# Patient Record
Sex: Male | Born: 1935 | Race: White | Hispanic: No | Smoking: Never smoker
Health system: Southern US, Community
[De-identification: ages and names within clinical notes are randomized; demographics above are authoritative.]

## PROBLEM LIST (undated history)

## (undated) DIAGNOSIS — H269 Unspecified cataract: Secondary | ICD-10-CM

## (undated) DIAGNOSIS — H409 Unspecified glaucoma: Secondary | ICD-10-CM

## (undated) DIAGNOSIS — C61 Malignant neoplasm of prostate: Secondary | ICD-10-CM

## (undated) DIAGNOSIS — R42 Dizziness and giddiness: Secondary | ICD-10-CM

## (undated) HISTORY — DX: Unspecified cataract: H26.9

## (undated) HISTORY — DX: Unspecified glaucoma: H40.9

## (undated) HISTORY — DX: Dizziness and giddiness: R42

## (undated) HISTORY — DX: Malignant neoplasm of prostate: C61

---

## 2005-05-29 HISTORY — PX: PROSTATE SURGERY: SHX751

## 2005-07-13 ENCOUNTER — Ambulatory Visit (HOSPITAL_COMMUNITY): Admission: RE | Admit: 2005-07-13 | Discharge: 2005-07-13 | Payer: Self-pay | Admitting: Urology

## 2005-08-21 ENCOUNTER — Ambulatory Visit: Admission: RE | Admit: 2005-08-21 | Discharge: 2005-11-19 | Payer: Self-pay | Admitting: Radiation Oncology

## 2005-09-20 ENCOUNTER — Encounter: Admission: RE | Admit: 2005-09-20 | Discharge: 2005-09-20 | Payer: Self-pay | Admitting: Urology

## 2005-09-27 ENCOUNTER — Ambulatory Visit (HOSPITAL_BASED_OUTPATIENT_CLINIC_OR_DEPARTMENT_OTHER): Admission: RE | Admit: 2005-09-27 | Discharge: 2005-09-27 | Payer: Self-pay | Admitting: Urology

## 2010-06-19 ENCOUNTER — Encounter: Payer: Self-pay | Admitting: Urology

## 2012-05-26 ENCOUNTER — Emergency Department (HOSPITAL_COMMUNITY)
Admission: EM | Admit: 2012-05-26 | Discharge: 2012-05-27 | Disposition: A | Discharge status: Home / Self Care | Payer: Medicare Other | Attending: Emergency Medicine | Admitting: Emergency Medicine

## 2012-05-26 ENCOUNTER — Emergency Department (HOSPITAL_COMMUNITY): Payer: Medicare Other

## 2012-05-26 DIAGNOSIS — W230XXA Caught, crushed, jammed, or pinched between moving objects, initial encounter: Secondary | ICD-10-CM | POA: Insufficient documentation

## 2012-05-26 DIAGNOSIS — S62639A Displaced fracture of distal phalanx of unspecified finger, initial encounter for closed fracture: Secondary | ICD-10-CM | POA: Insufficient documentation

## 2012-05-26 DIAGNOSIS — Z23 Encounter for immunization: Secondary | ICD-10-CM | POA: Insufficient documentation

## 2012-05-26 DIAGNOSIS — Y9389 Activity, other specified: Secondary | ICD-10-CM | POA: Insufficient documentation

## 2012-05-26 DIAGNOSIS — Y929 Unspecified place or not applicable: Secondary | ICD-10-CM | POA: Insufficient documentation

## 2012-05-26 DIAGNOSIS — S61209A Unspecified open wound of unspecified finger without damage to nail, initial encounter: Secondary | ICD-10-CM | POA: Insufficient documentation

## 2012-05-26 MED ORDER — TETANUS-DIPHTH-ACELL PERTUSSIS 5-2.5-18.5 LF-MCG/0.5 IM SUSP
0.5000 mL | Freq: Once | INTRAMUSCULAR | Status: AC
  Administered 2012-05-27: 0.5 mL via INTRAMUSCULAR
  Filled 2012-05-26: qty 0.5

## 2012-05-26 NOTE — ED Notes (Signed)
Pt states he slammed his 3rd finger on R hand in car door at 2045. Pt has lac to finger. Bleeding controlled.

## 2012-05-27 NOTE — ED Provider Notes (Signed)
History     CSN: [id number]  Arrival date & time 05/26/12  2203   First MD Initiated Contact with Patient 05/26/12 2239      Chief Complaint  Patient presents with  . Laceration    (Consider location/radiation/quality/duration/timing/severity/associated sxs/prior treatment) HPI Comments: Patient is a 76 year old male who presents with a right middle finger laceration that occurred earlier this evening when he slammed his finger in a car door by accident. Patient had immediate onset of dull, moderate pain to the affected finger that does not radiate. Pain is constant. Patient reports an associated laceration to the affected finger. Patient bandaged the finger. Denies numbness/tingling, weakness. No aggravating/alleviating factors.     No past medical history on file.  No past surgical history on file.  No family history on file.  History  Substance Use Topics  . Smoking status: Not on file  . Smokeless tobacco: Not on file  . Alcohol Use: Not on file      Review of Systems  Skin: Positive for wound.  All other systems reviewed and are negative.    Allergies  Review of patient's allergies indicates no known allergies.  Home Medications   Current Outpatient Rx  Name  Route  Sig  Dispense  Refill  . ALFALFA PO   Oral   Take 1 capsule by mouth every morning.         Marland Kitchen BRIMONIDINE TARTRATE-TIMOLOL 0.2-0.5 % OP SOLN   Both Eyes   Place 1 drop into both eyes every 12 (twelve) hours.         Marland Kitchen DANDELION PO   Oral   Take 1 capsule by mouth every morning.         Marland Kitchen GARLIC PO   Oral   Take 1 capsule by mouth 3 (three) times daily before meals.         Marland Kitchen MILK THISTLE PO   Oral   Take 1 capsule by mouth every morning.         . ADULT MULTIVITAMIN W/MINERALS CH   Oral   Take 1 tablet by mouth daily.         Marland Kitchen OVER THE COUNTER MEDICATION   Oral   Take 1 capsule by mouth every morning. OTC supplement Pyginal (antioxidant)         . OVER THE  COUNTER MEDICATION   Oral   Take 1 capsule by mouth every morning. OTC acai supplement         . OVER THE COUNTER MEDICATION   Oral   Take 1 capsule by mouth every morning. OTC tumeric supplement           BP 152/76  Pulse 72  Temp 98.4 F (36.9 C) (Oral)  Resp 16  SpO2 98%  Physical Exam  Nursing note and vitals reviewed. Constitutional: He appears well-developed and well-nourished. No distress.  HENT:  Head: Normocephalic and atraumatic.  Eyes: Conjunctivae normal are normal.  Neck: Normal range of motion. Neck supple.  Cardiovascular: Normal rate and regular rhythm.  Exam reveals no gallop and no friction rub.   No murmur heard. Pulmonary/Chest: Effort normal and breath sounds normal. He has no wheezes. He has no rales. He exhibits no tenderness.  Abdominal: Soft. There is no tenderness.  Musculoskeletal: Normal range of motion.       Full ROM of all joints of right middle finger.   Neurological: He is alert.       Strength and sensation intact  to right middle finger. Speech is goal-oriented. Moves limbs without ataxia.   Skin: Skin is warm and dry.       1.5cm laceration to volar aspect of right middle finger. Bleeding controlled.   Psychiatric: He has a normal mood and affect. His behavior is normal.    ED Course  Procedures (including critical care time)  LACERATION REPAIR Performed by: Emilia Beck Authorized by: Emilia Beck Consent: Verbal consent obtained. Risks and benefits: risks, benefits and alternatives were discussed Consent given by: patient Patient identity confirmed: provided demographic data Prepped and Draped in normal sterile fashion Wound explored  Laceration Location: right middle finger  Laceration Length: 1.5 cm  No Foreign Bodies seen or palpated  Anesthesia: local infiltration  Local anesthetic: lidocaine 2% without epinephrine  Anesthetic total: 1 ml  Irrigation method: syringe Amount of cleaning:  standard  Skin closure: 5-0 prolene  Number of sutures: 2  Technique: simple  Patient tolerance: Patient tolerated the procedure well with no immediate complications.   Labs Reviewed - No data to display Dg Finger Middle Right  05/26/2012  *RADIOLOGY REPORT*  Clinical Data: Shut finger in car door.  Finger pain and swelling.  RIGHT MIDDLE FINGER 2+V  Comparison: None.  Findings: Soft tissue swelling laceration is seen overlying the distal phalanx.  No radiopaque foreign body identified.  There is a linear lucency through the tuft of the distal phalanx seen only on the lateral projection, suspicious for nondisplaced fracture.  IMPRESSION: Probable nondisplaced fracture of the terminal tuft of the distal phalanx.   Original Report Authenticated By: Myles Rosenthal, M.D.      1. Closed fracture of tuft of distal phalanx of finger       MDM  12:08 AM Laceration repaired without difficulty. Terminal tuft fracture. Patient will have Hand follow up. Patient should return in 1 week for suture removal. No further evaluation needed at this time.        Emilia Beck, [place] 05/28/12 [phone]

## 2012-05-30 NOTE — ED Provider Notes (Signed)
Medical screening examination/treatment/procedure(s) were performed by non-physician practitioner and as supervising physician I was immediately available for consultation/collaboration.  Martha K Linker, MD 05/30/12 0705 

## 2013-04-09 ENCOUNTER — Ambulatory Visit (INDEPENDENT_AMBULATORY_CARE_PROVIDER_SITE_OTHER): Payer: Medicare Other | Admitting: Family Medicine

## 2013-04-09 VITALS — BP 168/70 | HR 71 | Temp 98.0°F | Resp 16 | Ht 70.0 in | Wt 177.0 lb

## 2013-04-09 DIAGNOSIS — E785 Hyperlipidemia, unspecified: Secondary | ICD-10-CM

## 2013-04-09 DIAGNOSIS — H409 Unspecified glaucoma: Secondary | ICD-10-CM

## 2013-04-09 LAB — LIPID PANEL
Cholesterol: 259 mg/dL — ABNORMAL HIGH (ref 0–200)
HDL: 44 mg/dL (ref >39)
LDL Cholesterol: 171 mg/dL — ABNORMAL HIGH (ref 0–99)
Total CHOL/HDL Ratio: 5.9 Ratio
Triglycerides: 218 mg/dL — ABNORMAL HIGH (ref <150)
VLDL: 44 mg/dL — ABNORMAL HIGH (ref 0–40)

## 2013-04-09 LAB — COMPREHENSIVE METABOLIC PANEL
ALT: 16 U/L (ref 0–53)
AST: 21 U/L (ref 0–37)
Albumin: 4.5 g/dL (ref 3.5–5.2)
Alkaline Phosphatase: 94 U/L (ref 39–117)
BUN: 10 mg/dL (ref 6–23)
CO2: 24 mEq/L (ref 19–32)
Calcium: 9.6 mg/dL (ref 8.4–10.5)
Chloride: 104 mEq/L (ref 96–112)
Creat: 0.84 mg/dL (ref 0.50–1.35)
Glucose, Bld: 109 mg/dL — ABNORMAL HIGH (ref 70–99)
Potassium: 3.9 mEq/L (ref 3.5–5.3)
Sodium: 138 mEq/L (ref 135–145)
Total Bilirubin: 0.8 mg/dL (ref 0.3–1.2)
Total Protein: 7.4 g/dL (ref 6.0–8.3)

## 2013-04-09 LAB — POCT CBC
Granulocyte percent: 63.5 %G (ref 37–80)
HCT, POC: 45.6 % (ref 43.5–53.7)
Hemoglobin: 14.3 g/dL (ref 14.1–18.1)
Lymph, poc: 2.2 (ref 0.6–3.4)
MCH, POC: 32 pg — AB (ref 27–31.2)
MCHC: 31.4 g/dL — AB (ref 31.8–35.4)
MCV: 102 fL — AB (ref 80–97)
MID (cbc): 0.5 (ref 0–0.9)
MPV: 9.1 fL (ref 0–99.8)
POC Granulocyte: 4.8 (ref 2–6.9)
POC LYMPH PERCENT: 29.9 %L (ref 10–50)
POC MID %: 6.6 %M (ref 0–12)
Platelet Count, POC: 204 10*3/uL (ref 142–424)
RBC: 4.47 M/uL — AB (ref 4.69–6.13)
RDW, POC: 14.3 %
WBC: 7.5 10*3/uL (ref 4.6–10.2)

## 2013-04-09 NOTE — Progress Notes (Signed)
@UMFCLOGO @  This chart was scribed for [name], MD by Quintella Reichert, ED scribe.  This patient was seen in room [facility] Room 9 and the patient's care was started at 9:42 AM.  Patient ID: Guy Hutchinson MRN: [id number], DOB: Feb 08, 1936, 77 y.o. Date of Encounter: 04/09/2013, 9:41 AM  Primary Physician: No PCP Per Patient  Chief Complaint: Requests CBC  HPI: 77 y.o. year old male with history below presents for a CBC.  He works part-time at [employer].  Pt states he wants to have his CBC checked as a regular check-up.  He does not have any complaints at this time.  He states he has been diagnosed with high cholesterol.  He only medicates regularly with "herbs."  He states he has been taking some stool softeners recently "maybe because I ate too much fiber."  He denies any other recent GI issues.  He has had a colonoscopy.     History reviewed. No pertinent past medical history.   Home Meds: Prior to Admission medications   Medication Sig Start Date End Date Taking? Authorizing Provider  ALFALFA PO Take 1 capsule by mouth every morning.   Yes Historical Provider, MD  brimonidine-timolol (COMBIGAN) 0.2-0.5 % ophthalmic solution Place 1 drop into both eyes every 12 (twelve) hours.   Yes Historical Provider, MD  DANDELION PO Take 1 capsule by mouth every morning.   Yes Historical Provider, MD  GARLIC PO Take 1 capsule by mouth 3 (three) times daily before meals.   Yes Historical Provider, MD  MILK THISTLE PO Take 1 capsule by mouth every morning.   Yes Historical Provider, MD  Multiple Vitamin (MULTIVITAMIN WITH MINERALS) TABS Take 1 tablet by mouth daily.   Yes Historical Provider, MD  OVER THE COUNTER MEDICATION Take 1 capsule by mouth every morning. OTC supplement Pyginal (antioxidant)   Yes Historical Provider, MD  OVER THE COUNTER MEDICATION Take 1 capsule by mouth every morning. OTC acai supplement   Yes Historical Provider, MD  OVER THE COUNTER MEDICATION Take 1 capsule  by mouth every morning. OTC tumeric supplement   Yes Historical Provider, MD    Allergies: No Known Allergies  History   Social History  . Marital Status: Married    Spouse Name: N/A    Number of Children: N/A  . Years of Education: N/A   Occupational History  . Not on file.   Social History Main Topics  . Smoking status: Never Smoker   . Smokeless tobacco: Not on file  . Alcohol Use: No  . Drug Use: No  . Sexual Activity: Not on file   Other Topics Concern  . Not on file   Social History Narrative  . No narrative on file     Review of Systems: Constitutional: negative for chills, fever, night sweats, weight changes, or fatigue  HEENT: negative for vision changes, hearing loss, congestion, rhinorrhea, ST, epistaxis, or sinus pressure Cardiovascular: negative for chest pain or palpitations Respiratory: negative for hemoptysis, wheezing, shortness of breath, or cough Abdominal: negative for abdominal pain, nausea, vomiting, diarrhea, or constipation Dermatological: negative for rash Neurologic: negative for headache, dizziness, or syncope All other systems reviewed and are otherwise negative with the exception to those above and in the HPI.   Physical Exam: Blood pressure 168/70, pulse 71, temperature 98 F (36.7 C), temperature source Oral, resp. rate 16, height 5\' 10"  (1.778 m), weight 177 lb (80.287 kg), SpO2 97.00%., Body mass index is 25.4 kg/(m^2). General: Well developed, well nourished, in  no acute distress. Head: Normocephalic, atraumatic, eyes without discharge, sclera non-icteric, nares are without discharge. Bilateral auditory canals clear, TM's are without perforation, pearly grey and translucent with reflective cone of light bilaterally. Oral cavity moist, posterior pharynx without exudate, erythema, peritonsillar abscess, or post nasal drip.  Neck: Supple. No thyromegaly. Full ROM. No lymphadenopathy. Lungs: Clear bilaterally to auscultation without  wheezes, rales, or rhonchi. Breathing is unlabored. Heart: RRR with S1 S2. No murmurs, rubs, or gallops appreciated. Abdomen: Soft, non-tender, non-distended with normoactive bowel sounds. No hepatomegaly. No rebound/guarding. No obvious abdominal masses. Msk:  Strength and tone normal for age. Extremities/Skin: Warm and dry. No clubbing or cyanosis. No edema. No rashes or suspicious lesions. Neuro: Alert and oriented X 3. Moves all extremities spontaneously. Gait is normal. CNII-XII grossly in tact. Psych:  Responds to questions appropriately with a normal affect.     ASSESSMENT AND PLAN:  77 y.o. year old male with Other and unspecified hyperlipidemia - Plan: POCT CBC, Comprehensive metabolic panel, Lipid panel  Glaucoma - Plan: POCT CBC, Comprehensive metabolic panel, Lipid panel     Signed, [name], MD 04/09/2013 9:41 AM

## 2013-04-10 ENCOUNTER — Encounter: Payer: Self-pay | Admitting: Family Medicine

## 2013-04-11 ENCOUNTER — Telehealth: Payer: Self-pay

## 2013-04-11 NOTE — Telephone Encounter (Signed)
I left the lyrics for the song in an envelope with all his lab results at the front desk

## 2013-04-11 NOTE — Telephone Encounter (Signed)
Patient has abnormal lab values. We need to reduce the cholesterol to reduce risk of vascular disease. Let me know if patient willing to start cholesterol lowering drug, or whether he prefers to work on weight and exercise, patient states he wants to work on diet/ exercise and he is going to resume his red yeast rice. He is asking for song lyrics you said you would print for him, old hymns I think

## 2013-04-11 NOTE — Telephone Encounter (Signed)
Pt is calling back from missed call from the lab about lab results Call back number is [phone]

## 2013-04-14 NOTE — Telephone Encounter (Signed)
Thanks. Left message to advise.

## 2014-01-15 ENCOUNTER — Ambulatory Visit (INDEPENDENT_AMBULATORY_CARE_PROVIDER_SITE_OTHER): Payer: Medicare Other

## 2014-01-15 ENCOUNTER — Ambulatory Visit (INDEPENDENT_AMBULATORY_CARE_PROVIDER_SITE_OTHER): Payer: Medicare Other | Admitting: Family Medicine

## 2014-01-15 VITALS — BP 172/72 | HR 70 | Temp 98.1°F | Resp 16 | Ht 68.0 in | Wt 172.8 lb

## 2014-01-15 DIAGNOSIS — M79661 Pain in right lower leg: Secondary | ICD-10-CM

## 2014-01-15 DIAGNOSIS — S8011XA Contusion of right lower leg, initial encounter: Secondary | ICD-10-CM

## 2014-01-15 DIAGNOSIS — S8010XA Contusion of unspecified lower leg, initial encounter: Secondary | ICD-10-CM

## 2014-01-15 DIAGNOSIS — M79609 Pain in unspecified limb: Secondary | ICD-10-CM

## 2014-01-15 NOTE — Patient Instructions (Addendum)
No fracture seen.  Swelling should improve with time. Return to the clinic or go to the nearest emergency room if any of your symptoms worsen or new symptoms occur.   Contusion A contusion is a deep bruise. Contusions are the result of an injury that caused bleeding under the skin. The contusion may turn blue, purple, or yellow. Minor injuries will give you a painless contusion, but more severe contusions may stay painful and swollen for a few weeks.  CAUSES  A contusion is usually caused by a blow, trauma, or direct force to an area of the body. SYMPTOMS   Swelling and redness of the injured area.  Bruising of the injured area.  Tenderness and soreness of the injured area.  Pain. DIAGNOSIS  The diagnosis can be made by taking a history and physical exam. An X-ray, CT scan, or MRI may be needed to determine if there were any associated injuries, such as fractures. TREATMENT  Specific treatment will depend on what area of the body was injured. In general, the best treatment for a contusion is resting, icing, elevating, and applying cold compresses to the injured area. Over-the-counter medicines may also be recommended for pain control. Ask your caregiver what the best treatment is for your contusion. HOME CARE INSTRUCTIONS   Put ice on the injured area.  Put ice in a plastic bag.  Place a towel between your skin and the bag.  Leave the ice on for 15-20 minutes, 3-4 times a day, or as directed by your health care provider.  Only take over-the-counter or prescription medicines for pain, discomfort, or fever as directed by your caregiver. Your caregiver may recommend avoiding anti-inflammatory medicines (aspirin, ibuprofen, and naproxen) for 48 hours because these medicines may increase bruising.  Rest the injured area.  If possible, elevate the injured area to reduce swelling. SEEK IMMEDIATE MEDICAL CARE IF:   You have increased bruising or swelling.  You have pain that is getting  worse.  Your swelling or pain is not relieved with medicines. MAKE SURE YOU:   Understand these instructions.  Will watch your condition.  Will get help right away if you are not doing well or get worse. Document Released: 02/22/2005 Document Revised: 05/20/2013 Document Reviewed: 03/20/2011 [facility] Patient Information 2015 [place], [place]. This information is not intended to replace advice given to you by your health care provider. Make sure you discuss any questions you have with your health care provider.

## 2014-01-15 NOTE — Progress Notes (Addendum)
Subjective:  This chart was scribed for Guy Ray, MD by Roxan Diesel, Scribe.  This patient was seen in [place] [facility] and the patient's care was started at 11:13 AM.   Patient ID: Guy Hutchinson, male    DOB: November 17, 1935, 78 y.o.   MRN: [id number]  Chief Complaint  Patient presents with  . Leg Injury    right lower leg, from tripping, edema, pain     HPI  Guy Hutchinson is a 78 y.o. male PCP: No PCP Per Patient   Pt presents for a right lower leg injury sustained 6 days ago.  He states he was coming off of a dance floor when he bumped his right lower leg against a ledge.  He stayed there and danced for another hour and placed ice on the leg when he got home and noticed pain and swelling to the area.  He states he has been using arnica gel and ice since then but his swelling and pain have persisted.  He states his swelling goes up and down.  He had some bruising earlier but it seems to have improved.  He is able to bear weight on the ankle.  He is not on blood-thinners.  There are no active problems to display for this patient.   No past medical history on file.  No past surgical history on file.  No Known Allergies  Prior to Admission medications   Medication Sig Start Date End Date Taking? Authorizing Provider  ALFALFA PO Take 1 capsule by mouth every morning.   Yes Historical Provider, MD  brimonidine-timolol (COMBIGAN) 0.2-0.5 % ophthalmic solution Place 1 drop into both eyes every 12 (twelve) hours.   Yes Historical Provider, MD  DANDELION PO Take 1 capsule by mouth every morning.   Yes Historical Provider, MD  GARLIC PO Take 1 capsule by mouth 3 (three) times daily before meals.   Yes Historical Provider, MD  MILK THISTLE PO Take 1 capsule by mouth every morning.   Yes Historical Provider, MD  Multiple Vitamin (MULTIVITAMIN WITH MINERALS) TABS Take 1 tablet by mouth daily.   Yes Historical Provider, MD  OVER THE COUNTER MEDICATION Take 1 capsule by mouth every  morning. OTC supplement Pyginal (antioxidant)   Yes Historical Provider, MD  OVER THE COUNTER MEDICATION Take 1 capsule by mouth every morning. OTC acai supplement   Yes Historical Provider, MD  OVER THE COUNTER MEDICATION Take 1 capsule by mouth every morning. OTC tumeric supplement   Yes Historical Provider, MD    History   Social History  . Marital Status: Married    Spouse Name: N/A    Number of Children: N/A  . Years of Education: N/A   Occupational History  . Not on file.   Social History Main Topics  . Smoking status: Never Smoker   . Smokeless tobacco: Not on file  . Alcohol Use: No  . Drug Use: No  . Sexual Activity: Not on file   Other Topics Concern  . Not on file   Social History Narrative  . No narrative on file     Review of Systems  Musculoskeletal: Positive for arthralgias (right ankle) and joint swelling (right ankle).  Hematological: Does not bruise/bleed easily.        Objective:   Physical Exam  Nursing note and vitals reviewed. Constitutional: He is oriented to person, place, and time. He appears well-developed and well-nourished. No distress.  HENT:  Head: Normocephalic and atraumatic.  Eyes:  Conjunctivae and EOM are normal.  Neck: Neck supple. No tracheal deviation present.  Cardiovascular: Normal rate.   Pulmonary/Chest: Effort normal. No respiratory distress.  Musculoskeletal: Normal range of motion.  Full ROM in right ankle.  No medial or lateral malleolar tenderness. Some tenderness along the distal fibula, with prominent soft tissue swelling at the distal third of the fibula.  Firm area about 10 cm up from the ankle with some faded ecchymosis distally.  NVI distally.  Neurological: He is alert and oriented to person, place, and time.  Skin: Skin is warm and dry.  Psychiatric: He has a normal mood and affect. His behavior is normal.     BP 172/72  Pulse 70  Temp(Src) 98.1 F (36.7 C) (Oral)  Resp 16  Ht 5\' 8"  (1.727 m)  Wt 172  lb 12.8 oz (78.382 kg)  BMI 26.28 kg/m2  SpO2 97%  [facility] reading (PRIMARY) by  Dr. Carlota Raspberry: R tib-fib:sts over distal 1/3rd fibula without apparent acute bony findings.       Assessment & Plan:  Guy Hutchinson is a 78 y.o. male Pain of right lower leg - Plan: DG Tibia/Fibula Right  Contusion of leg, right, initial encounter - Plan: DG Tibia/Fibula Right  Soft tissue contusion over distal leg, no pain with ambulation. Sx care discussed and rtc precautions given.    No orders of the defined types were placed in this encounter.   Patient Instructions  No fracture seen.  Swelling should improve with time. Return to the clinic or go to the nearest emergency room if any of your symptoms worsen or new symptoms occur.   Contusion A contusion is a deep bruise. Contusions are the result of an injury that caused bleeding under the skin. The contusion may turn blue, purple, or yellow. Minor injuries will give you a painless contusion, but more severe contusions may stay painful and swollen for a few weeks.  CAUSES  A contusion is usually caused by a blow, trauma, or direct force to an area of the body. SYMPTOMS   Swelling and redness of the injured area.  Bruising of the injured area.  Tenderness and soreness of the injured area.  Pain. DIAGNOSIS  The diagnosis can be made by taking a history and physical exam. An X-Hutchinson, CT scan, or MRI may be needed to determine if there were any associated injuries, such as fractures. TREATMENT  Specific treatment will depend on what area of the body was injured. In general, the best treatment for a contusion is resting, icing, elevating, and applying cold compresses to the injured area. Over-the-counter medicines may also be recommended for pain control. Ask your caregiver what the best treatment is for your contusion. HOME CARE INSTRUCTIONS   Put ice on the injured area.  Put ice in a plastic bag.  Place a towel between your skin and the  bag.  Leave the ice on for 15-20 minutes, 3-4 times a day, or as directed by your health care provider.  Only take over-the-counter or prescription medicines for pain, discomfort, or fever as directed by your caregiver. Your caregiver may recommend avoiding anti-inflammatory medicines (aspirin, ibuprofen, and naproxen) for 48 hours because these medicines may increase bruising.  Rest the injured area.  If possible, elevate the injured area to reduce swelling. SEEK IMMEDIATE MEDICAL CARE IF:   You have increased bruising or swelling.  You have pain that is getting worse.  Your swelling or pain is not relieved with medicines. MAKE SURE YOU:  Understand these instructions.  Will watch your condition.  Will get help right away if you are not doing well or get worse. Document Released: 02/22/2005 Document Revised: 05/20/2013 Document Reviewed: 03/20/2011 [facility] Patient Information 2015 [place], [place]. This information is not intended to replace advice given to you by your health care provider. Make sure you discuss any questions you have with your health care provider.        I personally performed the services described in this documentation, which was scribed in my presence. The recorded information has been reviewed and considered, and addended by me as needed.

## 2014-06-23 DIAGNOSIS — H4011X1 Primary open-angle glaucoma, mild stage: Secondary | ICD-10-CM | POA: Diagnosis not present

## 2014-09-25 DIAGNOSIS — H4011X1 Primary open-angle glaucoma, mild stage: Secondary | ICD-10-CM | POA: Diagnosis not present

## 2014-10-02 DIAGNOSIS — H1013 Acute atopic conjunctivitis, bilateral: Secondary | ICD-10-CM | POA: Diagnosis not present

## 2014-10-07 ENCOUNTER — Encounter: Payer: Self-pay | Admitting: *Deleted

## 2014-10-14 ENCOUNTER — Telehealth: Payer: Self-pay | Admitting: *Deleted

## 2014-10-14 NOTE — Telephone Encounter (Signed)
Patient phoned in response to my letter to schedule AWV.  Preferred for [facility] to be listed as his PCP (had also asked for PCP clarification).  Explained present circumstances and he said he had seen Dr. Carlota Raspberry as well in the past and said that was okay.  AWV scheduled, but patient stated he may go to walk in to see Dr. Joseph Art, if only to get his CBC drawn.  Patient knows to be fasting and plans (if he keeps the scheduled OV), to come early in the morning for labs and then come back.  If he goes to walk in, he will call me back and cancel OV with Carlota Raspberry.

## 2014-10-16 ENCOUNTER — Ambulatory Visit (INDEPENDENT_AMBULATORY_CARE_PROVIDER_SITE_OTHER): Payer: Medicare Other | Admitting: Family Medicine

## 2014-10-16 VITALS — BP 134/72 | HR 86 | Temp 98.1°F | Resp 17 | Ht 68.0 in | Wt 176.0 lb

## 2014-10-16 DIAGNOSIS — D7589 Other specified diseases of blood and blood-forming organs: Secondary | ICD-10-CM

## 2014-10-16 DIAGNOSIS — H6523 Chronic serous otitis media, bilateral: Secondary | ICD-10-CM | POA: Diagnosis not present

## 2014-10-16 LAB — POCT CBC
Granulocyte percent: 68.4 %G (ref 37–80)
HCT, POC: 45.6 % (ref 43.5–53.7)
Hemoglobin: 15.2 g/dL (ref 14.1–18.1)
Lymph, poc: 2.7 (ref 0.6–3.4)
MCH, POC: 31.7 pg — AB (ref 27–31.2)
MCHC: 33.4 g/dL (ref 31.8–35.4)
MCV: 94.7 fL (ref 80–97)
MID (cbc): 0.9 (ref 0–0.9)
MPV: 7.1 fL (ref 0–99.8)
POC Granulocyte: 7.9 — AB (ref 2–6.9)
POC LYMPH PERCENT: 23.6 %L (ref 10–50)
POC MID %: 8 %M (ref 0–12)
Platelet Count, POC: 274 10*3/uL (ref 142–424)
RBC: 4.82 M/uL (ref 4.69–6.13)
RDW, POC: 13.5 %
WBC: 11.5 10*3/uL — AB (ref 4.6–10.2)

## 2014-10-16 MED ORDER — AMOXICILLIN 875 MG PO TABS: 875.0000 mg | ORAL_TABLET | Freq: Two times a day (BID) | ORAL | Status: DC

## 2014-10-16 NOTE — Progress Notes (Signed)
Subjective:    Patient ID: Guy Hutchinson, male    DOB: 1936-02-19, 79 y.o.   MRN: [id number] This chart was scribed for Robyn Haber, MD by Steva Colder, ED Scribe. The patient was seen in room 2 at 11:44 AM.   Chief Complaint  Patient presents with   cbc     yearly check     HPI  Guy Hutchinson is a 79 y.o. male who presents today complaining of a yearly check up. Pt reports that he began to have allergies and the ear pain began this morning. He states that he is having associated symptoms of ear pain and popping in the right ear. He states that he has tried hydrogen peroxide with no relief for his symptoms. He denies SOB, abdominal pain, and any other symtpoms.     There are no active problems to display for this patient.  History reviewed. No pertinent past medical history. Past Surgical History  Procedure Laterality Date   Prostate surgery  2007   No Known Allergies Prior to Admission medications   Medication Sig Start Date End Date Taking? Authorizing Provider  ALFALFA PO Take 1 capsule by mouth every morning.   Yes Historical Provider, MD  brimonidine-timolol (COMBIGAN) 0.2-0.5 % ophthalmic solution Place 1 drop into both eyes every 12 (twelve) hours.   Yes Historical Provider, MD  DANDELION PO Take 1 capsule by mouth every morning.   Yes Historical Provider, MD  GARLIC PO Take 1 capsule by mouth 3 (three) times daily before meals.   Yes Historical Provider, MD  MILK THISTLE PO Take 1 capsule by mouth every morning.   Yes Historical Provider, MD  Multiple Vitamin (MULTIVITAMIN WITH MINERALS) TABS Take 1 tablet by mouth daily.   Yes Historical Provider, MD  OVER THE COUNTER MEDICATION Take 1 capsule by mouth every morning. OTC supplement Pyginal (antioxidant)   Yes Historical Provider, MD  OVER THE COUNTER MEDICATION Take 1 capsule by mouth every morning. OTC acai supplement   Yes Historical Provider, MD  OVER THE COUNTER MEDICATION Take 1 capsule by mouth every  morning. OTC tumeric supplement   Yes Historical Provider, MD      Review of Systems  Constitutional: Negative for fever and chills.  HENT: Positive for ear pain. Negative for ear discharge.   Respiratory: Negative for shortness of breath.   Gastrointestinal: Negative for abdominal pain.       Objective:   Physical Exam  Constitutional: He is oriented to person, place, and time. He appears well-developed and well-nourished. No distress.  HENT:  Head: Normocephalic and atraumatic.  Both ears are opaque and wrinkled.  Eyes: EOM are normal.  Neck: Neck supple. No tracheal deviation present.  Cardiovascular: Normal rate, regular rhythm and normal heart sounds.  Exam reveals no gallop and no friction rub.   No murmur heard. Pulmonary/Chest: Effort normal and breath sounds normal. No respiratory distress. He has no wheezes. He has no rales.  Musculoskeletal: Normal range of motion.  Neurological: He is alert and oriented to person, place, and time.  Skin: Skin is warm and dry.  Psychiatric: He has a normal mood and affect. His behavior is normal.  Nursing note and vitals reviewed.        BP 134/72 mmHg   Pulse 86   Temp(Src) 98.1 F (36.7 C) (Oral)   Resp 17   Ht 5\' 8"  (1.727 m)   Wt 176 lb (79.833 kg)   BMI 26.77 kg/m2   SpO2  96%  Assessment & Plan:  DIAGNOSTIC STUDIES: Oxygen Saturation is 96% on RA, nl by my interpretation.    COORDINATION OF CARE: 11:50 AM-Discussed treatment plan which includes amoxicillin Rx with pt at bedside and pt agreed to plan.   This chart was scribed in my presence and reviewed by me personally.    ICD-9-CM ICD-10-CM   1. Macrocytosis without anemia 289.89 D75.89 POCT CBC     amoxicillin (AMOXIL) 875 MG tablet     CANCELED: Vitamin B12     CANCELED: Folate  2. Bilateral chronic serous otitis media 381.10 H65.23 amoxicillin (AMOXIL) 875 MG tablet     Signed, Robyn Haber, MD

## 2014-10-17 ENCOUNTER — Encounter: Payer: Self-pay | Admitting: Family Medicine

## 2014-10-17 LAB — VITAMIN B12: Vitamin B-12: 804 pg/mL (ref 211–911)

## 2014-10-17 LAB — FOLATE: Folate: >20 ng/mL

## 2014-10-22 ENCOUNTER — Telehealth: Payer: Self-pay

## 2014-10-22 ENCOUNTER — Other Ambulatory Visit: Payer: Self-pay | Admitting: Family Medicine

## 2014-10-22 DIAGNOSIS — E785 Hyperlipidemia, unspecified: Secondary | ICD-10-CM

## 2014-10-22 DIAGNOSIS — Z0000 Encounter for general adult medical examination without abnormal findings: Secondary | ICD-10-CM

## 2014-10-22 DIAGNOSIS — N401 Enlarged prostate with lower urinary tract symptoms: Secondary | ICD-10-CM

## 2014-10-22 DIAGNOSIS — R351 Nocturia: Secondary | ICD-10-CM

## 2014-10-22 DIAGNOSIS — H9190 Unspecified hearing loss, unspecified ear: Secondary | ICD-10-CM

## 2014-10-22 NOTE — Telephone Encounter (Signed)
Please run pended orders which EPIC will not let me sign.

## 2014-10-22 NOTE — Telephone Encounter (Signed)
Pt was wanting to know if we could check PSA, Lipid and CMP. When he came in to see you, he wanted a complete blood check, not just a blood count. Can we do a blood draw only

## 2014-10-23 ENCOUNTER — Telehealth: Payer: Self-pay

## 2014-10-23 NOTE — Telephone Encounter (Signed)
Pt states he was called in regards to lab results on 5/21, but he was really interested in his CBC results, which were not discussed. He states he has attempted to lower this with diet for the last year, and would really like to know what the result of that was. Please advise patient

## 2014-10-24 NOTE — Telephone Encounter (Signed)
Future orders signed. LMOM letting pt know he could come back in for a blood draw only. (see other phone message)

## 2014-10-26 ENCOUNTER — Ambulatory Visit (INDEPENDENT_AMBULATORY_CARE_PROVIDER_SITE_OTHER): Payer: Medicare Other | Admitting: Family Medicine

## 2014-10-26 VITALS — BP 142/78 | HR 75 | Temp 97.2°F | Resp 16 | Ht 68.5 in | Wt 178.0 lb

## 2014-10-26 DIAGNOSIS — R351 Nocturia: Secondary | ICD-10-CM

## 2014-10-26 DIAGNOSIS — H9190 Unspecified hearing loss, unspecified ear: Secondary | ICD-10-CM | POA: Diagnosis not present

## 2014-10-26 DIAGNOSIS — J301 Allergic rhinitis due to pollen: Secondary | ICD-10-CM | POA: Diagnosis not present

## 2014-10-26 DIAGNOSIS — N401 Enlarged prostate with lower urinary tract symptoms: Secondary | ICD-10-CM | POA: Diagnosis not present

## 2014-10-26 DIAGNOSIS — Z0000 Encounter for general adult medical examination without abnormal findings: Secondary | ICD-10-CM

## 2014-10-26 DIAGNOSIS — E785 Hyperlipidemia, unspecified: Secondary | ICD-10-CM | POA: Diagnosis not present

## 2014-10-26 DIAGNOSIS — H6506 Acute serous otitis media, recurrent, bilateral: Secondary | ICD-10-CM

## 2014-10-26 LAB — POCT CBC
GRANULOCYTE PERCENT: 57.6 % (ref 37–80)
HCT, POC: 43.4 % — AB (ref 43.5–53.7)
HEMOGLOBIN: 14.5 g/dL (ref 14.1–18.1)
Lymph, poc: 2.4 (ref 0.6–3.4)
MCH, POC: 31.7 pg — AB (ref 27–31.2)
MCHC: 33.5 g/dL (ref 31.8–35.4)
MCV: 94.6 fL (ref 80–97)
MID (cbc): 0.7 (ref 0–0.9)
MPV: 7.3 fL (ref 0–99.8)
POC Granulocyte: 4.3 (ref 2–6.9)
POC LYMPH PERCENT: 32.9 %L (ref 10–50)
POC MID %: 9.5 % (ref 0–12)
Platelet Count, POC: 238 10*3/uL (ref 142–424)
RBC: 4.59 M/uL — AB (ref 4.69–6.13)
RDW, POC: 13.6 %
WBC: 7.4 10*3/uL (ref 4.6–10.2)

## 2014-10-26 LAB — LIPID PANEL
Cholesterol: 229 mg/dL — ABNORMAL HIGH (ref 0–200)
HDL: 44 mg/dL (ref >=40)
LDL Cholesterol: 149 mg/dL — ABNORMAL HIGH (ref 0–99)
Total CHOL/HDL Ratio: 5.2 Ratio
Triglycerides: 178 mg/dL — ABNORMAL HIGH (ref <150)
VLDL: 36 mg/dL (ref 0–40)

## 2014-10-26 LAB — COMPLETE METABOLIC PANEL WITH GFR
ALT: 17 U/L (ref 0–53)
AST: 23 U/L (ref 0–37)
Albumin: 4.2 g/dL (ref 3.5–5.2)
Alkaline Phosphatase: 84 U/L (ref 39–117)
BUN: 10 mg/dL (ref 6–23)
CO2: 23 mEq/L (ref 19–32)
Calcium: 8.9 mg/dL (ref 8.4–10.5)
Chloride: 105 mEq/L (ref 96–112)
Creat: 0.84 mg/dL (ref 0.50–1.35)
GFR, Est African American: >89 mL/min
GFR, Est Non African American: 84 mL/min
Glucose, Bld: 117 mg/dL — ABNORMAL HIGH (ref 70–99)
Potassium: 4.3 mEq/L (ref 3.5–5.3)
Sodium: 137 mEq/L (ref 135–145)
Total Bilirubin: 0.6 mg/dL (ref 0.2–1.2)
Total Protein: 6.8 g/dL (ref 6.0–8.3)

## 2014-10-26 LAB — PSA, MEDICARE: PSA: 0.07 ng/mL (ref <=4.00)

## 2014-10-26 NOTE — Progress Notes (Signed)
Patient ID: Guy Hutchinson, male   DOB: January 10, 1936, 79 y.o.   MRN: [id number]   Subjective:  This chart was scribed for Reginia Forts, MD by [facility], medical scribe at [facility].The patient was seen in exam room 04 and the patient's care was started at 10:03 AM.   Patient ID: Guy Hutchinson, male    DOB: [birth year], 79 y.o.   MRN: [id number]  10/26/2014  needs lab work and Follow-up  HPI HPI Comments: Guy Hutchinson is a 79 y.o. male who presents to [facility] for a follow up of B otitis media serous and lab work. He was seen 10 days ago by Dr. Joseph Art and prescribed Amoxicillin. Right ear pain has improved, not as much popping. Pain subsided about two days after being seen by Dr. Joseph Art. First year of allergies; no sneezing but his eyes are itching and watery. He denies fever, chills, diaphoresis, rhinorrhea, congestion, or cough.  Energy level is good.   Presenting for lab work; to undergo CBC, CMET, PSA, FLP.   Review of Systems  Constitutional: Negative for fever, chills, diaphoresis, appetite change and fatigue.  HENT: Negative for congestion, ear pain, mouth sores, postnasal drip, rhinorrhea, sinus pressure, sore throat, trouble swallowing and voice change.   Eyes: Positive for discharge and itching. Negative for visual disturbance.  Respiratory: Negative for cough, chest tightness, shortness of breath and wheezing.   Cardiovascular: Negative for chest pain.  Gastrointestinal: Negative for nausea, vomiting, abdominal pain, diarrhea and abdominal distention.  Endocrine: Negative for polydipsia, polyphagia and polyuria.  Genitourinary: Negative for dysuria, frequency and hematuria.  Musculoskeletal: Negative for gait problem.  Skin: Negative for color change, pallor and rash.  Allergic/Immunologic: Positive for environmental allergies.  Neurological: Negative for dizziness, syncope, light-headedness and headaches.    Hematological: Does not bruise/bleed easily.  Psychiatric/Behavioral: Negative for behavioral problems and confusion.    History reviewed. No pertinent past medical history. Past Surgical History  Procedure Laterality Date  . Prostate surgery  2007   No Known Allergies Current Outpatient Prescriptions  Medication Sig Dispense Refill  . ALFALFA PO Take 1 capsule by mouth every morning.    Marland Kitchen DANDELION PO Take 1 capsule by mouth every morning.    Marland Kitchen GARLIC PO Take 1 capsule by mouth 3 (three) times daily before meals.    Marland Kitchen MILK THISTLE PO Take 1 capsule by mouth every morning.    . Multiple Vitamin (MULTIVITAMIN WITH MINERALS) TABS Take 1 tablet by mouth daily.    Marland Kitchen OVER THE COUNTER MEDICATION Take 1 capsule by mouth every morning. OTC supplement Pyginal (antioxidant)    . OVER THE COUNTER MEDICATION Take 1 capsule by mouth every morning. OTC acai supplement    . OVER THE COUNTER MEDICATION Take 1 capsule by mouth every morning. OTC tumeric supplement    . brimonidine-timolol (COMBIGAN) 0.2-0.5 % ophthalmic solution Place 1 drop into both eyes every 12 (twelve) hours.     No current facility-administered medications for this visit.      Objective:    BP 142/78 mmHg  Pulse 75  Temp(Src) 97.2 F (36.2 C) (Oral)  Resp 16  Ht 5' 8.5" (1.74 m)  Wt 178 lb (80.74 kg)  BMI 26.67 kg/m2  SpO2 96% Physical Exam  Constitutional: He is oriented to person, place, and time. He appears well-developed and well-nourished. No distress.  HENT:  Head: Normocephalic and atraumatic.  Right Ear: External ear normal.  Left Ear:  External ear normal.  Nose: Nose normal.  Mouth/Throat: Oropharynx is clear and moist.  Eyes: Conjunctivae and EOM are normal. Pupils are equal, round, and reactive to light.  Neck: Normal range of motion. Neck supple. Carotid bruit is not present. No thyromegaly present.  Cardiovascular: Normal rate, regular rhythm, normal heart sounds and intact distal pulses.  Exam  reveals no gallop and no friction rub.   No murmur heard. Pulmonary/Chest: Effort normal and breath sounds normal. He has no wheezes. He has no rales.  Lymphadenopathy:    He has no cervical adenopathy.  Neurological: He is alert and oriented to person, place, and time. No cranial nerve deficit.  Skin: Skin is warm and dry. No rash noted. He is not diaphoretic.  Psychiatric: He has a normal mood and affect. His behavior is normal.  Nursing note and vitals reviewed.  Results for orders placed or performed in visit on 10/26/14  POCT CBC  Result Value Ref Range   WBC 7.4 4.6 - 10.2 K/uL   Lymph, poc 2.4 0.6 - 3.4   POC LYMPH PERCENT 32.9 10 - 50 %L   MID (cbc) 0.7 0 - 0.9   POC MID % 9.5 0 - 12 %M   POC Granulocyte 4.3 2 - 6.9   Granulocyte percent 57.6 37 - 80 %G   RBC 4.59 (A) 4.69 - 6.13 M/uL   Hemoglobin 14.5 14.1 - 18.1 g/dL   HCT, POC 43.4 (A) 43.5 - 53.7 %   MCV 94.6 80 - 97 fL   MCH, POC 31.7 (A) 27 - 31.2 pg   MCHC 33.5 31.8 - 35.4 g/dL   RDW, POC 13.6 %   Platelet Count, POC 238 142 - 424 K/uL   MPV 7.3 0 - 99.8 fL      Assessment & Plan:   1. Recurrent acute serous otitis media of both ears   2. Medicare annual wellness visit, subsequent   3. BPH associated with nocturia   4. Hyperlipidemia   5. Hearing impaired, unspecified laterality   6. Allergic rhinitis due to pollen    -Improved otitis media; asymptomatic. -continue allergy eye drop per ophthalmology. -Presenting for labs related to recent annual wellness exam.   No orders of the defined types were placed in this encounter.    No Follow-up on file.   I personally performed the services described in this documentation, which was scribed in my presence. The recorded information has been reviewed and considered.  Guy Hutchinson Elayne Guerin, M.D. [facility] [address] [place], [place]  [address] [phone] phone [phone] fax

## 2014-10-26 NOTE — Patient Instructions (Signed)

## 2014-10-28 ENCOUNTER — Encounter: Payer: Self-pay | Admitting: Family Medicine

## 2014-12-28 ENCOUNTER — Encounter: Payer: Self-pay | Admitting: Family Medicine

## 2015-01-06 ENCOUNTER — Encounter: Payer: Self-pay | Admitting: *Deleted

## 2015-02-19 DIAGNOSIS — H4011X1 Primary open-angle glaucoma, mild stage: Secondary | ICD-10-CM | POA: Diagnosis not present

## 2015-02-26 ENCOUNTER — Telehealth: Payer: Self-pay | Admitting: Family Medicine

## 2015-02-26 NOTE — Telephone Encounter (Signed)
Patient called to schedule appointment with Dr. Tamala Julian for a annual wellness exam.  We scheduled it for May 31, 2015 at 915 with Dr. Tamala Julian.

## 2015-05-26 ENCOUNTER — Encounter: Payer: Self-pay | Admitting: Family Medicine

## 2015-05-26 ENCOUNTER — Ambulatory Visit (INDEPENDENT_AMBULATORY_CARE_PROVIDER_SITE_OTHER): Payer: Medicare Other | Admitting: Family Medicine

## 2015-05-26 VITALS — BP 148/94 | HR 78 | Temp 97.9°F | Resp 16 | Ht 68.5 in | Wt 179.0 lb

## 2015-05-26 DIAGNOSIS — Z0000 Encounter for general adult medical examination without abnormal findings: Secondary | ICD-10-CM

## 2015-05-26 DIAGNOSIS — Z8546 Personal history of malignant neoplasm of prostate: Secondary | ICD-10-CM

## 2015-05-26 DIAGNOSIS — Z13220 Encounter for screening for lipoid disorders: Secondary | ICD-10-CM

## 2015-05-26 DIAGNOSIS — E785 Hyperlipidemia, unspecified: Secondary | ICD-10-CM | POA: Diagnosis not present

## 2015-05-26 LAB — LIPID PANEL
CHOL/HDL RATIO: 5.3 ratio — AB (ref <=5.0)
CHOLESTEROL: 246 mg/dL — AB (ref 125–200)
HDL: 46 mg/dL (ref >=40)
LDL Cholesterol: 170 mg/dL — ABNORMAL HIGH (ref <130)
Triglycerides: 149 mg/dL (ref <150)
VLDL: 30 mg/dL (ref <30)

## 2015-05-26 NOTE — Progress Notes (Signed)
[facility] [address] [place] [address] [phone]  Date:  05/26/2015   Name:  Guy Hutchinson   DOB:  Dec 06, 1935   MRN:  [id number]  PCP:  Wendie Agreste, MD    Chief Complaint: Annual Exam   History of Present Illness:  Guy Hutchinson is a 79 y.o. very pleasant male patient who presents with the following:  Generally healthy older gentleman here today seeking a CPE.  [employer] has "been bothering the heck out of me" about getting a wellness exam.  He otherwise does not have any concerns  Tetanus shot is UTD Flu shot done this year already: Colonoscopy: he is not sure quite when he had his last screening but thinks it was 10 years ago.  However he declines a repeat at this time  He declines pneumonia shot today  He is feeling well overall States that his BP was 130/70 yesterday at [employer].  His BP is always ok when he checks it- he tends to have "white coat syndrome" per his report  History of prostate cancer 2007- he sees Dutch Gray for his checks- most recent PSA looked fine.    He is fasting today Patient Active Problem List   Diagnosis Date Noted  . Hearing impaired 10/22/2014    No past medical history on file.  Past Surgical History  Procedure Laterality Date  . Prostate surgery  2007    Social History  Substance Use Topics  . Smoking status: Never Smoker   . Smokeless tobacco: Not on file  . Alcohol Use: No    No family history on file.  No Known Allergies  Medication list has been reviewed and updated.  Current Outpatient Prescriptions on File Prior to Visit  Medication Sig Dispense Refill  . ALFALFA PO Take 1 capsule by mouth every morning.    . brimonidine-timolol (COMBIGAN) 0.2-0.5 % ophthalmic solution Place 1 drop into both eyes every 12 (twelve) hours.    Marland Kitchen DANDELION PO Take 1 capsule by mouth every morning.    Marland Kitchen GARLIC PO Take 1 capsule by mouth 3 (three) times daily before meals.    Marland Kitchen MILK  THISTLE PO Take 1 capsule by mouth every morning.    . Multiple Vitamin (MULTIVITAMIN WITH MINERALS) TABS Take 1 tablet by mouth daily.    Marland Kitchen OVER THE COUNTER MEDICATION Take 1 capsule by mouth every morning. OTC supplement Pyginal (antioxidant)    . OVER THE COUNTER MEDICATION Take 1 capsule by mouth every morning. OTC acai supplement    . OVER THE COUNTER MEDICATION Take 1 capsule by mouth every morning. OTC tumeric supplement     No current facility-administered medications on file prior to visit.    Review of Systems:  As per HPI- otherwise negative.   Physical Examination: Filed Vitals:   05/26/15 1258  BP: 150/90  Pulse: 78  Temp: 97.9 F (36.6 C)  Resp: 16   Filed Vitals:   05/26/15 1258  Height: 5' 8.5" (1.74 m)  Weight: 179 lb (81.194 kg)   Body mass index is 26.82 kg/(m^2). Ideal Body Weight: Weight in (lb) to have BMI = 25: 166.5  GEN: WDWN, NAD, Non-toxic, A & O x 3, looks well HEENT: Atraumatic, Normocephalic. Neck supple. No masses, No LAD.  Bilateral TM wnl, oropharynx normal.  PEERL,EOMI.   Ears and Nose: No external deformity. CV: RRR, No M/G/R. No JVD. No thrill. No extra heart sounds. PULM: CTA B, no  wheezes, crackles, rhonchi. No retractions. No resp. distress. No accessory muscle use. ABD: S, NT, ND EXTR: No c/c/e NEURO Normal gait.  PSYCH: Normally interactive. Conversant. Not depressed or anxious appearing.  Calm demeanor.    Assessment and Plan: Physical exam  Screening for hyperlipidemia - Plan: Lipid panel  History of prostate cancer  CPE today- no concerns Labs pending as above  Signed Lamar Blinks, MD

## 2015-05-26 NOTE — Patient Instructions (Signed)
It was good to see you today- I will be in touch with your labs asap! Continue to keep an eye on your blood pressure- if you start to get readings higher than 140/90 please let me know

## 2015-05-27 ENCOUNTER — Encounter: Payer: Self-pay | Admitting: Family Medicine

## 2015-05-31 ENCOUNTER — Encounter: Payer: Self-pay | Admitting: Family Medicine

## 2015-06-11 DIAGNOSIS — H401131 Primary open-angle glaucoma, bilateral, mild stage: Secondary | ICD-10-CM | POA: Diagnosis not present

## 2015-06-14 ENCOUNTER — Other Ambulatory Visit: Payer: Self-pay | Admitting: Family Medicine

## 2015-08-05 DIAGNOSIS — D225 Melanocytic nevi of trunk: Secondary | ICD-10-CM | POA: Diagnosis not present

## 2015-08-05 DIAGNOSIS — L821 Other seborrheic keratosis: Secondary | ICD-10-CM | POA: Diagnosis not present

## 2015-08-05 DIAGNOSIS — D485 Neoplasm of uncertain behavior of skin: Secondary | ICD-10-CM | POA: Diagnosis not present

## 2015-08-05 DIAGNOSIS — C4442 Squamous cell carcinoma of skin of scalp and neck: Secondary | ICD-10-CM | POA: Diagnosis not present

## 2015-08-05 DIAGNOSIS — L570 Actinic keratosis: Secondary | ICD-10-CM | POA: Diagnosis not present

## 2015-08-05 DIAGNOSIS — L812 Freckles: Secondary | ICD-10-CM | POA: Diagnosis not present

## 2015-09-22 DIAGNOSIS — C4442 Squamous cell carcinoma of skin of scalp and neck: Secondary | ICD-10-CM | POA: Diagnosis not present

## 2015-11-03 DIAGNOSIS — H401131 Primary open-angle glaucoma, bilateral, mild stage: Secondary | ICD-10-CM | POA: Diagnosis not present

## 2015-12-08 ENCOUNTER — Ambulatory Visit (INDEPENDENT_AMBULATORY_CARE_PROVIDER_SITE_OTHER): Payer: Medicare Other | Admitting: Family Medicine

## 2015-12-08 VITALS — BP 158/88 | HR 78 | Temp 98.5°F | Resp 16 | Ht 69.0 in | Wt 178.0 lb

## 2015-12-08 DIAGNOSIS — E785 Hyperlipidemia, unspecified: Secondary | ICD-10-CM

## 2015-12-08 DIAGNOSIS — Z131 Encounter for screening for diabetes mellitus: Secondary | ICD-10-CM

## 2015-12-08 DIAGNOSIS — R739 Hyperglycemia, unspecified: Secondary | ICD-10-CM | POA: Diagnosis not present

## 2015-12-08 DIAGNOSIS — Z862 Personal history of diseases of the blood and blood-forming organs and certain disorders involving the immune mechanism: Secondary | ICD-10-CM

## 2015-12-08 DIAGNOSIS — H9202 Otalgia, left ear: Secondary | ICD-10-CM | POA: Diagnosis not present

## 2015-12-08 LAB — COMPLETE METABOLIC PANEL WITH GFR
ALK PHOS: 84 U/L (ref 40–115)
ALT: 15 U/L (ref 9–46)
AST: 24 U/L (ref 10–35)
Albumin: 4 g/dL (ref 3.6–5.1)
BUN: 8 mg/dL (ref 7–25)
CALCIUM: 9.1 mg/dL (ref 8.6–10.3)
CHLORIDE: 102 mmol/L (ref 98–110)
CO2: 25 mmol/L (ref 20–31)
CREATININE: 0.88 mg/dL (ref 0.70–1.18)
GFR, Est African American: >89 mL/min (ref >=60)
GFR, Est Non African American: 82 mL/min (ref >=60)
Glucose, Bld: 106 mg/dL — ABNORMAL HIGH (ref 65–99)
Potassium: 4.3 mmol/L (ref 3.5–5.3)
Sodium: 137 mmol/L (ref 135–146)
Total Bilirubin: 0.9 mg/dL (ref 0.2–1.2)
Total Protein: 6.7 g/dL (ref 6.1–8.1)

## 2015-12-08 LAB — CBC
HEMATOCRIT: 43.8 % (ref 38.5–50.0)
Hemoglobin: 14.5 g/dL (ref 13.2–17.1)
MCH: 32.4 pg (ref 27.0–33.0)
MCHC: 33.1 g/dL (ref 32.0–36.0)
MCV: 97.8 fL (ref 80.0–100.0)
MPV: 9.7 fL (ref 7.5–12.5)
Platelets: 212 10*3/uL (ref 140–400)
RBC: 4.48 MIL/uL (ref 4.20–5.80)
RDW: 13.3 % (ref 11.0–15.0)
WBC: 7.6 10*3/uL (ref 3.8–10.8)

## 2015-12-08 LAB — LIPID PANEL
CHOL/HDL RATIO: 5.8 ratio — AB (ref <=5.0)
Cholesterol: 233 mg/dL — ABNORMAL HIGH (ref 125–200)
HDL: 40 mg/dL (ref >=40)
LDL Cholesterol: 145 mg/dL — ABNORMAL HIGH (ref <130)
Triglycerides: 242 mg/dL — ABNORMAL HIGH (ref <150)
VLDL: 48 mg/dL — ABNORMAL HIGH (ref <30)

## 2015-12-08 NOTE — Progress Notes (Addendum)
Subjective:  By signing my name below, I, Moises Blood, attest that this documentation has been prepared under the direction and in the presence of Merri Ray, MD. Electronically Signed: Moises Blood, [place]. 12/08/2015 , 9:14 AM .  Patient was seen in Room 6 .   Patient ID: Guy Hutchinson, male    DOB: [birth year], 80 y.o.   MRN: [id number] Chief Complaint  Patient presents with  . labwork    pt wants a CBC done    HPI Guy Hutchinson is a 80 y.o. male Here for blood work. He was last seen for physical in December by Dr. Lorelei Pont. He has h/o prostate cancer in 2007, followed by Dr. Alinda Money.   HLD Lab Results  Component Value Date   CHOL 246* 05/26/2015   HDL 46 05/26/2015   LDLCALC 170* 05/26/2015   TRIG 149 05/26/2015   CHOLHDL 5.3* 05/26/2015   CBC He did have elevated WBC on May 20th 2016, but was normal last checked on May 30th 2016. He also MCV in 2014 but this has been normal since.   Hyperglycemia His blood sugar was slightly elevated a year ago. He denies history of diabetes. He's changed his diet slightly with oat bran cereal.   Ear popping He also mentions intermittent popping in his ears, and "it just feels funny today". He's been applying peroxide in his ears. He denies any recent fevers. He denies ear pain.   Patient Active Problem List   Diagnosis Date Noted  . Hearing impaired 10/22/2014   Past Medical History  Diagnosis Date  . Cancer ([place])   . Cataract    Past Surgical History  Procedure Laterality Date  . Prostate surgery  2007   No Known Allergies Prior to Admission medications   Medication Sig Start Date End Date Taking? Authorizing Provider  ALFALFA PO Take 1 capsule by mouth every morning.   Yes Historical Provider, MD  brimonidine-timolol (COMBIGAN) 0.2-0.5 % ophthalmic solution Place 1 drop into both eyes every 12 (twelve) hours.   Yes Historical Provider, MD  DANDELION PO Take 1 capsule by mouth every morning.   Yes Historical  Provider, MD  GARLIC PO Take 1 capsule by mouth 3 (three) times daily before meals.   Yes Historical Provider, MD  MILK THISTLE PO Take 1 capsule by mouth every morning.   Yes Historical Provider, MD  Multiple Vitamin (MULTIVITAMIN WITH MINERALS) TABS Take 1 tablet by mouth daily.   Yes Historical Provider, MD  OVER THE COUNTER MEDICATION Take 1 capsule by mouth every morning. OTC supplement Pyginal (antioxidant)   Yes Historical Provider, MD  OVER THE COUNTER MEDICATION Take 1 capsule by mouth every morning. OTC acai supplement   Yes Historical Provider, MD  OVER THE COUNTER MEDICATION Take 1 capsule by mouth every morning. OTC tumeric supplement   Yes Historical Provider, MD   Social History   Social History  . Marital Status: Divorced    Spouse Name: N/A  . Number of Children: N/A  . Years of Education: N/A   Occupational History  . Retired    Social History Main Topics  . Smoking status: Never Smoker   . Smokeless tobacco: Never Used  . Alcohol Use: 3.0 - 4.2 oz/week    5-7 Standard drinks or equivalent per week  . Drug Use: No  . Sexual Activity: Not on file   Other Topics Concern  . Not on file   Social History Narrative   Divorced.  Review of Systems  Constitutional: Negative for fever, chills and fatigue.  HENT: Negative for congestion, ear pain, hearing loss and sinus pressure.   Respiratory: Negative for cough, shortness of breath and wheezing.   Gastrointestinal: Negative for nausea, vomiting and diarrhea.       Objective:   Physical Exam  Constitutional: He is oriented to person, place, and time. He appears well-developed and well-nourished.  HENT:  Head: Normocephalic and atraumatic.  Right Ear: Tympanic membrane, external ear and ear canal normal. Tympanic membrane is not erythematous. No middle ear effusion.  Left Ear: Tympanic membrane, external ear and ear canal normal. Tympanic membrane is not erythematous.  No middle ear effusion.  Nose: No  rhinorrhea.  Mouth/Throat: Oropharynx is clear and moist and mucous membranes are normal. No oropharyngeal exudate or posterior oropharyngeal erythema.  Ear canals are clear, no significant mid-ear effusion, erythema or wounds  Eyes: Conjunctivae are normal. Pupils are equal, round, and reactive to light.  Neck: Neck supple.  Cardiovascular: Normal rate, regular rhythm, normal heart sounds and intact distal pulses.   No murmur heard. Pulmonary/Chest: Effort normal and breath sounds normal. He has no wheezes. He has no rhonchi. He has no rales.  Abdominal: Soft. There is no tenderness.  Lymphadenopathy:    He has no cervical adenopathy.  Neurological: He is alert and oriented to person, place, and time.  Skin: Skin is warm and dry. No rash noted.  Psychiatric: He has a normal mood and affect. His behavior is normal.  Vitals reviewed.   Filed Vitals:   12/08/15 0846  BP: 158/88  Pulse: 78  Temp: 98.5 F (36.9 C)  TempSrc: Oral  Resp: 16  Height: 5\' 9"  (1.753 m)  Weight: 178 lb (80.74 kg)  SpO2: 97%      Assessment & Plan:   Guy Hutchinson is a 80 y.o. male Hyperglycemia - Plan: COMPLETE METABOLIC PANEL WITH GFR, Hemoglobin A1c Screening for diabetes mellitus - Plan: COMPLETE METABOLIC PANEL WITH GFR, Hemoglobin A1c  -Check A1c, CMP.  Hyperlipidemia - Plan: COMPLETE METABOLIC PANEL WITH GFR, Lipid panel  - Repeat lipids, but at 80, could have discussion regarding benefits versus risk of statins if elevated.  History of leukocytosis - Plan: CBC  - patient  requests repeat CBC, most recent blood counts were normal.  Ear pain, left  - No concerning findings on exam currently. Symptoms sound like serous otitis, but improved now. If symptoms return, return to clinic to discuss other medications, but would hold on antihistamines at this time due to anticholinergic effects and his age.  No orders of the defined types were placed in this encounter.   Patient Instructions        IF you received an x-ray today, you will receive an invoice from [facility]. Please contact [facility] at [phone] with questions or concerns regarding your invoice.   IF you received labwork today, you will receive an invoice from [employer]. Please contact [facility] at [phone] with questions or concerns regarding your invoice.   Our billing staff will not be able to assist you with questions regarding bills from these companies.  You will be contacted with the lab results as soon as they are available. The fastest way to get your results is to activate your My Chart account. Instructions are located on the last page of this paperwork. If you have not heard from [place] regarding the results in 2 weeks, please contact this office.    I  will check your blood count, kidney and liver tests, diabetes tests, and cholesterol test. We will let you know about those results, and if any follow-up needed. If you have any further ear pain, popping, or clicking, return to discuss this further, as this can be a sign of fluid in the ears at times. Avoid Q-tips or other objects inside your ears for now. Follow-up in 6 months for Medicare physical.       I personally performed the services described in this documentation, which was scribed in my presence. The recorded information has been reviewed and considered, and addended by me as needed.   Signed,   Merri Ray, MD [facility].  12/08/2015 9:17 AM

## 2015-12-08 NOTE — Patient Instructions (Signed)
     IF you received an x-ray today, you will receive an invoice from [facility]. Please contact [facility] at [phone] with questions or concerns regarding your invoice.   IF you received labwork today, you will receive an invoice from [employer]. Please contact [facility] at [phone] with questions or concerns regarding your invoice.   Our billing staff will not be able to assist you with questions regarding bills from these companies.  You will be contacted with the lab results as soon as they are available. The fastest way to get your results is to activate your My Chart account. Instructions are located on the last page of this paperwork. If you have not heard from [place] regarding the results in 2 weeks, please contact this office.    I will check your blood count, kidney and liver tests, diabetes tests, and cholesterol test. We will let you know about those results, and if any follow-up needed. If you have any further ear pain, popping, or clicking, return to discuss this further, as this can be a sign of fluid in the ears at times. Avoid Q-tips or other objects inside your ears for now. Follow-up in 6 months for Medicare physical.

## 2015-12-09 LAB — HEMOGLOBIN A1C
HEMOGLOBIN A1C: 5.5 % (ref <5.7)
MEAN PLASMA GLUCOSE: 111 mg/dL

## 2015-12-17 ENCOUNTER — Telehealth: Payer: Self-pay

## 2015-12-17 NOTE — Telephone Encounter (Signed)
Pt came to the office today looking for lab results when you get a chance please review

## 2015-12-17 NOTE — Telephone Encounter (Signed)
Lab notes sent

## 2015-12-18 ENCOUNTER — Ambulatory Visit (INDEPENDENT_AMBULATORY_CARE_PROVIDER_SITE_OTHER): Payer: Medicare Other

## 2015-12-18 ENCOUNTER — Ambulatory Visit (INDEPENDENT_AMBULATORY_CARE_PROVIDER_SITE_OTHER): Payer: Medicare Other | Admitting: Emergency Medicine

## 2015-12-18 ENCOUNTER — Ambulatory Visit: Payer: Medicare Other

## 2015-12-18 VITALS — BP 178/84 | HR 88 | Temp 98.1°F | Resp 16 | Ht 69.0 in | Wt 182.6 lb

## 2015-12-18 DIAGNOSIS — S82201A Unspecified fracture of shaft of right tibia, initial encounter for closed fracture: Secondary | ICD-10-CM

## 2015-12-18 DIAGNOSIS — S8000XA Contusion of unspecified knee, initial encounter: Secondary | ICD-10-CM

## 2015-12-18 DIAGNOSIS — S8010XA Contusion of unspecified lower leg, initial encounter: Secondary | ICD-10-CM

## 2015-12-18 DIAGNOSIS — S8011XA Contusion of right lower leg, initial encounter: Secondary | ICD-10-CM

## 2015-12-18 DIAGNOSIS — S8001XA Contusion of right knee, initial encounter: Secondary | ICD-10-CM

## 2015-12-18 DIAGNOSIS — S82191A Other fracture of upper end of right tibia, initial encounter for closed fracture: Secondary | ICD-10-CM | POA: Diagnosis not present

## 2015-12-18 DIAGNOSIS — R030 Elevated blood-pressure reading, without diagnosis of hypertension: Secondary | ICD-10-CM

## 2015-12-18 DIAGNOSIS — IMO0001 Reserved for inherently not codable concepts without codable children: Secondary | ICD-10-CM | POA: Insufficient documentation

## 2015-12-18 NOTE — Progress Notes (Addendum)
Patient ID: Guy Hutchinson, male   DOB: 1936/04/01, 80 y.o.   MRN: [id number]    By signing my name below, I, [name], attest that this documentation has been prepared under the direction and in the presence of Darlyne Russian, MD Electronically Signed: Ladene Artist, ED Scribe 12/18/2015 at 1:05 PM.  Chief Complaint:  Chief Complaint  Patient presents with  . Leg Injury    Right leg   HPI: Guy Hutchinson is a 80 y.o. male who reports to [facility] today complaining of a right leg injury sustained 6 days ago. Pt states that he was trying to find the exit of his new church in a dark hallway when he fell. Pt reports striking his right cal on a landing outdoors. He reports mild pain to the area that is exacerbated with palpation and movement. Pt also notes a large bruise to the area. He has tried applying muscle therapy gel to the area for the past 6 days.   Past Medical History  Diagnosis Date  . Cancer ([place])   . Cataract    Past Surgical History  Procedure Laterality Date  . Prostate surgery  2007   Social History   Social History  . Marital Status: Divorced    Spouse Name: N/A  . Number of Children: N/A  . Years of Education: N/A   Occupational History  . Retired    Social History Main Topics  . Smoking status: Never Smoker   . Smokeless tobacco: Never Used  . Alcohol Use: 3.0 - 4.2 oz/week    5-7 Standard drinks or equivalent per week  . Drug Use: No  . Sexual Activity: Not Asked   Other Topics Concern  . None   Social History Narrative   Divorced.    Family History  Problem Relation Age of Onset  . Diabetes Mother   . Heart disease Brother    No Known Allergies Prior to Admission medications   Medication Sig Start Date End Date Taking? Authorizing Provider  ALFALFA PO Take 1 capsule by mouth every morning.   Yes Historical Provider, MD  brimonidine-timolol (COMBIGAN) 0.2-0.5 % ophthalmic solution Place 1 drop into both eyes every 12 (twelve) hours.   Yes  Historical Provider, MD  DANDELION PO Take 1 capsule by mouth every morning.   Yes Historical Provider, MD  GARLIC PO Take 1 capsule by mouth 3 (three) times daily before meals.   Yes Historical Provider, MD  MILK THISTLE PO Take 1 capsule by mouth every morning.   Yes Historical Provider, MD  Multiple Vitamin (MULTIVITAMIN WITH MINERALS) TABS Take 1 tablet by mouth daily.   Yes Historical Provider, MD  OVER THE COUNTER MEDICATION Take 1 capsule by mouth every morning. OTC supplement Pyginal (antioxidant)   Yes Historical Provider, MD  OVER THE COUNTER MEDICATION Take 1 capsule by mouth every morning. OTC acai supplement   Yes Historical Provider, MD  OVER THE COUNTER MEDICATION Take 1 capsule by mouth every morning. OTC tumeric supplement   Yes Historical Provider, MD   ROS: The patient denies fevers, chills, night sweats, unintentional weight loss, chest pain, palpitations, wheezing, dyspnea on exertion, nausea, vomiting, abdominal pain, dysuria, hematuria, melena, numbness, weakness, or tingling.   All other systems have been reviewed and were otherwise negative with the exception of those mentioned in the HPI and as above.    PHYSICAL EXAM: Filed Vitals:   12/18/15 1100 12/18/15 1216  BP: 182/92 178/84  Pulse: 88  Temp: 98.1 F (36.7 C)   Resp: 16    Body mass index is 26.95 kg/(m^2).  General: Alert, no acute distress HEENT:  Normocephalic, atraumatic, oropharynx patent. Eye: Juliette Mangle [facility] Cardiovascular:  Regular rate and rhythm, no rubs murmurs or gallops. No Carotid bruits, radial pulse intact. No pedal edema.  Respiratory: Clear to auscultation bilaterally. No wheezes, rales, or rhonchi. No cyanosis, no use of accessory musculature Abdominal: No organomegaly, abdomen is soft and non-tender, positive bowel sounds. No masses. Musculoskeletal: Gait intact. No edema. Prominent tibial tuberosity. Significant R calf varicosities. Achilles intact. R leg: Large bruise over the medial  calf Skin: No rashes.  Neurologic: Facial musculature symmetric. Psychiatric: Patient acts appropriately throughout our interaction. Lymphatic: No cervical or submandibular lymphadenopathy  LABS:  EKG/XRAY:   Primary read interpreted by Dr. Everlene Farrier at [facility]. Dg Tibia/fibula Right  12/18/2015  CLINICAL DATA:  Initial encounter for Fell pain and bruising rt knee and lower leg EXAM: RIGHT TIBIA AND FIBULA - 2 VIEW COMPARISON:  01/15/2014 FINDINGS: Degenerative change in about the knee. A subtle transverse fracture is identified about the proximal tibia. No intra-articular extension. No significant displacement. IMPRESSION: Transverse fracture of the proximal tibia. Electronically Signed   By: Abigail Miyamoto M.D.   On: 12/18/2015 12:56   ASSESSMENT/PLAN:   Patient has a transverse fracture of the proximal tibia. He will be placed in a knee immobilizer will use a cane for support and follow-up with [facility].I personally performed the services described in this documentation, which was scribed in my presence. The recorded information has been reviewed and is accurate. Patient not interested in taking medication at home for blood pressure. He will continue his herbal supplements.  Gross sideeffects, risk and benefits, and alternatives of medications d/w patient. Patient is aware that all medications have potential sideeffects and we are unable to predict every sideeffect or drug-drug interaction that may occur.  Arlyss Queen MD 12/18/2015 11:12 AM

## 2015-12-18 NOTE — Patient Instructions (Addendum)
Please wear your support when you are up. Please use either a cane or a walker for support. You should here on Monday about your appointment to follow-up with the orthopedist.    IF you received an x-ray today, you will receive an invoice from [facility]. Please contact [facility] at [phone] with questions or concerns regarding your invoice.   IF you received labwork today, you will receive an invoice from [employer]. Please contact [facility] at [phone] with questions or concerns regarding your invoice.   Our billing staff will not be able to assist you with questions regarding bills from these companies.  You will be contacted with the lab results as soon as they are available. The fastest way to get your results is to activate your My Chart account. Instructions are located on the last page of this paperwork. If you have not heard from [place] regarding the results in 2 weeks, please contact this office.   Tibial and Fibular Fracture, Adult Tibial and fibular fracture is a break in the bones of your lower leg (tibia and fibula). The tibia is the larger of these two bones. The fibula is the smaller of the two bones. It is on the outer side of your leg.  CAUSES  Low-energy injuries, such as a fall from ground level.  High-energy injuries, such as motor vehicle injuries, gunshot wounds, or high-speed sports collisions. RISK FACTORS  Jumping activities.  Repetitive stress, such as long-distance running.  Participation in sports.  Osteoporosis.  Advanced age. SIGNS AND SYMPTOMS  Pain.  Swelling.  Inability to put weight on your injured leg.  Bone deformities at the site of your injury.  Bruising. DIAGNOSIS  Tibial and fibular fractures are diagnosed with the use of X-ray exams. TREATMENT  If you have a simple fracture of these two bones, they can be treated with simple immobilization. A cast or splint will be used on your  leg to keep it from moving while it heals. Then you can begin range-of-motion exercises to regain your knee motion. HOME CARE INSTRUCTIONS   Apply ice to your leg:  Put ice in a plastic bag.  Place a towel between your skin and the bag.  Leave the ice on for 20 minutes, 2-3 times a day.  If you have a plaster or fiberglass cast:  Do not try to scratch the skin under the cast using sharp or pointed objects.  Check the skin around the cast every day. You may put lotion on any red or sore areas.  Keep your cast dry and clean.  If you have a plaster splint:  Wear the splint as directed.  You may loosen the elastic around the splint if your toes become numb, tingle, or turn cold or blue.  Do not put pressure on any part of your cast or splint until it is fully hardened, because it may deform.  Your cast or splint can be protected during bathing with a plastic bag. Do not lower the cast or splint into water.  Use crutches as directed.  Only take over-the-counter or prescription medicines for pain, discomfort, or fever as directed by your health care provider.  Follow all instructions given to you by your health care provider.  Make and keep all follow-up appointments. SEEK MEDICAL CARE IF:  Your pain is becoming worse rather than better or is not controlled with medicines.  You have increased swelling or redness in the foot.  You begin to lose feeling in  your foot or toes. SEEK IMMEDIATE MEDICAL CARE IF:  You develop a cold or blue foot or toes on the injured side.  You develop severe pain in your injured leg, especially if the pain is increased with movement of your toes. MAKE SURE YOU:  Understand these instructions.  Will watch your condition.  Will get help right away if you are not doing well or get worse.   This information is not intended to replace advice given to you by your health care provider. Make sure you discuss any questions you have with your health  care provider.   Document Released: 02/04/2002 Document Revised: 09/29/2014 Document Reviewed: 12/25/2012 Elsevier Interactive Patient Education [employer].

## 2015-12-21 ENCOUNTER — Telehealth: Payer: Self-pay | Admitting: Emergency Medicine

## 2015-12-21 NOTE — Telephone Encounter (Signed)
-----   Message from Wendie Agreste, MD sent at 12/17/2015  2:51 PM EDT ----- Call patient. 3 month blood sugar test was normal, does not indicate diabetes or prediabetes. Other electrolytes in normal range. Cholesterol was slightly elevated, but appears to be stable and possibly slightly improved from 6 months ago. Would not necessarily recommend any statin medicine or other new medication at this time. Let me know if there are questions.

## 2015-12-22 DIAGNOSIS — S82191A Other fracture of upper end of right tibia, initial encounter for closed fracture: Secondary | ICD-10-CM | POA: Diagnosis not present

## 2015-12-22 DIAGNOSIS — M25561 Pain in right knee: Secondary | ICD-10-CM | POA: Diagnosis not present

## 2016-01-19 DIAGNOSIS — S82191D Other fracture of upper end of right tibia, subsequent encounter for closed fracture with routine healing: Secondary | ICD-10-CM | POA: Diagnosis not present

## 2016-02-10 DIAGNOSIS — S82191D Other fracture of upper end of right tibia, subsequent encounter for closed fracture with routine healing: Secondary | ICD-10-CM | POA: Diagnosis not present

## 2016-03-03 DIAGNOSIS — H401131 Primary open-angle glaucoma, bilateral, mild stage: Secondary | ICD-10-CM | POA: Diagnosis not present

## 2016-06-27 DIAGNOSIS — H401131 Primary open-angle glaucoma, bilateral, mild stage: Secondary | ICD-10-CM | POA: Diagnosis not present

## 2016-07-12 DIAGNOSIS — H401131 Primary open-angle glaucoma, bilateral, mild stage: Secondary | ICD-10-CM | POA: Diagnosis not present

## 2016-07-19 DIAGNOSIS — H401131 Primary open-angle glaucoma, bilateral, mild stage: Secondary | ICD-10-CM | POA: Diagnosis not present

## 2016-07-21 DIAGNOSIS — Z8546 Personal history of malignant neoplasm of prostate: Secondary | ICD-10-CM | POA: Insufficient documentation

## 2016-07-21 DIAGNOSIS — C61 Malignant neoplasm of prostate: Secondary | ICD-10-CM | POA: Diagnosis not present

## 2016-08-07 DIAGNOSIS — H401131 Primary open-angle glaucoma, bilateral, mild stage: Secondary | ICD-10-CM | POA: Diagnosis not present

## 2016-10-03 DIAGNOSIS — H0015 Chalazion left lower eyelid: Secondary | ICD-10-CM | POA: Diagnosis not present

## 2016-11-01 DIAGNOSIS — H401131 Primary open-angle glaucoma, bilateral, mild stage: Secondary | ICD-10-CM | POA: Diagnosis not present

## 2016-11-13 DIAGNOSIS — H401131 Primary open-angle glaucoma, bilateral, mild stage: Secondary | ICD-10-CM | POA: Diagnosis not present

## 2016-11-17 ENCOUNTER — Encounter: Payer: Self-pay | Admitting: Family Medicine

## 2016-11-17 ENCOUNTER — Ambulatory Visit (INDEPENDENT_AMBULATORY_CARE_PROVIDER_SITE_OTHER): Payer: Medicare Other | Admitting: Family Medicine

## 2016-11-17 VITALS — BP 138/82 | HR 68 | Temp 98.0°F | Resp 18 | Ht 68.11 in | Wt 180.0 lb

## 2016-11-17 DIAGNOSIS — E785 Hyperlipidemia, unspecified: Secondary | ICD-10-CM | POA: Diagnosis not present

## 2016-11-17 DIAGNOSIS — Z862 Personal history of diseases of the blood and blood-forming organs and certain disorders involving the immune mechanism: Secondary | ICD-10-CM

## 2016-11-17 DIAGNOSIS — R739 Hyperglycemia, unspecified: Secondary | ICD-10-CM | POA: Diagnosis not present

## 2016-11-17 NOTE — Patient Instructions (Addendum)
Let me know if you change your mind about pneumonia vaccine, and shingles vaccines.    I will check a blood sugar test, cholesterol tests and electrolutes today.  Follow up as scheduled with your urologist.    IF you received an x-ray today, you will receive an invoice from [facility]. Please contact [facility] Radiology at [phone] with questions or concerns regarding your invoice.   IF you received labwork today, you will receive an invoice from [place]. Please contact [facility] at [phone] with questions or concerns regarding your invoice.   Our billing staff will not be able to assist you with questions regarding bills from these companies.  You will be contacted with the lab results as soon as they are available. The fastest way to get your results is to activate your My Chart account. Instructions are located on the last page of this paperwork. If you have not heard from [place] regarding the results in 2 weeks, please contact this office.

## 2016-11-17 NOTE — Progress Notes (Signed)
Subjective:  By signing my name below, I, [name], attest that this documentation has been prepared under the direction and in the presence of Wendie Agreste, MD Electronically Signed: Ladene Artist, ED Scribe 11/17/2016 at 9:58 AM.   Patient ID: Guy Hutchinson, male    DOB: 11-26-1935, 81 y.o.   MRN: [id number]  Chief Complaint  Patient presents with  . Hyperlipidemia    follow up    HPI Guy Hutchinson is a 81 y.o. male who presents to Primary Care at [facility] for follow-up for hyperlipidemia.   Hyperglycemia A1C was normal in July 2017.   Hyperlipidemia Lab Results  Component Value Date   CHOL 233 (H) 12/08/2015   HDL 40 12/08/2015   LDLCALC 145 (H) 12/08/2015   TRIG 242 (H) 12/08/2015   CHOLHDL 5.8 (H) 12/08/2015    Lab Results  Component Value Date   ALT 15 12/08/2015   AST 24 12/08/2015   ALKPHOS 84 12/08/2015   BILITOT 0.9 12/08/2015  Decided against medication at that time as somewhat improved from previous readings. Advised to continue diet and exercise. Pt plays golf twice/week.  Leukocytosis  Elevated WBC. Normal on testing last year.   Immunizations  Immunization History  Administered Date(s) Administered  . Influenza-Unspecified 02/26/2015  . Tdap 05/27/2012  Pt declines pneumonia and shingles vaccine at this visit.  Patient Active Problem List   Diagnosis Date Noted  . Elevated blood pressure 12/18/2015  . Hearing impaired 10/22/2014   Past Medical History:  Diagnosis Date  . Cancer ([place])   . Cataract    Past Surgical History:  Procedure Laterality Date  . PROSTATE SURGERY  2007   No Known Allergies Prior to Admission medications   Medication Sig Start Date End Date Taking? Authorizing Provider  ALFALFA PO Take 1 capsule by mouth every morning.   Yes [provider]  brimonidine-timolol (COMBIGAN) 0.2-0.5 % ophthalmic solution Place 1 drop into both eyes every 12 (twelve) hours.   Yes [provider]    DANDELION PO Take 1 capsule by mouth every morning.   Yes [provider]  GARLIC PO Take 1 capsule by mouth 3 (three) times daily before meals.   Yes [provider]  MILK THISTLE PO Take 1 capsule by mouth every morning.   Yes [provider]  Multiple Vitamin (MULTIVITAMIN WITH MINERALS) TABS Take 1 tablet by mouth daily.   Yes [provider]  OVER THE COUNTER MEDICATION Take 1 capsule by mouth every morning. OTC supplement Pyginal (antioxidant)   Yes [provider]  OVER THE COUNTER MEDICATION Take 1 capsule by mouth every morning. OTC acai supplement   Yes [provider]  OVER THE COUNTER MEDICATION Take 1 capsule by mouth every morning. OTC tumeric supplement   Yes [provider]   Social History   Social History  . Marital status: Divorced    Spouse name: N/A  . Number of children: N/A  . Years of education: N/A   Occupational History  . Retired    Social History Main Topics  . Smoking status: Never Smoker  . Smokeless tobacco: Never Used  . Alcohol use 3.0 - 4.2 oz/week    5 - 7 Standard drinks or equivalent per week  . Drug use: No  . Sexual activity: Not on file   Other Topics Concern  . Not on file   Social History Narrative   Divorced.    Review of Systems  Constitutional: Negative  for fatigue and unexpected weight change.  Eyes: Negative for visual disturbance.  Respiratory: Negative for cough, chest tightness and shortness of breath.   Cardiovascular: Negative for chest pain, palpitations and leg swelling.  Gastrointestinal: Negative for abdominal pain and blood in stool.  Neurological: Negative for dizziness, light-headedness and headaches.      Objective:   Physical Exam  Constitutional: He is oriented to person, place, and time. He appears well-developed and well-nourished.  HENT:  Head: Normocephalic and atraumatic.  Eyes: EOM are normal. Pupils are equal, round, and reactive to  light.  Neck: No JVD present. Carotid bruit is not present.  Cardiovascular: Normal rate, regular rhythm and normal heart sounds.   No murmur heard. Pulmonary/Chest: Effort normal and breath sounds normal. He has no rales.  Musculoskeletal: He exhibits no edema.  Neurological: He is alert and oriented to person, place, and time.  Skin: Skin is warm and dry.  Psychiatric: He has a normal mood and affect.  Vitals reviewed.  Vitals:   11/17/16 0934  BP: 138/82  Pulse: 68  Resp: 18  Temp: 98 F (36.7 C)  TempSrc: Oral  SpO2: 98%  Weight: 180 lb (81.6 kg)  Height: 5' 8.11" (1.73 m)      Assessment & Plan:   Guy Hutchinson is a 81 y.o. male Hyperlipidemia, unspecified hyperlipidemia type - Plan: Lipid panel  - Not on statin. Continuing to watch diet, activity, increase exercise recommended. We'll check lipids, but at his age would not necessarily recommend statin.  History of anemia - Plan: CBC  - Normal last year, he requests another CBC today. CBC obtained.  Hyperglycemia - Plan: Hemoglobin A1c, Comprehensive metabolic panel  - Mild hyperglycemia previously. A1c okay last year. Check A1c, CMP again.  No orders of the defined types were placed in this encounter.  Patient Instructions   Let me know if you change your mind about pneumonia vaccine, and shingles vaccines.    I will check a blood sugar test, cholesterol tests and electrolutes today.  Follow up as scheduled with your urologist.    IF you received an x-ray today, you will receive an invoice from [facility]. Please contact [facility]iology at [phone] with questions or concerns regarding your invoice.   IF you received labwork today, you will receive an invoice from [employer]. Please contact [facility] at [phone] with questions or concerns regarding your invoice.   Our billing staff will not be able to assist you with questions regarding bills from these companies.  You will be contacted  with the lab results as soon as they are available. The fastest way to get your results is to activate your My Chart account. Instructions are located on the last page of this paperwork. If you have not heard from [place] regarding the results in 2 weeks, please contact this office.       I personally performed the services described in this documentation, which was scribed in my presence. The recorded information has been reviewed and considered for accuracy and completeness, addended by me as needed, and agree with information above.  Signed,   Merri Ray, MD Primary Care at [place].  11/17/16 10:13 AM

## 2016-11-18 LAB — CBC
HEMATOCRIT: 44.1 % (ref 37.5–51.0)
HEMOGLOBIN: 15.3 g/dL (ref 13.0–17.7)
MCH: 33.5 pg — ABNORMAL HIGH (ref 26.6–33.0)
MCHC: 34.7 g/dL (ref 31.5–35.7)
MCV: 97 fL (ref 79–97)
Platelets: 229 10*3/uL (ref 150–379)
RBC: 4.57 x10E6/uL (ref 4.14–5.80)
RDW: 13.7 % (ref 12.3–15.4)
WBC: 8.8 10*3/uL (ref 3.4–10.8)

## 2016-11-18 LAB — COMPREHENSIVE METABOLIC PANEL
A/G RATIO: 1.5 (ref 1.2–2.2)
ALBUMIN: 4.3 g/dL (ref 3.5–4.8)
ALK PHOS: 88 IU/L (ref 39–117)
ALT: 15 IU/L (ref 0–44)
AST: 21 IU/L (ref 0–40)
BILIRUBIN TOTAL: 0.8 mg/dL (ref 0.0–1.2)
BUN / CREAT RATIO: 7 — AB (ref 10–24)
BUN: 7 mg/dL — AB (ref 8–27)
CHLORIDE: 99 mmol/L (ref 96–106)
CO2: 20 mmol/L (ref 20–29)
Calcium: 9.4 mg/dL (ref 8.6–10.2)
Creatinine, Ser: 0.96 mg/dL (ref 0.76–1.27)
GFR calc non Af Amer: 75 mL/min/{1.73_m2} (ref >59)
GFR, EST AFRICAN AMERICAN: 87 mL/min/{1.73_m2} (ref >59)
GLOBULIN, TOTAL: 2.9 g/dL (ref 1.5–4.5)
Glucose: 103 mg/dL — ABNORMAL HIGH (ref 65–99)
POTASSIUM: 4 mmol/L (ref 3.5–5.2)
SODIUM: 135 mmol/L (ref 134–144)
TOTAL PROTEIN: 7.2 g/dL (ref 6.0–8.5)

## 2016-11-18 LAB — LIPID PANEL
Chol/HDL Ratio: 5.9 ratio — ABNORMAL HIGH (ref 0.0–5.0)
Cholesterol, Total: 241 mg/dL — ABNORMAL HIGH (ref 100–199)
HDL: 41 mg/dL (ref >39)
LDL Calculated: 147 mg/dL — ABNORMAL HIGH (ref 0–99)
Triglycerides: 263 mg/dL — ABNORMAL HIGH (ref 0–149)
VLDL Cholesterol Cal: 53 mg/dL — ABNORMAL HIGH (ref 5–40)

## 2016-11-18 LAB — HEMOGLOBIN A1C
Est. average glucose Bld gHb Est-mCnc: 114 mg/dL
Hgb A1c MFr Bld: 5.6 % (ref 4.8–5.6)

## 2016-11-27 ENCOUNTER — Encounter: Payer: Self-pay | Admitting: *Deleted

## 2017-01-09 DIAGNOSIS — H401132 Primary open-angle glaucoma, bilateral, moderate stage: Secondary | ICD-10-CM | POA: Diagnosis not present

## 2017-01-10 ENCOUNTER — Encounter: Payer: Self-pay | Admitting: Family Medicine

## 2017-01-10 ENCOUNTER — Ambulatory Visit (INDEPENDENT_AMBULATORY_CARE_PROVIDER_SITE_OTHER): Payer: Medicare Other | Admitting: Family Medicine

## 2017-01-10 VITALS — BP 158/80 | HR 72 | Temp 97.8°F | Resp 16 | Ht 67.32 in | Wt 176.0 lb

## 2017-01-10 DIAGNOSIS — G4489 Other headache syndrome: Secondary | ICD-10-CM | POA: Diagnosis not present

## 2017-01-10 DIAGNOSIS — E7800 Pure hypercholesterolemia, unspecified: Secondary | ICD-10-CM

## 2017-01-10 DIAGNOSIS — R42 Dizziness and giddiness: Secondary | ICD-10-CM

## 2017-01-10 LAB — POCT URINALYSIS DIP (MANUAL ENTRY)
Bilirubin, UA: NEGATIVE
GLUCOSE UA: NEGATIVE mg/dL
Ketones, POC UA: NEGATIVE mg/dL
Leukocytes, UA: NEGATIVE
NITRITE UA: NEGATIVE
PH UA: 7 (ref 5.0–8.0)
PROTEIN UA: NEGATIVE mg/dL
RBC UA: NEGATIVE
SPEC GRAV UA: 1.01 (ref 1.010–1.025)
UROBILINOGEN UA: 0.2 U/dL

## 2017-01-10 NOTE — Progress Notes (Signed)
Subjective:    Patient ID: Guy Hutchinson, male    DOB: [birth year], 81 y.o.   MRN: [id number]  01/10/2017  Dizziness (x 3 days)   HPI   This 81 y.o. male presents for evaluation of dizziness.  Woke up three days ago with dizziness.  Woozy feeling with walking.  With sitting, better. When gets up, dizzy.  Restarted Meclizine; has suffered with dizziness in the past.  Last time, [place] prescribed Amoxicillin; dx with otitis media.  No fever/chills/sweats. Intermittent headache; mild headaches in past week.  Hearing loss chronic; no change.  No new tinnitus.  No rhinorrhea; +PND clear deep white light gray. No PND.  No cough.  No sore throat; intermittent ear pain.  New glaucoma drops; no vision changes.  Had eye exam yesterday.  No nausea.  No dizziness with turning Hutchinson a certain direction or with rolling over in bed.  A little better today; took baby ASA today.  A little unsteady with walking; no falls.  No syncope.     BP Readings from Last 3 Encounters:  01/10/17 (!) 158/80  11/17/16 138/82  12/18/15 (!) 178/84   Wt Readings from Last 3 Encounters:  01/10/17 176 lb (79.8 kg)  11/17/16 180 lb (81.6 kg)  12/18/15 182 lb 9.6 oz (82.8 kg)   Immunization History  Administered Date(s) Administered  . Influenza-Unspecified 02/26/2015  . Tdap 05/27/2012    Review of Systems  Constitutional: Negative for activity change, appetite change, chills, diaphoresis, fatigue and fever.  HENT: Positive for hearing loss, postnasal drip and tinnitus. Negative for congestion, ear pain, rhinorrhea, sinus pain and sinus pressure.   Respiratory: Negative for cough and shortness of breath.   Cardiovascular: Negative for chest pain, palpitations and leg swelling.  Gastrointestinal: Negative for abdominal pain, diarrhea, nausea and vomiting.  Endocrine: Negative for cold intolerance, heat intolerance, polydipsia, polyphagia and polyuria.  Skin: Negative for color change, rash and wound.    Neurological: Positive for dizziness and headaches. Negative for tremors, seizures, syncope, facial asymmetry, speech difficulty, weakness, light-headedness and numbness.  Psychiatric/Behavioral: Negative for dysphoric mood and sleep disturbance. The patient is not nervous/anxious.     Past Medical History:  Diagnosis Date  . Cataract   . Glaucoma   . Prostate cancer [facility])    Past Surgical History:  Procedure Laterality Date  . PROSTATE SURGERY  2007   No Known Allergies  Social History   Social History  . Marital status: Divorced    Spouse name: N/A  . Number of children: N/A  . Years of education: N/A   Occupational History  . Retired    Social History Main Topics  . Smoking status: Never Smoker  . Smokeless tobacco: Never Used  . Alcohol use 3.0 - 4.2 oz/week    5 - 7 Standard drinks or equivalent per week  . Drug use: No  . Sexual activity: Not on file   Other Topics Concern  . Not on file   Social History Narrative   Divorced.    Lives alone.   Drinks 2-3 miller lights per day.     Quit smoking 1978.       Family History  Problem Relation Age of Onset  . Diabetes Mother   . Heart disease Brother        Objective:    BP (!) 158/80   Pulse 72   Temp 97.8 F (36.6 C) (Oral)   Resp 16   Ht 5' 7.32" (1.71 m)  Wt 176 lb (79.8 kg)   SpO2 95%   BMI 27.30 kg/m  Physical Exam  Constitutional: He is oriented to person, place, and time. He appears well-developed and well-nourished. No distress.  HENT:  Hutchinson: Normocephalic and atraumatic.  Right Ear: External ear normal.  Left Ear: External ear normal.  Nose: Nose normal.  Mouth/Throat: Oropharynx is clear and moist.  Eyes: Pupils are equal, round, and reactive to light. Conjunctivae and EOM are normal.  Neck: Normal range of motion. Neck supple. Carotid bruit is not present. No thyromegaly present.  Cardiovascular: Normal rate, regular rhythm, normal heart sounds and intact distal pulses.  Exam  reveals no gallop and no friction rub.   No murmur heard. Pulmonary/Chest: Effort normal and breath sounds normal. He has no wheezes. He has no rales.  Abdominal: Soft. Bowel sounds are normal. He exhibits no distension and no mass. There is no tenderness. There is no rebound and no guarding.  Lymphadenopathy:    He has no cervical adenopathy.  Neurological: He is alert and oriented to person, place, and time. No cranial nerve deficit. He exhibits normal muscle tone. He displays a negative Romberg sign. Coordination and gait normal.  Dix-Hallpike WNL.  Skin: Skin is warm and dry. No rash noted. He is not diaphoretic.  Psychiatric: He has a normal mood and affect. His behavior is normal. Judgment and thought content normal.  Nursing note and vitals reviewed.   No results found. Depression screen [facility] 2/9 01/10/2017 11/17/2016 12/18/2015 12/08/2015 05/26/2015  Decreased Interest 0 0 0 0 0  Down, Depressed, Hopeless 0 0 0 0 0  PHQ - 2 Score 0 0 0 0 0   Fall Risk  01/10/2017 11/17/2016 12/18/2015 12/08/2015 05/26/2015  Falls in the past year? No No No No No    EKG: NSR; non-specific T wave changes.  No acute process. One PVC.     Assessment & Plan:   1. Dizziness   2. Pure hypercholesterolemia   3. Other headache syndrome    -new onset dizziness/unsteadiness not completely consistent with BPV. -due to age and comorbidities, concern for underlying intracranial pathology.  Obtain labs with EKG and obtain MRI/MRA brain. -continue meclizine for dizziness. -normal neurological exam in office. -to ED for acute worsening; close follow-up in 1-2 weeks.  -chronic white coat hypertension per patient; normal home BP readings.  Orders Placed This Encounter  Procedures  . MR MRA Hutchinson WO CONTRAST    Standing Status:   Future    Standing Expiration Date:   03/12/2018    Order Specific Question:   What is the patient's sedation requirement?    Answer:   No Sedation    Order Specific Question:   Does  the patient have a pacemaker or implanted devices?    Answer:   No    Order Specific Question:   Preferred imaging location?    Answer:   [facility] (table limit-550lbs)    Order Specific Question:   Radiology Contrast Protocol - do NOT remove file path    Answer:   \\[email or web address]  . CBC with Differential/Platelet  . Comprehensive metabolic panel  . POCT urinalysis dipstick  . EKG 12-Lead   No orders of the defined types were placed in this encounter.   Return in about 1 week (around 01/17/2017) for recheck dizziness.   Betty Brooks Elayne Guerin, M.D. Primary Care at [facility] [place] [address] [place], [place]  [address] [phone]  phone [phone] fax

## 2017-01-10 NOTE — Patient Instructions (Addendum)
Take ASPIRIN 81MG  one tablet daily. Continue Meclizine 25mg  as needed for dizziness.   IF you received an x-ray today, you will receive an invoice from [facility]. Please contact [facility] at [phone] with questions or concerns regarding your invoice.   IF you received labwork today, you will receive an invoice from [facility]. Please contact [facility] at [phone] with questions or concerns regarding your invoice.   Our billing staff will not be able to assist you with questions regarding bills from these companies.  You will be contacted with the lab results as soon as they are available. The fastest way to get your results is to activate your My Chart account. Instructions are located on the last page of this paperwork. If you have not heard from [place] regarding the results in 2 weeks, please contact this office.      Dizziness Dizziness is a common problem. It is a feeling of unsteadiness or light-headedness. You may feel like you are about to faint. Dizziness can lead to injury if you stumble or fall. Anyone can become dizzy, but dizziness is more common in older adults. This condition can be caused by a number of things, including medicines, dehydration, or illness. Follow these instructions at home: Taking these steps may help with your condition: Eating and drinking  Drink enough fluid to keep your urine clear or pale yellow. This helps to keep you from becoming dehydrated. Try to drink more clear fluids, such as water.  Do not drink alcohol.  Limit your caffeine intake if directed by your health care provider.  Limit your salt intake if directed by your health care provider. Activity  Avoid making quick movements. ? Rise slowly from chairs and steady yourself until you feel okay. ? In the morning, first sit up on the side of the bed. When you feel okay, stand slowly while you hold onto something until you know that your balance is fine.  Move your  legs often if you need to stand in one place for a long time. Tighten and relax your muscles in your legs while you are standing.  Do not drive or operate heavy machinery if you feel dizzy.  Avoid bending down if you feel dizzy. Place items in your home so that they are easy for you to reach without leaning over. Lifestyle  Do not use any tobacco products, including cigarettes, chewing tobacco, or electronic cigarettes. If you need help quitting, ask your health care provider.  Try to reduce your stress level, such as with yoga or meditation. Talk with your health care provider if you need help. General instructions  Watch your dizziness for any changes.  Take medicines only as directed by your health care provider. Talk with your health care provider if you think that your dizziness is caused by a medicine that you are taking.  Tell a friend or a family member that you are feeling dizzy. If he or she notices any changes in your behavior, have this person call your health care provider.  Keep all follow-up visits as directed by your health care provider. This is important. Contact a health care provider if:  Your dizziness does not go away.  Your dizziness or light-headedness gets worse.  You feel nauseous.  You have reduced hearing.  You have new symptoms.  You are unsteady on your feet or you feel like the room is spinning. Get help right away if:  You vomit or have diarrhea and are unable to eat or  drink anything.  You have problems talking, walking, swallowing, or using your arms, hands, or legs.  You feel generally weak.  You are not thinking clearly or you have trouble forming sentences. It may take a friend or family member to notice this.  You have chest pain, abdominal pain, shortness of breath, or sweating.  Your vision changes.  You notice any bleeding.  You have a headache.  You have neck pain or a stiff neck.  You have a fever. This information is not  intended to replace advice given to you by your health care provider. Make sure you discuss any questions you have with your health care provider. Document Released: 11/08/2000 Document Revised: 10/21/2015 Document Reviewed: 05/11/2014 Elsevier Interactive Patient Education  2017 [employer].

## 2017-01-11 LAB — CBC WITH DIFFERENTIAL/PLATELET
BASOS: 0 %
Basophils Absolute: 0 10*3/uL (ref 0.0–0.2)
EOS (ABSOLUTE): 0.1 10*3/uL (ref 0.0–0.4)
Eos: 1 %
Hematocrit: 45.9 % (ref 37.5–51.0)
Hemoglobin: 15.3 g/dL (ref 13.0–17.7)
Immature Grans (Abs): 0 10*3/uL (ref 0.0–0.1)
Immature Granulocytes: 0 %
LYMPHS: 34 %
Lymphocytes Absolute: 2.6 10*3/uL (ref 0.7–3.1)
MCH: 32.5 pg (ref 26.6–33.0)
MCHC: 33.3 g/dL (ref 31.5–35.7)
MCV: 98 fL — ABNORMAL HIGH (ref 79–97)
Monocytes Absolute: 0.8 10*3/uL (ref 0.1–0.9)
Monocytes: 11 %
NEUTROS ABS: 4.2 10*3/uL (ref 1.4–7.0)
Neutrophils: 54 %
PLATELETS: 214 10*3/uL (ref 150–379)
RBC: 4.71 x10E6/uL (ref 4.14–5.80)
RDW: 13.8 % (ref 12.3–15.4)
WBC: 7.8 10*3/uL (ref 3.4–10.8)

## 2017-01-11 LAB — COMPREHENSIVE METABOLIC PANEL
A/G RATIO: 1.7 (ref 1.2–2.2)
ALK PHOS: 89 IU/L (ref 39–117)
ALT: 18 IU/L (ref 0–44)
AST: 22 IU/L (ref 0–40)
Albumin: 4.5 g/dL (ref 3.5–4.8)
BUN / CREAT RATIO: 11 (ref 10–24)
BUN: 10 mg/dL (ref 8–27)
Bilirubin Total: 0.9 mg/dL (ref 0.0–1.2)
CALCIUM: 9.3 mg/dL (ref 8.6–10.2)
CHLORIDE: 96 mmol/L (ref 96–106)
CO2: 20 mmol/L (ref 20–29)
Creatinine, Ser: 0.88 mg/dL (ref 0.76–1.27)
GFR calc non Af Amer: 82 mL/min/{1.73_m2} (ref >59)
GFR, EST AFRICAN AMERICAN: 94 mL/min/{1.73_m2} (ref >59)
Globulin, Total: 2.7 g/dL (ref 1.5–4.5)
Glucose: 91 mg/dL (ref 65–99)
Potassium: 4.1 mmol/L (ref 3.5–5.2)
SODIUM: 132 mmol/L — AB (ref 134–144)
Total Protein: 7.2 g/dL (ref 6.0–8.5)

## 2017-01-12 ENCOUNTER — Ambulatory Visit (HOSPITAL_COMMUNITY)
Admission: RE | Admit: 2017-01-12 | Discharge: 2017-01-12 | Disposition: A | Discharge status: Home / Self Care | Payer: Medicare Other | Source: Ambulatory Visit | Attending: Cardiovascular Disease | Admitting: Cardiovascular Disease

## 2017-01-12 DIAGNOSIS — R42 Dizziness and giddiness: Secondary | ICD-10-CM | POA: Diagnosis not present

## 2017-01-12 DIAGNOSIS — I6523 Occlusion and stenosis of bilateral carotid arteries: Secondary | ICD-10-CM | POA: Diagnosis not present

## 2017-01-12 DIAGNOSIS — E7800 Pure hypercholesterolemia, unspecified: Secondary | ICD-10-CM

## 2017-01-16 ENCOUNTER — Telehealth: Payer: Self-pay

## 2017-01-16 NOTE — Telephone Encounter (Signed)
Called pt to schedule Medicare Annual Wellness Visit. -nr  

## 2017-01-19 ENCOUNTER — Telehealth: Payer: Self-pay | Admitting: Physician Assistant

## 2017-01-19 ENCOUNTER — Encounter: Payer: Self-pay | Admitting: Family Medicine

## 2017-01-19 ENCOUNTER — Ambulatory Visit (INDEPENDENT_AMBULATORY_CARE_PROVIDER_SITE_OTHER): Payer: Medicare Other | Admitting: Family Medicine

## 2017-01-19 VITALS — BP 168/72 | HR 66 | Temp 97.6°F | Resp 16 | Ht 67.0 in | Wt 179.4 lb

## 2017-01-19 DIAGNOSIS — R030 Elevated blood-pressure reading, without diagnosis of hypertension: Secondary | ICD-10-CM

## 2017-01-19 DIAGNOSIS — R42 Dizziness and giddiness: Secondary | ICD-10-CM | POA: Diagnosis not present

## 2017-01-19 NOTE — Progress Notes (Signed)
Subjective:    Patient ID: Guy Hutchinson, male    DOB: 04-08-1936, 81 y.o.   MRN: [id number]  01/19/2017  Dizziness (says he is not worse and not better) and Follow-up   HPI This 81 y.o. male presents for one week follow-up of dizziness.  Labs obtained and WNL; referred for carotid dopplers and negative.   EKG obtained without acute abnormalities yet nonspecific T wave changes.  Doing better from last week.  Now only dizzy upon standing up and when standing and having eyes closed.  No dizziness with laying supine.  25% improved from last week.  Has not been notified regarding MRI. Wednesday or Thursday are best days.  No longer getting dizziness when getting up.  No dizziness when turning head side to side.  No dizziness when rolling over.   Mild intermittent pain.  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   No blurred vision; no diplopia.   No nausea or vomiting. No sudden hearing loss; no ringing in ears.   Ear was popping the past couple of mornings. No rhinorrhea; no nasal congestion.  No falls.      BP Readings from Last 3 Encounters:  01/19/17 (!) 168/72  01/10/17 (!) 158/80  11/17/16 138/82   Wt Readings from Last 3 Encounters:  01/19/17 179 lb 6.4 oz (81.4 kg)  01/10/17 176 lb (79.8 kg)  11/17/16 180 lb (81.6 kg)   Immunization History  Administered Date(s) Administered  . Influenza-Unspecified 02/26/2015  . Tdap 05/27/2012    Review of Systems  Constitutional: Negative for activity change, appetite change, chills, diaphoresis, fatigue and fever.  HENT: Negative for congestion, drooling, ear discharge, ear pain, facial swelling, hearing loss, postnasal drip, rhinorrhea, sinus pain, sinus pressure, sneezing, sore throat, trouble swallowing and voice change.   Eyes: Negative for photophobia, redness and visual disturbance.  Respiratory: Negative for cough and shortness of breath.   Cardiovascular: Negative for chest pain, palpitations and leg swelling.    Gastrointestinal: Negative for abdominal pain, diarrhea, nausea and vomiting.  Endocrine: Negative for cold intolerance, heat intolerance, polydipsia, polyphagia and polyuria.  Skin: Negative for color change, rash and wound.  Neurological: Positive for dizziness and headaches. Negative for tremors, seizures, syncope, facial asymmetry, speech difficulty, weakness, light-headedness and numbness.  Psychiatric/Behavioral: Negative for dysphoric mood and sleep disturbance. The patient is not nervous/anxious.     Past Medical History:  Diagnosis Date  . Cataract   . Glaucoma   . Prostate cancer [facility])    Past Surgical History:  Procedure Laterality Date  . PROSTATE SURGERY  2007   No Known Allergies Current Outpatient Prescriptions  Medication Sig Dispense Refill  . ALFALFA PO Take 1 capsule by mouth every morning.    . brimonidine-timolol (COMBIGAN) 0.2-0.5 % ophthalmic solution Place 1 drop into both eyes every 12 (twelve) hours.    Marland Kitchen DANDELION PO Take 1 capsule by mouth every morning.    Marland Kitchen GARLIC PO Take 1 capsule by mouth 3 (three) times daily before meals.    Marland Kitchen MILK THISTLE PO Take 1 capsule by mouth every morning.    . Multiple Vitamin (MULTIVITAMIN WITH MINERALS) TABS Take 1 tablet by mouth daily.    Marland Kitchen OVER THE COUNTER MEDICATION Take 1 capsule by mouth every morning. OTC supplement Pyginal (antioxidant)    . OVER THE COUNTER MEDICATION Take 1 capsule by mouth every morning. OTC acai supplement    . OVER THE COUNTER MEDICATION Take 1 capsule by mouth every morning. OTC  tumeric supplement     No current facility-administered medications for this visit.    Social History   Social History  . Marital status: Divorced    Spouse name: N/A  . Number of children: N/A  . Years of education: N/A   Occupational History  . Retired    Social History Main Topics  . Smoking status: Never Smoker  . Smokeless tobacco: Never Used  . Alcohol use 3.0 - 4.2 oz/week    5 - 7 Standard  drinks or equivalent per week  . Drug use: No  . Sexual activity: Not on file   Other Topics Concern  . Not on file   Social History Narrative   Divorced.    Lives alone.   Drinks 2-3 miller lights per day.     Quit smoking 1978.       Family History  Problem Relation Age of Onset  . Diabetes Mother   . Heart disease Brother        Objective:    BP (!) 168/72   Pulse 66   Temp 97.6 F (36.4 C) (Oral)   Resp 16   Ht 5\' 7"  (1.702 m)   Wt 179 lb 6.4 oz (81.4 kg)   SpO2 97%   BMI 28.10 kg/m  Physical Exam  Constitutional: He is oriented to person, place, and time. He appears well-developed and well-nourished. No distress.  HENT:  Head: Normocephalic and atraumatic.  Right Ear: External ear normal.  Left Ear: External ear normal.  Nose: Nose normal.  Mouth/Throat: Oropharynx is clear and moist.  Eyes: Pupils are equal, round, and reactive to light. Conjunctivae and EOM are normal.  Neck: Normal range of motion. Neck supple. Carotid bruit is not present. No thyromegaly present.  Cardiovascular: Normal rate, regular rhythm, normal heart sounds and intact distal pulses.  Exam reveals no gallop and no friction rub.   No murmur heard. Pulmonary/Chest: Effort normal and breath sounds normal. He has no wheezes. He has no rales.  Abdominal: Soft. Bowel sounds are normal. He exhibits no distension and no mass. There is no tenderness. There is no rebound and no guarding.  Lymphadenopathy:    He has no cervical adenopathy.  Neurological: He is alert and oriented to person, place, and time. He has normal strength. No cranial nerve deficit or sensory deficit. He exhibits normal muscle tone. He displays a negative Romberg sign. Coordination and gait normal.  Skin: Skin is warm and dry. No rash noted. He is not diaphoretic.  Psychiatric: He has a normal mood and affect. His behavior is normal. Judgment and thought content normal.  Nursing note and vitals reviewed.  Results for  orders placed or performed in visit on 01/10/17  CBC with Differential/Platelet  Result Value Ref Range   WBC 7.8 3.4 - 10.8 x10E3/uL   RBC 4.71 4.14 - 5.80 x10E6/uL   Hemoglobin 15.3 13.0 - 17.7 g/dL   Hematocrit 45.9 37.5 - 51.0 %   MCV 98 (H) 79 - 97 fL   MCH 32.5 26.6 - 33.0 pg   MCHC 33.3 31.5 - 35.7 g/dL   RDW 13.8 12.3 - 15.4 %   Platelets 214 150 - 379 x10E3/uL   Neutrophils 54 Not Estab. %   Lymphs 34 Not Estab. %   Monocytes 11 Not Estab. %   Eos 1 Not Estab. %   Basos 0 Not Estab. %   Neutrophils Absolute 4.2 1.4 - 7.0 x10E3/uL   Lymphocytes Absolute 2.6 0.7 -  3.1 x10E3/uL   Monocytes Absolute 0.8 0.1 - 0.9 x10E3/uL   EOS (ABSOLUTE) 0.1 0.0 - 0.4 x10E3/uL   Basophils Absolute 0.0 0.0 - 0.2 x10E3/uL   Immature Granulocytes 0 Not Estab. %   Immature Grans (Abs) 0.0 0.0 - 0.1 x10E3/uL  Comprehensive metabolic panel  Result Value Ref Range   Glucose 91 65 - 99 mg/dL   BUN 10 8 - 27 mg/dL   Creatinine, Ser 0.88 0.76 - 1.27 mg/dL   GFR calc non Af Amer 82 >59 mL/min/1.73   GFR calc Af Amer 94 >59 mL/min/1.73   BUN/Creatinine Ratio 11 10 - 24   Sodium 132 (L) 134 - 144 mmol/L   Potassium 4.1 3.5 - 5.2 mmol/L   Chloride 96 96 - 106 mmol/L   CO2 20 20 - 29 mmol/L   Calcium 9.3 8.6 - 10.2 mg/dL   Total Protein 7.2 6.0 - 8.5 g/dL   Albumin 4.5 3.5 - 4.8 g/dL   Globulin, Total 2.7 1.5 - 4.5 g/dL   Albumin/Globulin Ratio 1.7 1.2 - 2.2   Bilirubin Total 0.9 0.0 - 1.2 mg/dL   Alkaline Phosphatase 89 39 - 117 IU/L   AST 22 0 - 40 IU/L   ALT 18 0 - 44 IU/L  POCT urinalysis dipstick  Result Value Ref Range   Color, UA yellow yellow   Clarity, UA clear clear   Glucose, UA negative negative mg/dL   Bilirubin, UA negative negative   Ketones, POC UA negative negative mg/dL   Spec Grav, UA 1.010 1.010 - 1.025   Blood, UA negative negative   pH, UA 7.0 5.0 - 8.0   Protein Ur, POC negative negative mg/dL   Urobilinogen, UA 0.2 0.2 or 1.0 E.U./dL   Nitrite, UA Negative  Negative   Leukocytes, UA Negative Negative   No results found. Depression screen [facility] 2/9 01/19/2017 01/10/2017 11/17/2016 12/18/2015 12/08/2015  Decreased Interest 0 0 0 0 0  Down, Depressed, Hopeless 0 0 0 0 0  PHQ - 2 Score 0 0 0 0 0   Fall Risk  01/19/2017 01/10/2017 11/17/2016 12/18/2015 12/08/2015  Falls in the past year? No No No No No        Assessment & Plan:   1. Dizziness   2. White coat syndrome without diagnosis of hypertension    -dizziness improved 25% in two weeks; s/p carotid dopplers that showed minimal stenosis; check status of MRI brain to rule out posterior circulation pathology. -continue with Meclizine. -if no improvement in 2-3 weeks, refer to neurology.   No orders of the defined types were placed in this encounter.  No orders of the defined types were placed in this encounter.   No Follow-up on file.   Haya Hemler Elayne Guerin, M.D. Primary Care at [facility] [address] [place], [place]  [address] [phone] phone [phone] fax

## 2017-01-19 NOTE — Patient Instructions (Addendum)
IF you received an x-ray today, you will receive an invoice from [facility]. Please contact [facility] at [phone] with questions or concerns regarding your invoice.   IF you received labwork today, you will receive an invoice from [facility]. Please contact [facility] at [phone] with questions or concerns regarding your invoice.   Our billing staff will not be able to assist you with questions regarding bills from these companies.  You will be contacted with the lab results as soon as they are available. The fastest way to get your results is to activate your My Chart account. Instructions are located on the last page of this paperwork. If you have not heard from [place] regarding the results in 2 weeks, please contact this office.     How to Perform the Epley Maneuver The Epley maneuver is an exercise that relieves symptoms of vertigo. Vertigo is the feeling that you or your surroundings are moving when they are not. When you feel vertigo, you may feel like the room is spinning and have trouble walking. Dizziness is a little different than vertigo. When you are dizzy, you may feel unsteady or light-headed. You can do this maneuver at home whenever you have symptoms of vertigo. You can do it up to 3 times a day until your symptoms go away. Even though the Epley maneuver may relieve your vertigo for a few weeks, it is possible that your symptoms will return. This maneuver relieves vertigo, but it does not relieve dizziness. What are the risks? If it is done correctly, the Epley maneuver is considered safe. Sometimes it can lead to dizziness or nausea that goes away after a short time. If you develop other symptoms, such as changes in vision, weakness, or numbness, stop doing the maneuver and call your health care provider. How to perform the Epley maneuver 1. Sit on the edge of a bed or table with your back straight and your legs extended or hanging over the edge of the  bed or table. 2. Turn your head halfway toward the affected ear or side. 3. Lie backward quickly with your head turned until you are lying flat on your back. You may want to position a pillow under your shoulders. 4. Hold this position for 30 seconds. You may experience an attack of vertigo. This is normal. 5. Turn your head to the opposite direction until your unaffected ear is facing the floor. 6. Hold this position for 30 seconds. You may experience an attack of vertigo. This is normal. Hold this position until the vertigo stops. 7. Turn your whole body to the same side as your head. Hold for another 30 seconds. 8. Sit back up. You can repeat this exercise up to 3 times a day. Follow these instructions at home:  After doing the Epley maneuver, you can return to your normal activities.  Ask your health care provider if there is anything you should do at home to prevent vertigo. He or she may recommend that you: ? Keep your head raised (elevated) with two or more pillows while you sleep. ? Do not sleep on the side of your affected ear. ? Get up slowly from bed. ? Avoid sudden movements during the day. ? Avoid extreme head movement, like looking up or bending over. Contact a health care provider if:  Your vertigo gets worse.  You have other symptoms, including: ? Nausea. ? Vomiting. ? Headache. Get help right away if:  You have vision changes.  You  have a severe or worsening headache or neck pain.  You cannot stop vomiting.  You have new numbness or weakness in any part of your body. Summary  Vertigo is the feeling that you or your surroundings are moving when they are not.  The Epley maneuver is an exercise that relieves symptoms of vertigo.  If the Epley maneuver is done correctly, it is considered safe. You can do it up to 3 times a day. This information is not intended to replace advice given to you by your health care provider. Make sure you discuss any questions you  have with your health care provider. Document Released: 05/20/2013 Document Revised: 04/04/2016 Document Reviewed: 04/04/2016 Elsevier Interactive Patient Education  2017 [employer].

## 2017-01-19 NOTE — Telephone Encounter (Signed)
Patient came in claiming Dr. Domingo Mend had been on the phone with him when it cut out, he wanted to make sure he got the message.  Best Number [phone]

## 2017-01-22 ENCOUNTER — Telehealth: Payer: Self-pay | Admitting: Emergency Medicine

## 2017-01-22 NOTE — Telephone Encounter (Signed)
I am not familiar with this patient.  He saw Dr. Tamala Julian on 8/24, the day of this message. I did not call him. What can we help him with?

## 2017-01-22 NOTE — Telephone Encounter (Signed)
Left a message to confirm today's MRI appointment.

## 2017-01-24 ENCOUNTER — Ambulatory Visit (HOSPITAL_COMMUNITY): Payer: Medicare Other

## 2017-01-26 ENCOUNTER — Ambulatory Visit (HOSPITAL_COMMUNITY): Payer: Medicare Other

## 2017-01-26 ENCOUNTER — Telehealth: Payer: Self-pay

## 2017-01-26 NOTE — Telephone Encounter (Signed)
Called pt to schedule Medicare Annual Wellness Visit. -nr  

## 2017-01-31 ENCOUNTER — Ambulatory Visit (HOSPITAL_COMMUNITY)
Admission: RE | Admit: 2017-01-31 | Discharge: 2017-01-31 | Disposition: A | Discharge status: Home / Self Care | Payer: Medicare Other | Source: Ambulatory Visit | Attending: Family Medicine | Admitting: Family Medicine

## 2017-01-31 ENCOUNTER — Telehealth: Payer: Self-pay

## 2017-01-31 DIAGNOSIS — R42 Dizziness and giddiness: Secondary | ICD-10-CM | POA: Diagnosis not present

## 2017-01-31 DIAGNOSIS — E7800 Pure hypercholesterolemia, unspecified: Secondary | ICD-10-CM | POA: Insufficient documentation

## 2017-01-31 NOTE — Telephone Encounter (Signed)
Pt called back regarding annual wellness exam. Pt stated that he would have to call back to schedule because now was not a good time.

## 2017-01-31 NOTE — Telephone Encounter (Signed)
Left detailed message to call back with any concerns. No notes in chart.

## 2017-02-07 ENCOUNTER — Ambulatory Visit (INDEPENDENT_AMBULATORY_CARE_PROVIDER_SITE_OTHER): Payer: Medicare Other | Admitting: Family Medicine

## 2017-02-07 ENCOUNTER — Encounter: Payer: Self-pay | Admitting: Family Medicine

## 2017-02-07 VITALS — BP 126/68 | HR 82 | Temp 98.0°F | Resp 13 | Ht 67.72 in | Wt 178.0 lb

## 2017-02-07 DIAGNOSIS — R42 Dizziness and giddiness: Secondary | ICD-10-CM | POA: Diagnosis not present

## 2017-02-07 NOTE — Progress Notes (Signed)
Subjective:    Patient ID: Guy Hutchinson, male    DOB: [birth year], 81 y.o.   MRN: [id number]  02/07/2017  Dizziness (follow-up on MRI results )   HPI This 81 y.o. male presents for follow-up evaluation of dizziness.  Dizziness much better; less frequent; not daily.  No dizziness yesterday; played golf with no problems yesterday.  Today has suffered with mild dizziness today.  35% improved.  No problems upon awakening.   No further headaches.  No new hearing loss or tinnitus.  No nasal congestion or rhinorrhea.  No diplopia or blurred vision.  No falls; no syncope.  No chest pain or palpitations; no SOB.  S/p MRI last week; presenting for results.  S/p carotid dopplers with minimal stenosis.  Performed Eply maneuvers a few times in past week.  Decided to decrease alcohol intake to two drinks per day; was drinking four drinks per day.  MRI/MRA head without contrast shows the following: FINDINGS: Both internal carotid arteries are widely patent into the brain. No siphon stenosis. The anterior and middle cerebral vessels are patent without proximal stenosis, aneurysm or vascular malformation.  Both vertebral arteries are widely patent to the basilar. No basilar stenosis. Incidental basilar fenestration, not significant. Posterior circulation branch vessels appear normal.  IMPRESSION: Normal examination. No vascular pathology identified to explain dizziness.  BP Readings from Last 3 Encounters:  02/07/17 126/68  01/19/17 (!) 168/72  01/10/17 (!) 158/80   Wt Readings from Last 3 Encounters:  02/07/17 178 lb (80.7 kg)  01/19/17 179 lb 6.4 oz (81.4 kg)  01/10/17 176 lb (79.8 kg)   Immunization History  Administered Date(s) Administered  . Influenza-Unspecified 02/26/2015  . Tdap 05/27/2012    Review of Systems  Constitutional: Negative for activity change, appetite change, chills, diaphoresis, fatigue and fever.  HENT: Negative for congestion, dental problem, drooling, ear  discharge, ear pain, facial swelling, hearing loss, mouth sores, nosebleeds, postnasal drip, rhinorrhea, sinus pain, sinus pressure, sneezing, sore throat, tinnitus, trouble swallowing and voice change.   Respiratory: Negative for cough and shortness of breath.   Cardiovascular: Negative for chest pain, palpitations and leg swelling.  Gastrointestinal: Negative for abdominal pain, diarrhea, nausea and vomiting.  Endocrine: Negative for cold intolerance, heat intolerance, polydipsia, polyphagia and polyuria.  Skin: Negative for color change, rash and wound.  Neurological: Positive for dizziness. Negative for tremors, seizures, syncope, facial asymmetry, speech difficulty, weakness, light-headedness, numbness and headaches.  Psychiatric/Behavioral: Negative for dysphoric mood and sleep disturbance. The patient is not nervous/anxious.     Past Medical History:  Diagnosis Date  . Cataract   . Glaucoma   . Prostate cancer [facility])    Past Surgical History:  Procedure Laterality Date  . PROSTATE SURGERY  2007   No Known Allergies Current Outpatient Prescriptions  Medication Sig Dispense Refill  . ALFALFA PO Take 1 capsule by mouth every morning.    . brimonidine-timolol (COMBIGAN) 0.2-0.5 % ophthalmic solution Place 1 drop into both eyes every 12 (twelve) hours.    Marland Kitchen DANDELION PO Take 1 capsule by mouth every morning.    Marland Kitchen GARLIC PO Take 1 capsule by mouth 3 (three) times daily before meals.    Marland Kitchen MILK THISTLE PO Take 1 capsule by mouth every morning.    . Multiple Vitamin (MULTIVITAMIN WITH MINERALS) TABS Take 1 tablet by mouth daily.    Marland Kitchen OVER THE COUNTER MEDICATION Take 1 capsule by mouth every morning. OTC supplement Pyginal (antioxidant)    . OVER THE COUNTER  MEDICATION Take 1 capsule by mouth every morning. OTC acai supplement    . OVER THE COUNTER MEDICATION Take 1 capsule by mouth every morning. OTC tumeric supplement     No current facility-administered medications for this visit.     Social History   Social History  . Marital status: Divorced    Spouse name: N/A  . Number of children: N/A  . Years of education: N/A   Occupational History  . Retired    Social History Main Topics  . Smoking status: Never Smoker  . Smokeless tobacco: Never Used  . Alcohol use 3.0 - 4.2 oz/week    5 - 7 Standard drinks or equivalent per week  . Drug use: No  . Sexual activity: Not on file   Other Topics Concern  . Not on file   Social History Narrative   Divorced.    Lives alone.   Drinks 2-3 miller lights per day.     Quit smoking 1978.       Family History  Problem Relation Age of Onset  . Diabetes Mother   . Heart disease Brother        Objective:    BP 126/68   Pulse 82   Temp 98 F (36.7 C) (Oral)   Resp 13   Ht 5' 7.72" (1.72 m)   Wt 178 lb (80.7 kg)   SpO2 97%   BMI 27.29 kg/m  Physical Exam  Constitutional: He is oriented to person, place, and time. He appears well-developed and well-nourished. No distress.  HENT:  Head: Normocephalic and atraumatic.  Right Ear: External ear normal.  Left Ear: External ear normal.  Nose: Nose normal.  Mouth/Throat: Oropharynx is clear and moist.  Eyes: Pupils are equal, round, and reactive to light. Conjunctivae and EOM are normal.  Neck: Normal range of motion. Neck supple. Carotid bruit is not present. No thyromegaly present.  Cardiovascular: Normal rate, regular rhythm, normal heart sounds and intact distal pulses.  Exam reveals no gallop and no friction rub.   No murmur heard. Pulmonary/Chest: Effort normal and breath sounds normal. He has no wheezes. He has no rales.  Abdominal: Soft. Bowel sounds are normal. He exhibits no distension and no mass. There is no tenderness. There is no rebound and no guarding.  Lymphadenopathy:    He has no cervical adenopathy.  Neurological: He is alert and oriented to person, place, and time. He displays no tremor. No cranial nerve deficit or sensory deficit. He exhibits  normal muscle tone. He displays a negative Romberg sign. Coordination normal.  Skin: Skin is warm and dry. No rash noted. He is not diaphoretic.  Psychiatric: He has a normal mood and affect. His behavior is normal.  Nursing note and vitals reviewed.  Results for orders placed or performed in visit on 01/10/17  CBC with Differential/Platelet  Result Value Ref Range   WBC 7.8 3.4 - 10.8 x10E3/uL   RBC 4.71 4.14 - 5.80 x10E6/uL   Hemoglobin 15.3 13.0 - 17.7 g/dL   Hematocrit 45.9 37.5 - 51.0 %   MCV 98 (H) 79 - 97 fL   MCH 32.5 26.6 - 33.0 pg   MCHC 33.3 31.5 - 35.7 g/dL   RDW 13.8 12.3 - 15.4 %   Platelets 214 150 - 379 x10E3/uL   Neutrophils 54 Not Estab. %   Lymphs 34 Not Estab. %   Monocytes 11 Not Estab. %   Eos 1 Not Estab. %   Basos 0 Not Estab. %  Neutrophils Absolute 4.2 1.4 - 7.0 x10E3/uL   Lymphocytes Absolute 2.6 0.7 - 3.1 x10E3/uL   Monocytes Absolute 0.8 0.1 - 0.9 x10E3/uL   EOS (ABSOLUTE) 0.1 0.0 - 0.4 x10E3/uL   Basophils Absolute 0.0 0.0 - 0.2 x10E3/uL   Immature Granulocytes 0 Not Estab. %   Immature Grans (Abs) 0.0 0.0 - 0.1 x10E3/uL  Comprehensive metabolic panel  Result Value Ref Range   Glucose 91 65 - 99 mg/dL   BUN 10 8 - 27 mg/dL   Creatinine, Ser 0.88 0.76 - 1.27 mg/dL   GFR calc non Af Amer 82 >59 mL/min/1.73   GFR calc Af Amer 94 >59 mL/min/1.73   BUN/Creatinine Ratio 11 10 - 24   Sodium 132 (L) 134 - 144 mmol/L   Potassium 4.1 3.5 - 5.2 mmol/L   Chloride 96 96 - 106 mmol/L   CO2 20 20 - 29 mmol/L   Calcium 9.3 8.6 - 10.2 mg/dL   Total Protein 7.2 6.0 - 8.5 g/dL   Albumin 4.5 3.5 - 4.8 g/dL   Globulin, Total 2.7 1.5 - 4.5 g/dL   Albumin/Globulin Ratio 1.7 1.2 - 2.2   Bilirubin Total 0.9 0.0 - 1.2 mg/dL   Alkaline Phosphatase 89 39 - 117 IU/L   AST 22 0 - 40 IU/L   ALT 18 0 - 44 IU/L  POCT urinalysis dipstick  Result Value Ref Range   Color, UA yellow yellow   Clarity, UA clear clear   Glucose, UA negative negative mg/dL   Bilirubin, UA  negative negative   Ketones, POC UA negative negative mg/dL   Spec Grav, UA 1.010 1.010 - 1.025   Blood, UA negative negative   pH, UA 7.0 5.0 - 8.0   Protein Ur, POC negative negative mg/dL   Urobilinogen, UA 0.2 0.2 or 1.0 E.U./dL   Nitrite, UA Negative Negative   Leukocytes, UA Negative Negative   No results found.    Depression screen [facility] 2/9 02/07/2017 01/19/2017 01/10/2017 11/17/2016 12/18/2015  Decreased Interest 0 0 0 0 0  Down, Depressed, Hopeless 0 0 0 0 0  PHQ - 2 Score 0 0 0 0 0   Fall Risk  02/07/2017 01/19/2017 01/10/2017 11/17/2016 12/18/2015  Falls in the past year? No No No No No        Assessment & Plan:   1. Dizziness    -improving yet persistent; MRI/MRA negative; carotid dopplers negative; labs negative.  -due to persistence in elderly patient, will refer to neurology. -continue Eply maneuvers and Meclizine. -patient refused flu vaccine.  Orders Placed This Encounter  Procedures  . Ambulatory referral to Neurology    Referral Priority:   Routine    Referral Type:   Consultation    Referral Reason:   Specialty Services Required    Requested Specialty:   Neurology    Number of Visits Requested:   1   No orders of the defined types were placed in this encounter.   No Follow-up on file.   Kristi Elayne Guerin, M.D. Primary Care at [facility] previously Urgent [employer] [address] [place], [place]  [address] [phone] phone [phone] fax

## 2017-02-07 NOTE — Patient Instructions (Addendum)
   Dr. Valetta Fuller Neurology Dr. Minus Liberty Neurology  IF you received an x-ray today, you will receive an invoice from [facility] Radiology. Please contact [facility] Radiology at [phone] with questions or concerns regarding your invoice.   IF you received labwork today, you will receive an invoice from [facility]. Please contact [facility] at [phone] with questions or concerns regarding your invoice.   Our billing staff will not be able to assist you with questions regarding bills from these companies.  You will be contacted with the lab results as soon as they are available. The fastest way to get your results is to activate your My Chart account. Instructions are located on the last page of this paperwork. If you have not heard from [place] regarding the results in 2 weeks, please contact this office.

## 2017-02-10 ENCOUNTER — Encounter: Payer: Self-pay | Admitting: Family Medicine

## 2017-02-20 ENCOUNTER — Telehealth: Payer: Self-pay | Admitting: Family Medicine

## 2017-02-20 NOTE — Telephone Encounter (Signed)
Pt was calling to let Dr. Tamala Julian know that dr Tomi Likens is at [place] not [place] and when he called to schedule with Tomi Likens it would have been January and he called back to [place] and made an appt with Dr. Jannifer Franklin for 04/11/17  Best number [phone]

## 2017-02-24 ENCOUNTER — Encounter: Payer: Self-pay | Admitting: Family Medicine

## 2017-02-24 ENCOUNTER — Ambulatory Visit (INDEPENDENT_AMBULATORY_CARE_PROVIDER_SITE_OTHER): Payer: Medicare Other | Admitting: Family Medicine

## 2017-02-24 VITALS — BP 132/70 | HR 70 | Temp 98.2°F | Resp 16 | Ht 67.32 in | Wt 175.0 lb

## 2017-02-24 DIAGNOSIS — IMO0001 Reserved for inherently not codable concepts without codable children: Secondary | ICD-10-CM

## 2017-02-24 DIAGNOSIS — S73199A Other sprain of unspecified hip, initial encounter: Secondary | ICD-10-CM | POA: Diagnosis not present

## 2017-02-24 DIAGNOSIS — H8113 Benign paroxysmal vertigo, bilateral: Secondary | ICD-10-CM

## 2017-02-24 DIAGNOSIS — S76399A Other specified injury of muscle, fascia and tendon of the posterior muscle group at thigh level, unspecified thigh, initial encounter: Principal | ICD-10-CM

## 2017-02-24 NOTE — Patient Instructions (Addendum)
IF you received an x-ray today, you will receive an invoice from [facility] Radiology. Please contact [facility] Radiology at [phone] with questions or concerns regarding your invoice.   IF you received labwork today, you will receive an invoice from [facility]. Please contact [facility] at [phone] with questions or concerns regarding your invoice.   Our billing staff will not be able to assist you with questions regarding bills from these companies.  You will be contacted with the lab results as soon as they are available. The fastest way to get your results is to activate your My Chart account. Instructions are located on the last page of this paperwork. If you have not heard from [place] regarding the results in 2 weeks, please contact this office.      Hamstring Strain A hamstring strain is an injury that occurs when the hamstring muscles are overstretched or overloaded. The hamstring muscles are a group of muscles at the back of the thighs. These muscles are used in straightening the hips, bending the knees, and pulling back the legs. This type of injury is often called a pulled hamstring muscle. The severity of a muscle strain is rated in degrees. First-degree strains have the least amount of muscle fiber tearing and pain. Second-degree and third-degree strains have increasingly more tearing and pain. What are the causes? Hamstring strains occur when a sudden, violent force is placed on these muscles and stretches them too far. This often occurs during activities that involve running, jumping, kicking, or weight lifting. What increases the risk? Hamstring strains are especially common in athletes. Other things that can increase your risk for this injury include:  Having low strength, endurance, or flexibility of the hamstring muscles.  Performing high-impact physical activity.  Having poor physical fitness.  Having a previous leg injury.  Having fatigued muscles.  Older  age.  What are the signs or symptoms?  Pain in the back of the thigh.  Bruising.  Swelling.  Muscle spasm.  Difficulty using the muscle because of pain or lack of normal function. For severe strains, you may have a popping or snapping feeling when the injury occurs. How is this diagnosed? Your health care provider will perform a physical exam and ask about your medical history. How is this treated? Often, the best treatment for a hamstring strain is protecting, resting, icing, applying compression, and elevating the injured area. This is referred to as the PRICE method of treatment. Your health care provider may also recommend medicines to help reduce pain or inflammation. Follow these instructions at home:  Use the PRICE method of treatment to promote muscle healing during the first 2-3 days after your injury. The PRICE method involves: ? P-Protecting the muscle from being injured again. ? R-Restricting your activity and resting the injured body part. ? I-Icing your injury. To do this, put ice in a plastic bag. Place a towel between your skin and the bag. Then, apply the ice and leave it on for 20 minutes, 2-3 times per day. After the third day, switch to moist heat packs. ? C-Applying compression to the injured area with an elastic bandage. Be careful not to wrap it too tightly. That may interfere with blood circulation or may increase swelling. ? E-Elevating the injured body part above the level of your heart as often as you can. You can do this by putting a pillow under your thigh when you sit or lie down.  Take medicines only as directed by your health care  provider.  Begin exercising or stretching as directed by your health care provider.  Do not return to full activity level until your health care provider approves.  Keep all follow-up visits as directed by your health care provider. This is important. Contact a health care provider if:  You have increasing pain or swelling  in the injured area.  You have numbness, tingling, or a significant loss of strength in the injured area.  Your foot or your toes become cold or turn blue. This information is not intended to replace advice given to you by your health care provider. Make sure you discuss any questions you have with your health care provider. Document Released: 02/07/2001 Document Revised: 10/21/2015 Document Reviewed: 12/29/2013 Elsevier Interactive Patient Education  2018 [employer].
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
## 2017-04-04 DIAGNOSIS — H401132 Primary open-angle glaucoma, bilateral, moderate stage: Secondary | ICD-10-CM | POA: Diagnosis not present

## 2017-04-09 ENCOUNTER — Ambulatory Visit (INDEPENDENT_AMBULATORY_CARE_PROVIDER_SITE_OTHER): Payer: Medicare Other

## 2017-04-09 VITALS — BP 136/80 | HR 97 | Ht 68.0 in | Wt 175.5 lb

## 2017-04-09 DIAGNOSIS — Z0000 Encounter for general adult medical examination without abnormal findings: Secondary | ICD-10-CM | POA: Diagnosis not present

## 2017-04-09 NOTE — Progress Notes (Signed)
Subjective:   Guy Hutchinson is a 81 y.o. male who presents for Medicare Annual/Subsequent preventive examination.  Review of Systems:  N/A Cardiac Risk Factors include: advanced age (>20men, >45 women);male gender;dyslipidemia;sedentary lifestyle     Objective:    Vitals: BP 136/80   Pulse 97   Ht 5\' 8"  (1.727 m)   Wt 175 lb 8 oz (79.6 kg)   SpO2 97%   BMI 26.68 kg/m   Body mass index is 26.68 kg/m.  Tobacco Social History   Tobacco Use  Smoking Status Never Smoker  Smokeless Tobacco Never Used     Counseling given: Not Answered   Past Medical History:  Diagnosis Date  . Cataract   . Glaucoma   . Prostate cancer [facility])    Past Surgical History:  Procedure Laterality Date  . PROSTATE SURGERY  2007   Family History  Problem Relation Age of Onset  . Diabetes Mother   . Heart disease Brother    Social History   Substance and Sexual Activity  Sexual Activity Not on file    Outpatient Encounter Medications as of 04/09/2017  Medication Sig  . ALFALFA PO Take 1 capsule by mouth every morning.  . brimonidine-timolol (COMBIGAN) 0.2-0.5 % ophthalmic solution Place 1 drop into both eyes every 12 (twelve) hours.  Marland Kitchen DANDELION PO Take 1 capsule by mouth every morning.  Marland Kitchen GARLIC PO Take 1 capsule by mouth 3 (three) times daily before meals.  Marland Kitchen MILK THISTLE PO Take 1 capsule by mouth every morning.  . Multiple Vitamin (MULTIVITAMIN WITH MINERALS) TABS Take 1 tablet by mouth daily.  Marland Kitchen OVER THE COUNTER MEDICATION Take 1 capsule by mouth every morning. OTC supplement Pyginal (antioxidant)  . OVER THE COUNTER MEDICATION Take 1 capsule by mouth every morning. OTC acai supplement  . OVER THE COUNTER MEDICATION Take 1 capsule by mouth every morning. OTC tumeric supplement   No facility-administered encounter medications on file as of 04/09/2017.     Activities of Daily Living In your present state of health, do you have any difficulty performing the following  activities: 04/09/2017  Hearing? N  Vision? Y  Comment Patient has cataract and glaucoma  Difficulty concentrating or making decisions? N  Walking or climbing stairs? N  Dressing or bathing? N  Doing errands, shopping? N  Preparing Food and eating ? N  Using the Toilet? N  In the past six months, have you accidently leaked urine? N  Do you have problems with loss of bowel control? N  Managing your Medications? N  Managing your Finances? N  Housekeeping or managing your Housekeeping? N  Some recent data might be hidden    Patient Care Team: Wardell Honour, MD as PCP - General (Family Medicine) Melissa Noon, [place] as Referring Physician (Optometry)   Assessment:     Exercise Activities and Dietary recommendations Current Exercise Habits: Home exercise routine, Time (Minutes): 60, Frequency (Times/Week): 2, Weekly Exercise (Minutes/Week): 120, Intensity: Mild, Exercise limited by: None identified  Goals    None     Fall Risk Fall Risk  04/09/2017 02/24/2017 02/07/2017 01/19/2017 01/10/2017  Falls in the past year? Yes Yes No No No  Number falls in past yr: 1 2 or more - - -  Injury with Fall? Yes No - - -  Comment Patient sprained his hamstring - - - -  Risk for fall due to : Other (Comment) - - - -  Risk for fall due to: Comment Patient tripped  over something - - - -  Follow up Falls prevention discussed - - - -   Depression Screen PHQ 2/9 Scores 04/09/2017 02/24/2017 02/07/2017 01/19/2017  PHQ - 2 Score 0 0 0 0    Cognitive Function     6CIT Screen 04/09/2017  What Year? 0 points  What month? 0 points  What time? 0 points  Count back from 20 0 points  Months in reverse 0 points  Repeat phrase 2 points  Total Score 2    Immunization History  Administered Date(s) Administered  . Influenza-Unspecified 02/26/2015  . Tdap 05/27/2012   Screening Tests Health Maintenance  Topic Date Due  . INFLUENZA VACCINE  08/26/2017 (Originally 12/27/2016)  . PNA vac Low Risk Adult  (1 of 2 - PCV13) 04/09/2018 (Originally 10/22/2002)  . TETANUS/TDAP  05/27/2022      Plan:   I have personally reviewed and noted the following in the patient's chart:   . Medical and social history . Use of alcohol, tobacco or illicit drugs  . Current medications and supplements . Functional ability and status . Nutritional status . Physical activity . Advanced directives . List of other physicians . Hospitalizations, surgeries, and ER visits in previous 12 months . Vitals . Screenings to include cognitive, depression, and falls . Referrals and appointments  In addition, I have reviewed and discussed with patient certain preventive protocols, quality metrics, and best practice recommendations. A written personalized care plan for preventive services as well as general preventive health recommendations were provided to patient.    Patient declined flu and pneumonia vaccines  1. Encounter for Medicare annual wellness exam  Andrez Grime, LPN  79/[id number]/0569

## 2017-04-09 NOTE — Patient Instructions (Addendum)
Mr. Guy Hutchinson , Thank you for taking time to come for your Medicare Wellness Visit. I appreciate your ongoing commitment to your health goals. Please review the following plan we discussed and let me know if I can assist you in the future.   Screening recommendations/referrals: Colonoscopy: stops after age 80 Recommended yearly ophthalmology/optometry visit for glaucoma screening and checkup Recommended yearly dental visit for hygiene and checkup   Vaccinations: Influenza vaccine: declined   Pneumococcal vaccine: declined Tdap vaccine: up to date, next due 05/27/2022  Shingles vaccine: declined    Advanced directives: Please bring a copy of your POA (Power of [place]) and/or Living Will to your next appointment.   Conditions/risks identified: Try to increase your water intake daily. This will help with overall hydration.   Next appointment: schedule follow up visit with PCP, 1 year for AWV  Preventive Care 43 Years and Older, Male Preventive care refers to lifestyle choices and visits with your health care provider that can promote health and wellness. What does preventive care include?  A yearly physical exam. This is also called an annual well check.  Dental exams once or twice a year.  Routine eye exams. Ask your health care provider how often you should have your eyes checked.  Personal lifestyle choices, including:  Daily care of your teeth and gums.  Regular physical activity.  Eating a healthy diet.  Avoiding tobacco and drug use.  Limiting alcohol use.  Practicing safe sex.  Taking low doses of aspirin every day.  Taking vitamin and mineral supplements as recommended by your health care provider. What happens during an annual well check? The services and screenings done by your health care provider during your annual well check will depend on your age, overall health, lifestyle risk factors, and family history of disease. Counseling  Your health care provider  may ask you questions about your:  Alcohol use.  Tobacco use.  Drug use.  Emotional well-being.  Home and relationship well-being.  Sexual activity.  Eating habits.  History of falls.  Memory and ability to understand (cognition).  Work and work Statistician. Screening  You may have the following tests or measurements:  Height, weight, and BMI.  Blood pressure.  Lipid and cholesterol levels. These may be checked every 5 years, or more frequently if you are over 62 years old.  Skin check.  Lung cancer screening. You may have this screening every year starting at age 84 if you have a 30-pack-year history of smoking and currently smoke or have quit within the past 15 years.  Fecal occult blood test (FOBT) of the stool. You may have this test every year starting at age 91.  Flexible sigmoidoscopy or colonoscopy. You may have a sigmoidoscopy every 5 years or a colonoscopy every 10 years starting at age 69.  Prostate cancer screening. Recommendations will vary depending on your family history and other risks.  Hepatitis C blood test.  Hepatitis B blood test.  Sexually transmitted disease (STD) testing.  Diabetes screening. This is done by checking your blood sugar (glucose) after you have not eaten for a while (fasting). You may have this done every 1-3 years.  Abdominal aortic aneurysm (AAA) screening. You may need this if you are a current or former smoker.  Osteoporosis. You may be screened starting at age 20 if you are at high risk. Talk with your health care provider about your test results, treatment options, and if necessary, the need for more tests. Vaccines  Your health care  provider may recommend certain vaccines, such as:  Influenza vaccine. This is recommended every year.  Tetanus, diphtheria, and acellular pertussis (Tdap, Td) vaccine. You may need a Td booster every 10 years.  Zoster vaccine. You may need this after age 47.  Pneumococcal 13-valent  conjugate (PCV13) vaccine. One dose is recommended after age 31.  Pneumococcal polysaccharide (PPSV23) vaccine. One dose is recommended after age 50. Talk to your health care provider about which screenings and vaccines you need and how often you need them. This information is not intended to replace advice given to you by your health care provider. Make sure you discuss any questions you have with your health care provider. Document Released: 06/11/2015 Document Revised: 02/02/2016 Document Reviewed: 03/16/2015 Elsevier Interactive Patient Education  2017 [place] Prevention in the Home Falls can cause injuries. They can happen to people of all ages. There are many things you can do to make your home safe and to help prevent falls. What can I do on the outside of my home?  Regularly fix the edges of walkways and driveways and fix any cracks.  Remove anything that might make you trip as you walk through a door, such as a raised step or threshold.  Trim any bushes or trees on the path to your home.  Use bright outdoor lighting.  Clear any walking paths of anything that might make someone trip, such as rocks or tools.  Regularly check to see if handrails are loose or broken. Make sure that both sides of any steps have handrails.  Any raised decks and porches should have guardrails on the edges.  Have any leaves, snow, or ice cleared regularly.  Use sand or salt on walking paths during winter.  Clean up any spills in your garage right away. This includes oil or grease spills. What can I do in the bathroom?  Use night lights.  Install grab bars by the toilet and in the tub and shower. Do not use towel bars as grab bars.  Use non-skid mats or decals in the tub or shower.  If you need to sit down in the shower, use a plastic, non-slip stool.  Keep the floor dry. Clean up any water that spills on the floor as soon as it happens.  Remove soap buildup in the tub or  shower regularly.  Attach bath mats securely with double-sided non-slip rug tape.  Do not have throw rugs and other things on the floor that can make you trip. What can I do in the bedroom?  Use night lights.  Make sure that you have a light by your bed that is easy to reach.  Do not use any sheets or blankets that are too big for your bed. They should not hang down onto the floor.  Have a firm chair that has side arms. You can use this for support while you get dressed.  Do not have throw rugs and other things on the floor that can make you trip. What can I do in the kitchen?  Clean up any spills right away.  Avoid walking on wet floors.  Keep items that you use a lot in easy-to-reach places.  If you need to reach something above you, use a strong step stool that has a grab bar.  Keep electrical cords out of the way.  Do not use floor polish or wax that makes floors slippery. If you must use wax, use non-skid floor wax.  Do not have throw  rugs and other things on the floor that can make you trip. What can I do with my stairs?  Do not leave any items on the stairs.  Make sure that there are handrails on both sides of the stairs and use them. Fix handrails that are broken or loose. Make sure that handrails are as long as the stairways.  Check any carpeting to make sure that it is firmly attached to the stairs. Fix any carpet that is loose or worn.  Avoid having throw rugs at the top or bottom of the stairs. If you do have throw rugs, attach them to the floor with carpet tape.  Make sure that you have a light switch at the top of the stairs and the bottom of the stairs. If you do not have them, ask someone to add them for you. What else can I do to help prevent falls?  Wear shoes that:  Do not have high heels.  Have rubber bottoms.  Are comfortable and fit you well.  Are closed at the toe. Do not wear sandals.  If you use a stepladder:  Make sure that it is fully  opened. Do not climb a closed stepladder.  Make sure that both sides of the stepladder are locked into place.  Ask someone to hold it for you, if possible.  Clearly mark and make sure that you can see:  Any grab bars or handrails.  First and last steps.  Where the edge of each step is.  Use tools that help you move around (mobility aids) if they are needed. These include:  Canes.  Walkers.  Scooters.  Crutches.  Turn on the lights when you go into a dark area. Replace any light bulbs as soon as they burn out.  Set up your furniture so you have a clear path. Avoid moving your furniture around.  If any of your floors are uneven, fix them.  If there are any pets around you, be aware of where they are.  Review your medicines with your doctor. Some medicines can make you feel dizzy. This can increase your chance of falling. Ask your doctor what other things that you can do to help prevent falls. This information is not intended to replace advice given to you by your health care provider. Make sure you discuss any questions you have with your health care provider. Document Released: 03/11/2009 Document Revised: 10/21/2015 Document Reviewed: 06/19/2014 Elsevier Interactive Patient Education  2017 [employer].

## 2017-04-11 ENCOUNTER — Ambulatory Visit: Payer: Medicare Other | Admitting: Neurology

## 2017-04-11 ENCOUNTER — Encounter: Payer: Self-pay | Admitting: Neurology

## 2017-04-11 DIAGNOSIS — R42 Dizziness and giddiness: Secondary | ICD-10-CM

## 2017-04-11 DIAGNOSIS — H81399 Other peripheral vertigo, unspecified ear: Secondary | ICD-10-CM

## 2017-04-11 HISTORY — DX: Dizziness and giddiness: R42

## 2017-04-11 NOTE — Progress Notes (Signed)
Reason for visit: Dizziness  Referring physician: Dr. Kipp Laurence is a 81 y.o. male  History of present illness:  Guy Hutchinson is a 81 year old right-handed white male with a history of onset of dizziness that occurred around 27 January 2017.  The patient noted onset when he got up out of bed, he got lightheaded, but he does not report true vertigo.  He indicates that if he stays still the dizziness will disappear within 10-15 seconds.  The patient might note some dizziness if he stoops over and comes back up again.  He does not have dizziness or vertigo with rolling in bed.  He did the Epley maneuvers, he was not sure that this helped him any.  He has had improvement in the severity of the dizziness over time, he finds that his symptoms are not intolerable.  He is able to perform all activities of daily living, he plays golf, the dizziness does not bother him or keep him from doing what he wants to do.  The patient reports no hearing changes, he does have some tinnitus in the right ear at times.  He denies any slurred speech, difficulty swallowing, or double vision or loss of vision.  The patient reports no numbness or weakness of the face, arms, or legs.  He has not had any falls.  He denies issues controlling the bowels or the bladder.  He denies headaches or problems with memory or confusion.  In the past, he has been drinking up to 4 glasses of wine a day, he has cut back some on this.  The patient is sent to this office for an evaluation.  MRI angiogram of the head was unremarkable, carotid Doppler study was unremarkable.  He has not undergone MRI of the brain.  Past Medical History:  Diagnosis Date  . Cataract   . Glaucoma   . Prostate cancer [facility])     Past Surgical History:  Procedure Laterality Date  . PROSTATE SURGERY  2007   Impant    Family History  Problem Relation Age of Onset  . Diabetes Mother   . Heart disease Brother     Social history:  reports that   has never smoked. he has never used smokeless tobacco. He reports that he drinks about 8.4 oz of alcohol per week. He reports that he does not use drugs.  Medications:  Prior to Admission medications   Medication Sig Start Date End Date Taking? Authorizing Provider  ALFALFA PO Take 1 capsule by mouth every morning.   Yes [provider]  brimonidine-timolol (COMBIGAN) 0.2-0.5 % ophthalmic solution Place 1 drop into both eyes every 12 (twelve) hours.   Yes [provider]  DANDELION PO Take 1 capsule by mouth every morning.   Yes [provider]  GARLIC PO Take 1 capsule by mouth 3 (three) times daily before meals.   Yes [provider]  MILK THISTLE PO Take 1 capsule by mouth every morning.   Yes [provider]  Multiple Vitamin (MULTIVITAMIN WITH MINERALS) TABS Take 1 tablet by mouth daily.   Yes [provider]  OLIVE LEAF PO Take 400 mg every other day by mouth.   Yes [provider]  OVER THE COUNTER MEDICATION Take 1 capsule by mouth every morning. OTC supplement Pyginal (antioxidant)   Yes [provider]  OVER THE COUNTER MEDICATION Take 1 capsule by mouth every morning. OTC acai supplement   Yes [provider]  OVER THE COUNTER MEDICATION Take 1 capsule by mouth every morning. OTC tumeric supplement   Yes [provider]  VALERIAN PO Take 350 mg at bedtime by mouth.   Yes [provider]     No Known Allergies  ROS:  Out of a complete 14 system review of symptoms, the patient complains only of the following symptoms, and all other reviewed systems are negative.  Dizziness Not enough sleep  Blood pressure (!) 158/78, pulse 77, height 5\' 10"  (1.778 m), weight 175 lb (79.4 kg).   Blood pressure, right arm, sitting is 160/80.  Blood pressure, right arm, standing is 140/82.  Physical Exam  General: The patient is alert and cooperative at the time of the examination.  Eyes: Pupils  are equal, round, and reactive to light. Discs are flat bilaterally.  Ears: Tympanic membranes are clear bilaterally.  Neck: The neck is supple, no carotid bruits are noted.  Respiratory: The respiratory examination is clear.  Cardiovascular: The cardiovascular examination reveals a regular rate and rhythm, a grade I/VI systolic ejection murmur at the left lower sternal border was noted.  Skin: Extremities are without significant edema.  Neurologic Exam  Mental status: The patient is alert and oriented x 3 at the time of the examination. The patient has apparent normal recent and remote memory, with an apparently normal attention span and concentration ability.  Cranial nerves: Facial symmetry is present. There is good sensation of the face to pinprick and soft touch bilaterally. The strength of the facial muscles and the muscles to head turning and shoulder shrug are normal bilaterally. Speech is well enunciated, no aphasia or dysarthria is noted. Extraocular movements are full. Visual fields are full. The tongue is midline, and the patient has symmetric elevation of the soft palate. No obvious hearing deficits are noted.  Motor: The motor testing reveals 5 over 5 strength of all 4 extremities. Good symmetric motor tone is noted throughout.  Sensory: Sensory testing is intact to pinprick, soft touch, vibration sensation, and position sense on all 4 extremities. No evidence of extinction is noted.  Coordination: Cerebellar testing reveals good finger-nose-finger and heel-to-shin bilaterally. The Nyan-Barrany procedure was done, no dizziness was elicited.  The patient has and gaze nystagmus bilaterally that is symmetric.  Gait and station: Gait is normal. Tandem gait is normal for age. Romberg is negative. No drift is seen.  Reflexes: Deep tendon reflexes are symmetric and normal bilaterally. Toes are downgoing bilaterally.    MRA head 01/31/17:  IMPRESSION: Normal examination. No  vascular pathology identified to explain Dizziness.  * MRI scan images were reviewed online. I agree with the written report.   Carotid doppler 01/12/17:  Heterogeneous plaque, bilaterally. 1-39% bilateral ICA stenosis. Normal subclavian arteries, bilaterally. Patent vertebral arteries with antegrade flow  Assessment/Plan:  1.  Episodic dizziness  The patient has had documentation of normal cerebrovascular circulation to the brain.  Evaluation of the brain itself has not been done, this will be set up.  The patient is having minimal symptoms of dizziness that occur with a change in head position, positional vertigo has not been documented during the examination today.  The patient does not report true vertigo sensations.  The patient is doing fairly well with minimal symptoms currently.  If the MRI of the brain is unremarkable, no further evaluation or treatment will be offered.  The patient will contact our office if the symptoms change.  Guy Alexanders MD 04/11/2017 11:16 AM  [facility] [phone]  [address] [place], [place] [address]  Phone [phone] Fax [phone]

## 2017-04-11 NOTE — Patient Instructions (Signed)
    We will check MRI of the brain. 

## 2017-04-24 ENCOUNTER — Telehealth: Payer: Self-pay

## 2017-04-24 NOTE — Telephone Encounter (Signed)
-----   Message from Stann Ore sent at 04/23/2017  9:31 AM EST ----- Regarding: Medication Request Pt called requesting that Dr. Tamala Julian call in something for his anxiety for his upcoming MRI on 04/28/17 at 1:30. He expressed a lot of upcoming anxiety about the MRI. Pt says he uses [facility] on [employer].    Josepha Pigg, B.A.  Care Guide - [facility] at [place]

## 2017-04-25 ENCOUNTER — Telehealth: Payer: Self-pay

## 2017-04-25 MED ORDER — ALPRAZOLAM 0.5 MG PO TABS: ORAL_TABLET | ORAL | 0 refills | Status: DC

## 2017-04-25 NOTE — Telephone Encounter (Signed)
I will send in some alprazolam for the MRI study.

## 2017-04-25 NOTE — Telephone Encounter (Signed)
Copied from [place] [id number]. Topic: Inquiry >> Apr 25, 2017 10:10 AM Conception Chancy, NT wrote: Reason for CRM: pt states he is having a MRI on Saturday, and he is wanting a low dose of vallum so he can have it for his anxiety before his procedure. Pt would like a call back when it is called in. If it can not be called in patient would like a call back

## 2017-04-25 NOTE — Addendum Note (Signed)
Addended by: Kathrynn Ducking on: 04/25/2017 04:57 PM   Modules accepted: Orders

## 2017-04-25 NOTE — Telephone Encounter (Signed)
Pt called Dr. Reginia Forts for: Needs med for anxiety-upcoming MRI Dr. Jannifer Franklin ordered MRI. Forwarded to Dr. Jannifer Franklin for med pres

## 2017-04-26 ENCOUNTER — Telehealth: Payer: Self-pay | Admitting: *Deleted

## 2017-04-26 NOTE — Telephone Encounter (Signed)
Dr. Jannifer Franklin of neurology ordered MRI and sent in Xanax for patient this morning.  No action warranted at this time.

## 2017-04-26 NOTE — Telephone Encounter (Signed)
Faxed printed/signed rx alprazolam to [facility] at [phone]. Received fax confirmation.

## 2017-04-28 ENCOUNTER — Other Ambulatory Visit: Payer: Medicare Other

## 2017-04-28 ENCOUNTER — Ambulatory Visit
Admission: RE | Admit: 2017-04-28 | Discharge: 2017-04-28 | Disposition: A | Discharge status: Home / Self Care | Payer: Medicare Other | Source: Ambulatory Visit | Attending: Neurology | Admitting: Neurology

## 2017-04-28 DIAGNOSIS — R42 Dizziness and giddiness: Secondary | ICD-10-CM

## 2017-04-30 ENCOUNTER — Telehealth: Payer: Self-pay | Admitting: Neurology

## 2017-04-30 NOTE — Telephone Encounter (Signed)
I called the patient.  MRI of the brain shows a moderate level of small vessel disease, no acute changes were seen.  The patient is to go on low-dose aspirin.  He was slightly hypertensive during his last visit, this needs to be monitored.  If the blood pressure remains elevated, management of this may markedly reduce the risk for progressive changes with small vessel disease.   MRI brain 04/29/17:  IMPRESSION:  This MRI of the brain without contrast shows the following: 1.   Extensive T2/FLAIR hyperintense foci in the hemispheres and pons consistent with advanced chronic microvascular ischemic change. None of the foci appeared to be acute. 2.    Mild generalized cortical atrophy. 3.   There are no acute findings.

## 2017-07-19 DIAGNOSIS — H401132 Primary open-angle glaucoma, bilateral, moderate stage: Secondary | ICD-10-CM | POA: Diagnosis not present

## 2017-10-17 DIAGNOSIS — H401132 Primary open-angle glaucoma, bilateral, moderate stage: Secondary | ICD-10-CM | POA: Diagnosis not present

## 2017-10-19 ENCOUNTER — Encounter

## 2017-10-19 ENCOUNTER — Ambulatory Visit (INDEPENDENT_AMBULATORY_CARE_PROVIDER_SITE_OTHER): Payer: Medicare Other | Admitting: Family Medicine

## 2017-10-19 ENCOUNTER — Other Ambulatory Visit: Payer: Self-pay

## 2017-10-19 ENCOUNTER — Encounter: Payer: Self-pay | Admitting: Family Medicine

## 2017-10-19 VITALS — BP 130/68 | HR 68 | Temp 98.0°F | Resp 16 | Wt 167.0 lb

## 2017-10-19 DIAGNOSIS — R630 Anorexia: Secondary | ICD-10-CM

## 2017-10-19 DIAGNOSIS — C61 Malignant neoplasm of prostate: Secondary | ICD-10-CM

## 2017-10-19 DIAGNOSIS — E7800 Pure hypercholesterolemia, unspecified: Secondary | ICD-10-CM | POA: Diagnosis not present

## 2017-10-19 DIAGNOSIS — R634 Abnormal weight loss: Secondary | ICD-10-CM

## 2017-10-19 DIAGNOSIS — C4442 Squamous cell carcinoma of skin of scalp and neck: Secondary | ICD-10-CM | POA: Insufficient documentation

## 2017-10-19 DIAGNOSIS — Z0000 Encounter for general adult medical examination without abnormal findings: Secondary | ICD-10-CM | POA: Diagnosis not present

## 2017-10-19 DIAGNOSIS — R42 Dizziness and giddiness: Secondary | ICD-10-CM

## 2017-10-19 DIAGNOSIS — H903 Sensorineural hearing loss, bilateral: Secondary | ICD-10-CM

## 2017-10-19 NOTE — Patient Instructions (Addendum)
   IF you received an x-ray today, you will receive an invoice from [facility]. Please contact [facility] at [phone] with questions or concerns regarding your invoice.   IF you received labwork today, you will receive an invoice from [facility]. Please contact [facility] at [phone] with questions or concerns regarding your invoice.   Our billing staff will not be able to assist you with questions regarding bills from these companies.  You will be contacted with the lab results as soon as they are available. The fastest way to get your results is to activate your My Chart account. Instructions are located on the last page of this paperwork. If you have not heard from us regarding the results in 2 weeks, please contact this office.      Preventive Care 65 Years and Older, Male Preventive care refers to lifestyle choices and visits with your health care provider that can promote health and wellness. What does preventive care include?  A yearly physical exam. This is also called an annual well check.  Dental exams once or twice a year.  Routine eye exams. Ask your health care provider how often you should have your eyes checked.  Personal lifestyle choices, including: ? Daily care of your teeth and gums. ? Regular physical activity. ? Eating a healthy diet. ? Avoiding tobacco and drug use. ? Limiting alcohol use. ? Practicing safe sex. ? Taking low doses of aspirin every day. ? Taking vitamin and mineral supplements as recommended by your health care provider. What happens during an annual well check? The services and screenings done by your health care provider during your annual well check will depend on your age, overall health, lifestyle risk factors, and family history of disease. Counseling Your health care provider may ask you questions about your:  Alcohol use.  Tobacco use.  Drug use.  Emotional well-being.  Home and relationship  well-being.  Sexual activity.  Eating habits.  History of falls.  Memory and ability to understand (cognition).  Work and work environment.  Screening You may have the following tests or measurements:  Height, weight, and BMI.  Blood pressure.  Lipid and cholesterol levels. These may be checked every 5 years, or more frequently if you are over 50 years old.  Skin check.  Lung cancer screening. You may have this screening every year starting at age 55 if you have a 30-pack-year history of smoking and currently smoke or have quit within the past 15 years.  Fecal occult blood test (FOBT) of the stool. You may have this test every year starting at age 50.  Flexible sigmoidoscopy or colonoscopy. You may have a sigmoidoscopy every 5 years or a colonoscopy every 10 years starting at age 50.  Prostate cancer screening. Recommendations will vary depending on your family history and other risks.  Hepatitis C blood test.  Hepatitis B blood test.  Sexually transmitted disease (STD) testing.  Diabetes screening. This is done by checking your blood sugar (glucose) after you have not eaten for a while (fasting). You may have this done every 1-3 years.  Abdominal aortic aneurysm (AAA) screening. You may need this if you are a current or former smoker.  Osteoporosis. You may be screened starting at age 70 if you are at high risk.  Talk with your health care provider about your test results, treatment options, and if necessary, the need for more tests. Vaccines Your health care provider may recommend certain vaccines, such as:  Influenza vaccine. This   is recommended every year.  Tetanus, diphtheria, and acellular pertussis (Tdap, Td) vaccine. You may need a Td booster every 10 years.  Varicella vaccine. You may need this if you have not been vaccinated.  Zoster vaccine. You may need this after age 60.  Measles, mumps, and rubella (MMR) vaccine. You may need at least one dose of  MMR if you were born in 1957 or later. You may also need a second dose.  Pneumococcal 13-valent conjugate (PCV13) vaccine. One dose is recommended after age 65.  Pneumococcal polysaccharide (PPSV23) vaccine. One dose is recommended after age 65.  Meningococcal vaccine. You may need this if you have certain conditions.  Hepatitis A vaccine. You may need this if you have certain conditions or if you travel or work in places where you may be exposed to hepatitis A.  Hepatitis B vaccine. You may need this if you have certain conditions or if you travel or work in places where you may be exposed to hepatitis B.  Haemophilus influenzae type b (Hib) vaccine. You may need this if you have certain risk factors.  Talk to your health care provider about which screenings and vaccines you need and how often you need them. This information is not intended to replace advice given to you by your health care provider. Make sure you discuss any questions you have with your health care provider. Document Released: 06/11/2015 Document Revised: 02/02/2016 Document Reviewed: 03/16/2015 Elsevier Interactive Patient Education  2018 Elsevier Inc.  

## 2017-10-19 NOTE — Progress Notes (Signed)
Subjective:    Patient ID: Guy Hutchinson, male    DOB: 1935/09/29, 82 y.o.   MRN: [id number]  10/19/2017  Annual Exam    HPI This 82 y.o. male presents for COMPLETE PHYSICAL EXAMINATION.  Last physical:  AWV 04-09-2017 Calandra LPN   Has lost weight; appetite decreased. MRI brain shows diffuse ischemic changes.  Continues to suffer with dizziness.  Popping in ears. Would like ears evaluated.  BP Readings from Last 3 Encounters:  12/03/17 138/72  11/03/17 (!) 160/70  10/19/17 130/68   Wt Readings from Last 3 Encounters:  12/03/17 164 lb 6.4 oz (74.6 kg)  11/03/17 172 lb (78 kg)  10/19/17 167 lb (75.8 kg)   Immunization History  Administered Date(s) Administered  . Influenza-Unspecified 02/26/2015  . Tdap 05/27/2012   Health Maintenance  Topic Date Due  . PNA vac Low Risk Adult (1 of 2 - PCV13) 04/09/2018 (Originally 10/22/2002)  . INFLUENZA VACCINE  12/27/2017  . TETANUS/TDAP  05/27/2022    Review of Systems  Constitutional: Negative for activity change, appetite change, chills, diaphoresis, fatigue, fever and unexpected weight change.  HENT: Positive for sneezing. Negative for congestion, dental problem, drooling, ear discharge, ear pain, facial swelling, hearing loss, mouth sores, nosebleeds, postnasal drip, rhinorrhea, sinus pressure, sore throat, tinnitus, trouble swallowing and voice change.   Eyes: Negative for photophobia, pain, discharge, redness, itching and visual disturbance.  Respiratory: Negative for apnea, cough, choking, chest tightness, shortness of breath, wheezing and stridor.   Cardiovascular: Negative for chest pain, palpitations and leg swelling.  Gastrointestinal: Negative for abdominal distention, abdominal pain, anal bleeding, blood in stool, constipation, diarrhea, nausea and vomiting.  Endocrine: Negative for cold intolerance, heat intolerance, polydipsia, polyphagia and polyuria.  Genitourinary: Negative for decreased urine volume,  difficulty urinating, discharge, dysuria, enuresis, flank pain, frequency, genital sores, hematuria, penile pain, penile swelling, scrotal swelling, testicular pain and urgency.       Nocturia x 2.  Urinary stream strong.   Musculoskeletal: Negative for arthralgias, back pain, gait problem, joint swelling, myalgias, neck pain and neck stiffness.  Skin: Negative for color change, pallor, rash and wound.  Allergic/Immunologic: Negative for environmental allergies, food allergies and immunocompromised state.  Neurological: Negative for dizziness, tremors, seizures, syncope, facial asymmetry, speech difficulty, weakness, light-headedness, numbness and headaches.  Hematological: Negative for adenopathy. Does not bruise/bleed easily.  Psychiatric/Behavioral: Positive for sleep disturbance. Negative for agitation, behavioral problems, confusion, decreased concentration, dysphoric mood, hallucinations, self-injury and suicidal ideas. The patient is not nervous/anxious and is not hyperactive.        Bedtime 12-1.  Wakes up 8-10.    Past Medical History:  Diagnosis Date  . Cataract   . Dizziness 04/11/2017  . Glaucoma   . Prostate cancer [facility])    Past Surgical History:  Procedure Laterality Date  . PROSTATE SURGERY  2007   Impant   No Known Allergies Current Outpatient Medications on File Prior to Visit  Medication Sig Dispense Refill  . ALFALFA PO Take 1 capsule by mouth every morning.    Marland Kitchen ALPRAZolam (XANAX) 0.5 MG tablet Take 2 tablets approximately 45 minutes prior to the MRI study, take a third tablet if needed. 3 tablet 0  . brimonidine-timolol (COMBIGAN) 0.2-0.5 % ophthalmic solution Place 1 drop into both eyes every 12 (twelve) hours.    Marland Kitchen DANDELION PO Take 1 capsule by mouth every morning.    Marland Kitchen GARLIC PO Take 1 capsule by mouth 3 (three) times daily before meals.    Marland Kitchen  MILK THISTLE PO Take 1 capsule by mouth every morning.    . Multiple Vitamin (MULTIVITAMIN WITH MINERALS) TABS Take 1  tablet by mouth daily.    Marland Kitchen OLIVE LEAF PO Take 400 mg every other day by mouth.    Marland Kitchen OVER THE COUNTER MEDICATION Take 1 capsule by mouth every morning. OTC supplement Pyginal (antioxidant)    . OVER THE COUNTER MEDICATION Take 1 capsule by mouth every morning. OTC acai supplement    . OVER THE COUNTER MEDICATION Take 1 capsule by mouth every morning. OTC tumeric supplement    . VALERIAN PO Take 350 mg at bedtime by mouth.     No current facility-administered medications on file prior to visit.    Social History   Socioeconomic History  . Marital status: Divorced    Spouse name: Not on file  . Number of children: 2  . Years of education: 16  . Highest education level: Bachelor's degree (e.g., BA, AB, BS)  Occupational History  . Occupation: Retired  Scientific laboratory technician  . Financial resource strain: Not hard at all  . Food insecurity:    Worry: Never true    Inability: Never true  . Transportation needs:    Medical: No    Non-medical: No  Tobacco Use  . Smoking status: Never Smoker  . Smokeless tobacco: Never Used  Substance and Sexual Activity  . Alcohol use: Yes    Alcohol/week: 8.4 oz    Types: 14 Cans of beer per week  . Drug use: No  . Sexual activity: Yes  Lifestyle  . Physical activity:    Days per week: 2 days    Minutes per session: 60 min  . Stress: Only a little  Relationships  . Social connections:    Talks on phone: Three times a week    Gets together: Twice a week    Attends religious service: More than 4 times per year    Active member of club or organization: No    Attends meetings of clubs or organizations: Never    Relationship status: Divorced  . Intimate partner violence:    Fear of current or ex partner: No    Emotionally abused: No    Physically abused: No    Forced sexual activity: No  Other Topics Concern  . Not on file  Social History Narrative   Marital status; Divorced.  Dating same male x 2000.      Children:      Lives:  Alone in 2019.        Tobacco: none; in 2019; quit in 1978.      Alcohol:  Drinks 2-3 miller lights per day.        Exercise:  Plays golf two days per week; walks around in malls.      ADLs; drives; performs ADLs; no assistant devices.      Advanced Directives:  None in 2019.  HCPOA:  Son/[employer].  FULL CODE; no prolonged measures.     Right handed    Family History  Problem Relation Age of Onset  . Diabetes Mother   . Heart disease Brother        Objective:    BP 130/68   Pulse 68   Temp 98 F (36.7 C) (Oral)   Resp 16   Wt 167 lb (75.8 kg)   SpO2 95%   BMI 23.96 kg/m  Physical Exam  Constitutional: He is oriented to person, place, and time. He appears  well-developed and well-nourished. No distress.  HENT:  Head: Normocephalic and atraumatic.  Right Ear: External ear normal.  Left Ear: External ear normal.  Nose: Nose normal.  Mouth/Throat: Oropharynx is clear and moist.  Eyes: Pupils are equal, round, and reactive to light. Conjunctivae and EOM are normal.  Neck: Normal range of motion. Neck supple. Carotid bruit is not present. No thyromegaly present.  Cardiovascular: Normal rate, regular rhythm, normal heart sounds and intact distal pulses. Exam reveals no gallop and no friction rub.  No murmur heard. Pulmonary/Chest: Effort normal and breath sounds normal. He has no wheezes. He has no rales.  Abdominal: Soft. Bowel sounds are normal. He exhibits no distension and no mass. There is no tenderness. There is no rebound and no guarding.  Musculoskeletal:       Right shoulder: Normal.       Left shoulder: Normal.       Cervical back: Normal.  Lymphadenopathy:    He has no cervical adenopathy.  Neurological: He is alert and oriented to person, place, and time. He has normal reflexes. No cranial nerve deficit. He exhibits normal muscle tone. Coordination normal.  Skin: Skin is warm and dry. No rash noted. He is not diaphoretic.  Psychiatric: He has a normal mood and affect. His  behavior is normal. Judgment and thought content normal.   No results found. Depression screen [facility] 2/9 12/03/2017 11/03/2017 10/19/2017 04/09/2017 02/24/2017  Decreased Interest 0 0 0 0 0  Down, Depressed, Hopeless 0 0 0 0 0  PHQ - 2 Score 0 0 0 0 0   Fall Risk  12/03/2017 11/03/2017 10/19/2017 04/09/2017 02/24/2017  Falls in the past year? No No No Yes Yes  Number falls in past yr: - - - 1 2 or more  Injury with Fall? - - - Yes No  Comment - - - Patient sprained his hamstring -  Risk for fall due to : - - - Other (Comment) -  Risk for fall due to: Comment - - - Patient tripped over something -  Follow up - - - Falls prevention discussed -        Assessment & Plan:   1. Routine physical examination   2. Pure hypercholesterolemia   3. Malignant tumor of prostate ([place]) Chronic  4. Sensorineural hearing loss (SNHL) of both ears   5. Squamous cell carcinoma of scalp   6. Decreased appetite   7. Loss of weight     -anticipatory guidance provided --- exercise, weight loss, safe driving practices, aspirin 81mg  daily. -obtain age appropriate screening labs and labs for chronic disease management. -moderate fall risk; no evidence of depression; no evidence of hearing loss.  Discussed advanced directives and living will; also discussed end of life issues including code status.  -hx of prostate cancer in 2007; followed by Dr. Rosana Hoes annually. -history of squamous cell carcinoma scalp: recommend annual evaluation by dermatology. -decreased appetite/weight loss:  New.  Obtain labs. No associated symptoms; if persistent, will warrant further work up.   -dizziness: intermittent; s/p neurology consultation and MRI brain.  Orders Placed This Encounter  Procedures  . CBC with Differential/Platelet  . Comprehensive metabolic panel  . Lipid panel    Order Specific Question:   Has the patient fasted?    Answer:   No   No orders of the defined types were placed in this encounter.   Return in about 6  months (around 04/21/2018) for follow-up chronic medical conditions.   Foch Rosenwald Elayne Guerin,  M.D. Primary Care at [facility] [address] [place], [place]  [address] [phone] phone [phone] fax

## 2017-10-20 LAB — CBC WITH DIFFERENTIAL/PLATELET
Basophils Absolute: 0 10*3/uL (ref 0.0–0.2)
Basos: 0 %
EOS (ABSOLUTE): 0.1 10*3/uL (ref 0.0–0.4)
EOS: 1 %
HEMATOCRIT: 44.8 % (ref 37.5–51.0)
HEMOGLOBIN: 14.7 g/dL (ref 13.0–17.7)
IMMATURE GRANULOCYTES: 0 %
Immature Grans (Abs): 0 10*3/uL (ref 0.0–0.1)
Lymphocytes Absolute: 2.4 10*3/uL (ref 0.7–3.1)
Lymphs: 26 %
MCH: 31.9 pg (ref 26.6–33.0)
MCHC: 32.8 g/dL (ref 31.5–35.7)
MCV: 97 fL (ref 79–97)
MONOCYTES: 9 %
Monocytes Absolute: 0.8 10*3/uL (ref 0.1–0.9)
NEUTROS ABS: 5.8 10*3/uL (ref 1.4–7.0)
NEUTROS PCT: 64 %
PLATELETS: 224 10*3/uL (ref 150–450)
RBC: 4.61 x10E6/uL (ref 4.14–5.80)
RDW: 13.9 % (ref 12.3–15.4)
WBC: 9.2 10*3/uL (ref 3.4–10.8)

## 2017-10-20 LAB — LIPID PANEL
CHOL/HDL RATIO: 5.3 ratio — AB (ref 0.0–5.0)
Cholesterol, Total: 206 mg/dL — ABNORMAL HIGH (ref 100–199)
HDL: 39 mg/dL — ABNORMAL LOW (ref >39)
LDL Calculated: 130 mg/dL — ABNORMAL HIGH (ref 0–99)
Triglycerides: 185 mg/dL — ABNORMAL HIGH (ref 0–149)
VLDL CHOLESTEROL CAL: 37 mg/dL (ref 5–40)

## 2017-10-20 LAB — COMPREHENSIVE METABOLIC PANEL
A/G RATIO: 1.8 (ref 1.2–2.2)
ALBUMIN: 4.4 g/dL (ref 3.5–4.8)
ALT: 14 IU/L (ref 0–44)
AST: 23 IU/L (ref 0–40)
Alkaline Phosphatase: 94 IU/L (ref 39–117)
BILIRUBIN TOTAL: 0.7 mg/dL (ref 0.0–1.2)
BUN / CREAT RATIO: 9 — AB (ref 10–24)
BUN: 8 mg/dL (ref 8–27)
CALCIUM: 9.3 mg/dL (ref 8.6–10.2)
CO2: 22 mmol/L (ref 20–29)
Chloride: 99 mmol/L (ref 96–106)
Creatinine, Ser: 0.91 mg/dL (ref 0.76–1.27)
GFR, EST AFRICAN AMERICAN: 92 mL/min/{1.73_m2} (ref >59)
GFR, EST NON AFRICAN AMERICAN: 80 mL/min/{1.73_m2} (ref >59)
Globulin, Total: 2.5 g/dL (ref 1.5–4.5)
Glucose: 103 mg/dL — ABNORMAL HIGH (ref 65–99)
POTASSIUM: 4.6 mmol/L (ref 3.5–5.2)
Sodium: 137 mmol/L (ref 134–144)
TOTAL PROTEIN: 6.9 g/dL (ref 6.0–8.5)

## 2017-10-24 ENCOUNTER — Encounter: Payer: Self-pay | Admitting: Family Medicine

## 2017-11-01 ENCOUNTER — Telehealth: Payer: Self-pay | Admitting: Family Medicine

## 2017-11-01 NOTE — Telephone Encounter (Signed)
Please advise on labs.

## 2017-11-01 NOTE — Telephone Encounter (Signed)
Copied from [place] [phone]. Topic: Quick Communication - See Telephone Encounter >> Nov 01, 2017  9:52 AM Rutherford Nail, NT wrote: CRM for notification. See Telephone encounter for: 11/01/17. Patient is requesting his lab results from his visit on 10/19/17 be mailed to him. Please advise.

## 2017-11-03 ENCOUNTER — Encounter: Payer: Self-pay | Admitting: Family Medicine

## 2017-11-03 ENCOUNTER — Ambulatory Visit (INDEPENDENT_AMBULATORY_CARE_PROVIDER_SITE_OTHER): Payer: Medicare Other | Admitting: Family Medicine

## 2017-11-03 VITALS — BP 160/70 | HR 82 | Temp 98.4°F | Resp 16 | Ht 70.0 in | Wt 172.0 lb

## 2017-11-03 DIAGNOSIS — R030 Elevated blood-pressure reading, without diagnosis of hypertension: Secondary | ICD-10-CM | POA: Diagnosis not present

## 2017-11-03 DIAGNOSIS — R739 Hyperglycemia, unspecified: Secondary | ICD-10-CM | POA: Diagnosis not present

## 2017-11-03 DIAGNOSIS — E7800 Pure hypercholesterolemia, unspecified: Secondary | ICD-10-CM

## 2017-11-03 DIAGNOSIS — H6983 Other specified disorders of Eustachian tube, bilateral: Secondary | ICD-10-CM

## 2017-11-03 DIAGNOSIS — Z8546 Personal history of malignant neoplasm of prostate: Secondary | ICD-10-CM

## 2017-11-03 DIAGNOSIS — J301 Allergic rhinitis due to pollen: Secondary | ICD-10-CM | POA: Diagnosis not present

## 2017-11-03 NOTE — Patient Instructions (Addendum)
  Delia Chimes, MD   IF you received an x-ray today, you will receive an invoice from [facility]. Please contact [facility] at [phone] with questions or concerns regarding your invoice.   IF you received labwork today, you will receive an invoice from [facility]. Please contact [facility] at [phone] with questions or concerns regarding your invoice.   Our billing staff will not be able to assist you with questions regarding bills from these companies.  You will be contacted with the lab results as soon as they are available. The fastest way to get your results is to activate your My Chart account. Instructions are located on the last page of this paperwork. If you have not heard from [place] regarding the results in 2 weeks, please contact this office.

## 2017-11-03 NOTE — Progress Notes (Signed)
Subjective:    Patient ID: Guy Hutchinson, male    DOB: 12/19/1935, 82 y.o.   MRN: [id number]  11/03/2017  Ear Problem (bilateral popping noise ) and Results (wants U/A and go over his labs )    HPI This 82 y.o. male presents for evaluation of ear popping and prostate cancer follow-up.  Also wants to discuss lab results from recent visit.  Dr. Rosana Hoes urology once per year for history of prostate cancer in 2007.  Needs urine checked. Released two years ago from Dr. Rosana Hoes.  2007.  Asymptomatic.  Denies dysuria, urgency, frequency, hematuria, hesitancy.  Denies worsening nocturia.    Ears popping: chronic intermittent issues for patient.  Denies nasal congestion, rhinorrhea, sore throat, hearing loss, tinnitus change.  Denies fever.  Elevated blood pressure; intermittent; white coat syndrome per patient.    BP Readings from Last 3 Encounters:  12/03/17 138/72  11/03/17 (!) 160/70  10/19/17 130/68   Wt Readings from Last 3 Encounters:  12/03/17 164 lb 6.4 oz (74.6 kg)  11/03/17 172 lb (78 kg)  10/19/17 167 lb (75.8 kg)   Immunization History  Administered Date(s) Administered  . Influenza-Unspecified 02/26/2015  . Tdap 05/27/2012    Review of Systems  Constitutional: Negative for activity change, appetite change, chills, diaphoresis, fatigue, fever and unexpected weight change.  HENT: Negative for congestion, dental problem, drooling, ear discharge, ear pain, facial swelling, hearing loss, mouth sores, nosebleeds, postnasal drip, rhinorrhea, sinus pressure, sinus pain, sneezing, sore throat, tinnitus, trouble swallowing and voice change.   Eyes: Negative for photophobia, pain, discharge, redness, itching and visual disturbance.  Respiratory: Negative for apnea, cough, choking, chest tightness, shortness of breath, wheezing and stridor.   Cardiovascular: Negative for chest pain, palpitations and leg swelling.  Gastrointestinal: Negative for abdominal pain, blood in stool,  constipation, diarrhea, nausea and vomiting.  Endocrine: Negative for cold intolerance, heat intolerance, polydipsia, polyphagia and polyuria.  Genitourinary: Negative for decreased urine volume, difficulty urinating, discharge, dysuria, enuresis, flank pain, frequency, genital sores, hematuria, penile pain, penile swelling, scrotal swelling, testicular pain and urgency.  Musculoskeletal: Negative for arthralgias, back pain, gait problem, joint swelling, myalgias, neck pain and neck stiffness.  Skin: Negative for color change, pallor, rash and wound.  Allergic/Immunologic: Negative for environmental allergies, food allergies and immunocompromised state.  Neurological: Negative for dizziness, tremors, seizures, syncope, facial asymmetry, speech difficulty, weakness, light-headedness, numbness and headaches.  Hematological: Negative for adenopathy. Does not bruise/bleed easily.  Psychiatric/Behavioral: Negative for agitation, behavioral problems, confusion, decreased concentration, dysphoric mood, hallucinations, self-injury, sleep disturbance and suicidal ideas. The patient is not nervous/anxious and is not hyperactive.     Past Medical History:  Diagnosis Date  . Cataract   . Dizziness 04/11/2017  . Glaucoma   . Prostate cancer [facility])    Past Surgical History:  Procedure Laterality Date  . PROSTATE SURGERY  2007   Impant   No Known Allergies Current Outpatient Medications on File Prior to Visit  Medication Sig Dispense Refill  . ALFALFA PO Take 1 capsule by mouth every morning.    Marland Kitchen ALPRAZolam (XANAX) 0.5 MG tablet Take 2 tablets approximately 45 minutes prior to the MRI study, take a third tablet if needed. 3 tablet 0  . brimonidine-timolol (COMBIGAN) 0.2-0.5 % ophthalmic solution Place 1 drop into both eyes every 12 (twelve) hours.    Marland Kitchen DANDELION PO Take 1 capsule by mouth every morning.    Marland Kitchen GARLIC PO Take 1 capsule by mouth 3 (three) times daily before  meals.    . latanoprost  (XALATAN) 0.005 % ophthalmic solution INT 2 GTS IN THE AFFECTED EYE QHS  5  . MILK THISTLE PO Take 1 capsule by mouth every morning.    . Multiple Vitamin (MULTIVITAMIN WITH MINERALS) TABS Take 1 tablet by mouth daily.    Marland Kitchen OLIVE LEAF PO Take 400 mg every other day by mouth.    Marland Kitchen OVER THE COUNTER MEDICATION Take 1 capsule by mouth every morning. OTC supplement Pyginal (antioxidant)    . OVER THE COUNTER MEDICATION Take 1 capsule by mouth every morning. OTC acai supplement    . OVER THE COUNTER MEDICATION Take 1 capsule by mouth every morning. OTC tumeric supplement    . VALERIAN PO Take 350 mg at bedtime by mouth.     No current facility-administered medications on file prior to visit.    Social History   Socioeconomic History  . Marital status: Divorced    Spouse name: Not on file  . Number of children: 2  . Years of education: 16  . Highest education level: Bachelor's degree (e.g., BA, AB, BS)  Occupational History  . Occupation: Retired  Scientific laboratory technician  . Financial resource strain: Not hard at all  . Food insecurity:    Worry: Never true    Inability: Never true  . Transportation needs:    Medical: No    Non-medical: No  Tobacco Use  . Smoking status: Never Smoker  . Smokeless tobacco: Never Used  Substance and Sexual Activity  . Alcohol use: Yes    Alcohol/week: 8.4 oz    Types: 14 Cans of beer per week  . Drug use: No  . Sexual activity: Yes  Lifestyle  . Physical activity:    Days per week: 2 days    Minutes per session: 60 min  . Stress: Only a little  Relationships  . Social connections:    Talks on phone: Three times a week    Gets together: Twice a week    Attends religious service: More than 4 times per year    Active member of club or organization: No    Attends meetings of clubs or organizations: Never    Relationship status: Divorced  . Intimate partner violence:    Fear of current or ex partner: No    Emotionally abused: No    Physically abused: No      Forced sexual activity: No  Other Topics Concern  . Not on file  Social History Narrative   Marital status; Divorced.  Dating same male x 2000.      Children:      Lives:  Alone in 2019.      Tobacco: none; in 2019; quit in 1978.      Alcohol:  Drinks 2-3 miller lights per day.        Exercise:  Plays golf two days per week; walks around in malls.      ADLs; drives; performs ADLs; no assistant devices.      Advanced Directives:  None in 2019.  HCPOA:  Son/[employer].  FULL CODE; no prolonged measures.     Right handed    Family History  Problem Relation Age of Onset  . Diabetes Mother   . Heart disease Brother        Objective:    BP (!) 160/70 (BP Location: Right Arm, Patient Position: Sitting, Cuff Size: Normal)   Pulse 82   Temp 98.4 F (36.9 C) (Oral)  Resp 16   Ht 5\' 10"  (1.778 m)   Wt 172 lb (78 kg)   SpO2 99%   BMI 24.68 kg/m  Physical Exam  Constitutional: He is oriented to person, place, and time. He appears well-developed and well-nourished. No distress.  HENT:  Head: Normocephalic and atraumatic.  Right Ear: Tympanic membrane, external ear and ear canal normal.  Left Ear: Tympanic membrane, external ear and ear canal normal.  Nose: Nose normal.  Mouth/Throat: Uvula is midline, oropharynx is clear and moist and mucous membranes are normal. No oropharyngeal exudate.  Eyes: Pupils are equal, round, and reactive to light. Conjunctivae and EOM are normal.  Neck: Normal range of motion. Neck supple. Carotid bruit is not present. No thyromegaly present.  Cardiovascular: Normal rate, regular rhythm, normal heart sounds and intact distal pulses. Exam reveals no gallop and no friction rub.  No murmur heard. Pulmonary/Chest: Effort normal and breath sounds normal. He has no wheezes. He has no rales.  Abdominal: Soft. Bowel sounds are normal. He exhibits no distension and no mass. There is no tenderness. There is no rebound and no guarding.   Lymphadenopathy:    He has no cervical adenopathy.  Neurological: He is alert and oriented to person, place, and time. No cranial nerve deficit.  Skin: Skin is warm and dry. No rash noted. He is not diaphoretic.  Psychiatric: He has a normal mood and affect. His behavior is normal.  Nursing note and vitals reviewed.  No results found. Depression screen [facility] 2/9 12/03/2017 11/03/2017 10/19/2017 04/09/2017 02/24/2017  Decreased Interest 0 0 0 0 0  Down, Depressed, Hopeless 0 0 0 0 0  PHQ - 2 Score 0 0 0 0 0   Fall Risk  12/03/2017 11/03/2017 10/19/2017 04/09/2017 02/24/2017  Falls in the past year? No No No Yes Yes  Number falls in past yr: - - - 1 2 or more  Injury with Fall? - - - Yes No  Comment - - - Patient sprained his hamstring -  Risk for fall due to : - - - Other (Comment) -  Risk for fall due to: Comment - - - Patient tripped over something -  Follow up - - - Falls prevention discussed -    Results for orders placed or performed in visit on 11/03/17  Urinalysis, dipstick only  Result Value Ref Range   Specific Gravity, UA 1.011 1.005 - 1.030   pH, UA 7.0 5.0 - 7.5   Color, UA Yellow Yellow   Appearance Ur Clear Clear   Leukocytes, UA 3+ (A) Negative   Protein, UA Negative Negative/Trace   Glucose, UA Negative Negative   Ketones, UA Negative Negative   RBC, UA Negative Negative   Bilirubin, UA Negative Negative   Urobilinogen, Ur 0.2 0.2 - 1.0 mg/dL   Nitrite, UA Negative Negative       Assessment & Plan:   1. Pure hypercholesterolemia   2. Hyperglycemia   3. Blood pressure elevated without history of HTN   4. Seasonal allergic rhinitis due to pollen   5. History of prostate cancer   6. Dysfunction of both eustachian tubes     Hypercholesterolemia: uncontrolled; patient refuses statin therapy.  I recommend weight loss, exercise, and low-cholesterol low-fat food choices.  I recommend limiting red meat to once per week; I recommend limiting fried foods to once per  month.  Hyperglycemia: New.   I recommend weight loss, exercise, and low-carbohydrate low-sugar food choices. You should AVOID: regular sodas, sweetened  tea, fruit juices.  You should LIMIT: breads, pastas, rice, potatoes, and desserts/sweets.  I would recommend limiting your total carbohydrate intake per meal to 45 grams; I would limit your total carbohydrate intake per snack to 30 grams.  I would also have a goal of 60 grams of protein intake per day; this would equal 10-15 grams of protein per meal and 5-10 grams of protein per snack.  Blood pressure elevated:  New.  Patient to monitor closely at home and bring in home readings.    Eustachian tube dysfunction/allergic rhinitis: stable; benign ear exams in office.  Recommend trial of Flonase nasal spray.  History of prostate cancer: stable; diagnosis in 2007; medically cleared by urology two years ago; obtain labs.   Orders Placed This Encounter  Procedures  . Urinalysis, dipstick only   No orders of the defined types were placed in this encounter.   No follow-ups on file.   Darielys Giglia Elayne Guerin, M.D. Primary Care at [facility] previously Urgent [place] [address] [place], [place]  [phone] [phone] phone [phone] fax

## 2017-11-03 NOTE — Progress Notes (Deleted)
Subjective:    Patient ID: Guy Hutchinson, male    DOB: [birth year], 82 y.o.   MRN: [id number]  11/03/2017  No chief complaint on file.    HPI This 82 y.o. male presents for evaluation of ***. BP Readings from Last 3 Encounters:  11/03/17 (!) 177/93  10/19/17 130/68  04/11/17 (!) 158/78   Wt Readings from Last 3 Encounters:  11/03/17 172 lb (78 kg)  10/19/17 167 lb (75.8 kg)  04/11/17 175 lb (79.4 kg)   Immunization History  Administered Date(s) Administered  . Influenza-Unspecified 02/26/2015  . Tdap 05/27/2012    Review of Systems  Past Medical History:  Diagnosis Date  . Cataract   . Dizziness 04/11/2017  . Glaucoma   . Prostate cancer [facility])    Past Surgical History:  Procedure Laterality Date  . PROSTATE SURGERY  2007   Impant   No Known Allergies Current Outpatient Medications on File Prior to Visit  Medication Sig Dispense Refill  . ALFALFA PO Take 1 capsule by mouth every morning.    Marland Kitchen ALPRAZolam (XANAX) 0.5 MG tablet Take 2 tablets approximately 45 minutes prior to the MRI study, take a third tablet if needed. 3 tablet 0  . brimonidine-timolol (COMBIGAN) 0.2-0.5 % ophthalmic solution Place 1 drop into both eyes every 12 (twelve) hours.    Marland Kitchen DANDELION PO Take 1 capsule by mouth every morning.    Marland Kitchen GARLIC PO Take 1 capsule by mouth 3 (three) times daily before meals.    . latanoprost (XALATAN) 0.005 % ophthalmic solution INT 2 GTS IN THE AFFECTED EYE QHS  5  . MILK THISTLE PO Take 1 capsule by mouth every morning.    . Multiple Vitamin (MULTIVITAMIN WITH MINERALS) TABS Take 1 tablet by mouth daily.    Marland Kitchen OLIVE LEAF PO Take 400 mg every other day by mouth.    Marland Kitchen OVER THE COUNTER MEDICATION Take 1 capsule by mouth every morning. OTC supplement Pyginal (antioxidant)    . OVER THE COUNTER MEDICATION Take 1 capsule by mouth every morning. OTC acai supplement    . OVER THE COUNTER MEDICATION Take 1 capsule by mouth every morning. OTC tumeric supplement    .  VALERIAN PO Take 350 mg at bedtime by mouth.     No current facility-administered medications on file prior to visit.    Social History   Socioeconomic History  . Marital status: Divorced    Spouse name: Not on file  . Number of children: 2  . Years of education: 16  . Highest education level: Bachelor's degree (e.g., BA, AB, BS)  Occupational History  . Occupation: Retired  Scientific laboratory technician  . Financial resource strain: Not hard at all  . Food insecurity:    Worry: Never true    Inability: Never true  . Transportation needs:    Medical: No    Non-medical: No  Tobacco Use  . Smoking status: Never Smoker  . Smokeless tobacco: Never Used  Substance and Sexual Activity  . Alcohol use: Yes    Alcohol/week: 8.4 oz    Types: 14 Cans of beer per week  . Drug use: No  . Sexual activity: Yes  Lifestyle  . Physical activity:    Days per week: 2 days    Minutes per session: 60 min  . Stress: Only a little  Relationships  . Social connections:    Talks on phone: Three times a week    Gets together: Twice a week  Attends religious service: More than 4 times per year    Active member of club or organization: No    Attends meetings of clubs or organizations: Never    Relationship status: Divorced  . Intimate partner violence:    Fear of current or ex partner: No    Emotionally abused: No    Physically abused: No    Forced sexual activity: No  Other Topics Concern  . Not on file  Social History Narrative   Marital status; Divorced.  Dating same male x 2000.      Children:      Lives:  Alone in 2019.      Tobacco: none; in 2019; quit in 1978.      Alcohol:  Drinks 2-3 miller lights per day.        Exercise:  Plays golf two days per week; walks around in malls.      ADLs; drives; performs ADLs; no assistant devices.      Advanced Directives:  None in 2019.  HCPOA:  Son/[employer].  FULL CODE; no prolonged measures.     Right handed    Family History  Problem  Relation Age of Onset  . Diabetes Mother   . Heart disease Brother        Objective:    There were no vitals taken for this visit. Physical Exam No results found. Depression screen [facility] 2/9 11/03/2017 10/19/2017 04/09/2017 02/24/2017 02/07/2017  Decreased Interest 0 0 0 0 0  Down, Depressed, Hopeless 0 0 0 0 0  PHQ - 2 Score 0 0 0 0 0   Fall Risk  11/03/2017 10/19/2017 04/09/2017 02/24/2017 02/07/2017  Falls in the past year? No No Yes Yes No  Number falls in past yr: - - 1 2 or more -  Injury with Fall? - - Yes No -  Comment - - Patient sprained his hamstring - -  Risk for fall due to : - - Other (Comment) - -  Risk for fall due to: Comment - - Patient tripped over something - -  Follow up - - Falls prevention discussed - -        Assessment & Plan:  No diagnosis found.  ***  No orders of the defined types were placed in this encounter.  No orders of the defined types were placed in this encounter.   No follow-ups on file.   Kristi Elayne Guerin, M.D. Primary Care at [facility] previously Urgent [place] [address] [place], [place]  [address] [phone] phone [phone] fax

## 2017-11-04 LAB — URINALYSIS, DIPSTICK ONLY
BILIRUBIN UA: NEGATIVE
GLUCOSE, UA: NEGATIVE
Ketones, UA: NEGATIVE
Nitrite, UA: NEGATIVE
Protein, UA: NEGATIVE
RBC UA: NEGATIVE
SPEC GRAV UA: 1.011 (ref 1.005–1.030)
UUROB: 0.2 mg/dL (ref 0.2–1.0)
pH, UA: 7 (ref 5.0–7.5)

## 2017-11-04 NOTE — Telephone Encounter (Signed)
Patient presented to the office on 11-03-17 for office visit.  Lab results reviewed in detail during that visit.  No further action warranted.

## 2017-11-16 ENCOUNTER — Ambulatory Visit (INDEPENDENT_AMBULATORY_CARE_PROVIDER_SITE_OTHER): Payer: Medicare Other | Admitting: Family Medicine

## 2017-11-16 DIAGNOSIS — C61 Malignant neoplasm of prostate: Secondary | ICD-10-CM

## 2017-11-16 LAB — POCT URINALYSIS DIP (MANUAL ENTRY)
Bilirubin, UA: NEGATIVE
Blood, UA: NEGATIVE
Glucose, UA: NEGATIVE mg/dL
Ketones, POC UA: NEGATIVE mg/dL
Nitrite, UA: NEGATIVE
Protein Ur, POC: NEGATIVE mg/dL
Spec Grav, UA: <=1.005 — AB (ref 1.010–1.025)
Urobilinogen, UA: 0.2 E.U./dL
pH, UA: 6 (ref 5.0–8.0)

## 2017-11-17 NOTE — Patient Instructions (Signed)
Labs

## 2017-11-17 NOTE — Progress Notes (Signed)
Repeat Labs

## 2017-11-21 LAB — URINE CULTURE

## 2017-11-21 MED ORDER — CIPROFLOXACIN HCL 500 MG PO TABS: 500.0000 mg | ORAL_TABLET | Freq: Two times a day (BID) | ORAL | 0 refills | Status: DC

## 2017-11-27 ENCOUNTER — Telehealth: Payer: Self-pay | Admitting: Family Medicine

## 2017-11-27 NOTE — Telephone Encounter (Signed)
Pt dropped in and had finished up his Cipro.and wanted to see if someone could call and let him know if Doctor would order another lab visit for urine follow up. Said he fells better ,but just for his peace of mind.. Did not want to schedule appt. FR

## 2017-11-28 ENCOUNTER — Telehealth: Payer: Self-pay | Admitting: *Deleted

## 2017-11-28 NOTE — Telephone Encounter (Signed)
Message read to patient. States he has been taking the antibiotic and "Symptoms are almost gone."

## 2017-12-03 ENCOUNTER — Encounter: Payer: Self-pay | Admitting: Family Medicine

## 2017-12-03 ENCOUNTER — Other Ambulatory Visit: Payer: Self-pay

## 2017-12-03 ENCOUNTER — Ambulatory Visit (INDEPENDENT_AMBULATORY_CARE_PROVIDER_SITE_OTHER): Payer: Medicare Other | Admitting: Family Medicine

## 2017-12-03 VITALS — BP 138/72 | HR 83 | Temp 98.2°F | Ht 70.0 in | Wt 164.4 lb

## 2017-12-03 DIAGNOSIS — Z87440 Personal history of urinary (tract) infections: Secondary | ICD-10-CM

## 2017-12-03 LAB — POCT URINALYSIS DIP (MANUAL ENTRY)
Bilirubin, UA: NEGATIVE
Blood, UA: NEGATIVE
Glucose, UA: NEGATIVE mg/dL
Ketones, POC UA: NEGATIVE mg/dL
Leukocytes, UA: NEGATIVE
Nitrite, UA: NEGATIVE
Protein Ur, POC: NEGATIVE mg/dL
Spec Grav, UA: 1.015 (ref 1.010–1.025)
Urobilinogen, UA: 0.2 E.U./dL
pH, UA: 7 (ref 5.0–8.0)

## 2017-12-03 NOTE — Progress Notes (Signed)
7/8/20193:09 PM  Guy Hutchinson 31-Oct-1935, 82 y.o. male [id number]  Chief Complaint  Patient presents with  . Urinary Tract Infection    completed abx, doing well    HPI:   Patient is a 82 y.o. male with past medical history significant for prostate cancer s/p radioactive seed implants who presents today for followup on recent treatment of UTI, urine cx positive  He has completed abx He reports clear urine, frequency/urgency back to baseline Denies any dysuria or hematuria Sees urology next week for routine surveillance Denies any fever, chills, nausea or malaise He is doing well, denies any acute concerns  Fall Risk  12/03/2017 11/03/2017 10/19/2017 04/09/2017 02/24/2017  Falls in the past year? No No No Yes Yes  Number falls in past yr: - - - 1 2 or more  Injury with Fall? - - - Yes No  Comment - - - Patient sprained his hamstring -  Risk for fall due to : - - - Other (Comment) -  Risk for fall due to: Comment - - - Patient tripped over something -  Follow up - - - Falls prevention discussed -     Depression screen [facility] 2/9 12/03/2017 11/03/2017 10/19/2017  Decreased Interest 0 0 0  Down, Depressed, Hopeless 0 0 0  PHQ - 2 Score 0 0 0    No Known Allergies  Prior to Admission medications   Medication Sig Start Date End Date Taking? Authorizing Provider  ALFALFA PO Take 1 capsule by mouth every morning.    [provider]  ALPRAZolam Duanne Moron) 0.5 MG tablet Take 2 tablets approximately 45 minutes prior to the MRI study, take a third tablet if needed. 04/25/17   Kathrynn Ducking, MD  brimonidine-timolol (COMBIGAN) 0.2-0.5 % ophthalmic solution Place 1 drop into both eyes every 12 (twelve) hours.    [provider]  ciprofloxacin (CIPRO) 500 MG tablet Take 1 tablet (500 mg total) by mouth 2 (two) times daily. 11/21/17   Rutherford Guys, MD  DANDELION PO Take 1 capsule by mouth every morning.    [provider]  GARLIC PO Take 1 capsule by mouth 3  (three) times daily before meals.    [provider]  latanoprost (XALATAN) 0.005 % ophthalmic solution INT 2 GTS IN THE AFFECTED EYE QHS 10/25/17   [provider]  MILK THISTLE PO Take 1 capsule by mouth every morning.    [provider]  Multiple Vitamin (MULTIVITAMIN WITH MINERALS) TABS Take 1 tablet by mouth daily.    [provider]  OLIVE LEAF PO Take 400 mg every other day by mouth.    [provider]  OVER THE COUNTER MEDICATION Take 1 capsule by mouth every morning. OTC supplement Pyginal (antioxidant)    [provider]  OVER THE COUNTER MEDICATION Take 1 capsule by mouth every morning. OTC acai supplement    [provider]  OVER THE COUNTER MEDICATION Take 1 capsule by mouth every morning. OTC tumeric supplement    [provider]  VALERIAN PO Take 350 mg at bedtime by mouth.    [provider]    Past Medical History:  Diagnosis Date  . Cataract   . Dizziness 04/11/2017  . Glaucoma   . Prostate cancer [facility])     Past Surgical History:  Procedure Laterality Date  . PROSTATE SURGERY  2007   Impant    Social History   Tobacco Use  . Smoking status: Never Smoker  .  Smokeless tobacco: Never Used  Substance Use Topics  . Alcohol use: Yes    Alcohol/week: 8.4 oz    Types: 14 Cans of beer per week    Family History  Problem Relation Age of Onset  . Diabetes Mother   . Heart disease Brother     ROS Per hpi  OBJECTIVE:  Blood pressure 138/72, pulse 83, temperature 98.2 F (36.8 C), temperature source Oral, height 5\' 10"  (1.778 m), weight 164 lb 6.4 oz (74.6 kg), SpO2 97 %.  Physical Exam  Constitutional: He is oriented to person, place, and time. He appears well-developed and well-nourished.  HENT:  Head: Normocephalic and atraumatic.  Mouth/Throat: Oropharynx is clear and moist.  Eyes: Pupils are equal, round, and reactive to light. EOM are normal.  Neck: Neck supple.    Pulmonary/Chest: Effort normal.  Neurological: He is alert and oriented to person, place, and time.  Skin: Skin is warm and dry.  Psychiatric: He has a normal mood and affect.  Nursing note and vitals reviewed.     Results for orders placed or performed in visit on 12/03/17 (from the past 24 hour(s))  POCT urinalysis dipstick     Status: Normal   Collection Time: 12/03/17  3:09 PM  Result Value Ref Range   Color, UA yellow yellow   Clarity, UA clear clear   Glucose, UA negative negative mg/dL   Bilirubin, UA negative negative   Ketones, POC UA negative negative mg/dL   Spec Grav, UA 1.015 1.010 - 1.025   Blood, UA negative negative   pH, UA 7.0 5.0 - 8.0   Protein Ur, POC negative negative mg/dL   Urobilinogen, UA 0.2 0.2 or 1.0 E.U./dL   Nitrite, UA Negative Negative   Leukocytes, UA Negative Negative      ASSESSMENT and PLAN  1. History of recurrent UTIs UA today normal. Recent UTI resolved. Follow-up with urology as scheduled.   - POCT urinalysis dipstick  Return if symptoms worsen or fail to improve.    Rutherford Guys, MD Primary Care at [place], [place] [address] Ph.  [phone] Fax [phone]

## 2017-12-03 NOTE — Patient Instructions (Signed)
     IF you received an x-ray today, you will receive an invoice from [facility]. Please contact [facility] at [phone] with questions or concerns regarding your invoice.   IF you received labwork today, you will receive an invoice from [facility]. Please contact [facility] at [phone] with questions or concerns regarding your invoice.   Our billing staff will not be able to assist you with questions regarding bills from these companies.  You will be contacted with the lab results as soon as they are available. The fastest way to get your results is to activate your My Chart account. Instructions are located on the last page of this paperwork. If you have not heard from us regarding the results in 2 weeks, please contact this office.     

## 2018-01-25 DIAGNOSIS — H401132 Primary open-angle glaucoma, bilateral, moderate stage: Secondary | ICD-10-CM | POA: Diagnosis not present

## 2018-02-06 ENCOUNTER — Other Ambulatory Visit: Payer: Self-pay

## 2018-02-06 ENCOUNTER — Encounter: Payer: Self-pay | Admitting: Physician Assistant

## 2018-02-06 ENCOUNTER — Ambulatory Visit (INDEPENDENT_AMBULATORY_CARE_PROVIDER_SITE_OTHER): Payer: Medicare Other | Admitting: Physician Assistant

## 2018-02-06 VITALS — BP 160/80 | HR 84 | Temp 98.6°F | Resp 16 | Ht 67.5 in | Wt 170.0 lb

## 2018-02-06 DIAGNOSIS — H9311 Tinnitus, right ear: Secondary | ICD-10-CM | POA: Diagnosis not present

## 2018-02-06 DIAGNOSIS — Z87898 Personal history of other specified conditions: Secondary | ICD-10-CM

## 2018-02-06 DIAGNOSIS — R030 Elevated blood-pressure reading, without diagnosis of hypertension: Secondary | ICD-10-CM | POA: Diagnosis not present

## 2018-02-06 NOTE — Patient Instructions (Addendum)
   You will receive a phone call to schedule an appointment with an ear doctor.   Thank you for coming in today. I hope you feel we met your needs.  Feel free to call PCP if you have any questions or further requests.  Please consider signing up for MyChart if you do not already have it, as this is a great way to communicate with me.  Best,  Whitney McVey, PA-C   IF you received an x-ray today, you will receive an invoice from [facility]. Please contact [facility] at [phone] with questions or concerns regarding your invoice.   IF you received labwork today, you will receive an invoice from [facility]. Please contact [facility] at [phone] with questions or concerns regarding your invoice.   Our billing staff will not be able to assist you with questions regarding bills from these companies.  You will be contacted with the lab results as soon as they are available. The fastest way to get your results is to activate your My Chart account. Instructions are located on the last page of this paperwork. If you have not heard from [place] regarding the results in 2 weeks, please contact this office.

## 2018-02-06 NOTE — Progress Notes (Signed)
Guy Hutchinson  MRN: [id number] DOB: [birth year]  PCP: Patient, No Pcp Per  Subjective:  Pt is an 82 year old male who  has a past medical history of Cataract, Dizziness (04/11/2017), Glaucoma, and Prostate cancer (Guy Hutchinson). and recurrent UTIs presents to clinic for dizziness.   Endorses feeling "woozy" headed. Some days are worse than others. Episodes happen about 75% of the time. Sometimes when standing from seated position. Endorses occasional ringing of his ears, R>L. Checks blood pressures every other day: 136/76, 140/80, 118/62.  Denies syncope, near syncope, visual changes, facial drooping, unsteady gait, decreased hearing, ear pain, sinus pressure, palpitations, diaphoresis, chest pain, nausea, muscle weakness.   Dizziness is a chronic problem for this pt - last documentation with Dr. Tamala Julian 09/2017 at annual exam. Referral to neurology.   Evaluated by neurology Dr. Jannifer Franklin 2018: MRI brain 04/2017 - moderate level of small vessel disease, no acute changes were seen.  The patient is to go on low-dose aspirin.   BP Readings from Last 3 Encounters:  02/06/18 (!) 160/80  12/03/17 138/72  11/03/17 (!) 160/70   Review of Systems  Constitutional: Negative for diaphoresis and fatigue.  HENT: Positive for tinnitus. Negative for ear discharge, ear pain, sinus pressure and sinus pain.   Cardiovascular: Negative for chest pain and palpitations.  Neurological: Positive for dizziness. Negative for syncope, speech difficulty, light-headedness and headaches.    Patient Active Problem List   Diagnosis Date Noted  . Squamous cell carcinoma of scalp 10/19/2017  . Dizziness 04/11/2017  . History of prostate cancer 07/21/2016  . Hearing impaired 10/22/2014    Current Outpatient Medications on File Prior to Visit  Medication Sig Dispense Refill  . ALFALFA PO Take 1 capsule by mouth every morning.    Marland Kitchen ALPRAZolam (XANAX) 0.5 MG tablet Take 2 tablets approximately 45 minutes prior to the MRI  study, take a third tablet if needed. 3 tablet 0  . brimonidine-timolol (COMBIGAN) 0.2-0.5 % ophthalmic solution Place 1 drop into both eyes every 12 (twelve) hours.    Marland Kitchen DANDELION PO Take 1 capsule by mouth every morning.    Marland Kitchen GARLIC PO Take 1 capsule by mouth 3 (three) times daily before meals.    . latanoprost (XALATAN) 0.005 % ophthalmic solution INT 2 GTS IN THE AFFECTED EYE QHS  5  . MILK THISTLE PO Take 1 capsule by mouth every morning.    . Multiple Vitamin (MULTIVITAMIN WITH MINERALS) TABS Take 1 tablet by mouth daily.    Marland Kitchen OLIVE LEAF PO Take 400 mg every other day by mouth.    Marland Kitchen OVER THE COUNTER MEDICATION Take 1 capsule by mouth every morning. OTC supplement Pyginal (antioxidant)    . OVER THE COUNTER MEDICATION Take 1 capsule by mouth every morning. OTC acai supplement    . OVER THE COUNTER MEDICATION Take 1 capsule by mouth every morning. OTC tumeric supplement    . VALERIAN PO Take 350 mg at bedtime by mouth.     No current facility-administered medications on file prior to visit.     No Known Allergies   Objective:  BP (!) 160/80 (BP Location: Left Arm, Patient Position: Sitting, Cuff Size: Normal)   Pulse 84   Temp 98.6 F (37 C) (Oral)   Resp 16   Ht 5' 7.5" (1.715 m)   Wt 170 lb (77.1 kg)   SpO2 96%   BMI 26.23 kg/m  Orthostatic VS for the past 24 hrs:  BP- Lying Pulse- Lying BP-  Sitting Pulse- Sitting BP- Standing at 0 minutes Pulse- Standing at 0 minutes  02/06/18 1041 (!) 175/92 71 (!) 171/91 74 171/83 75    Physical Exam  Constitutional: He is oriented to person, place, and time. He appears well-developed and well-nourished.  HENT:  Right Ear: Hearing, external ear and ear canal normal. Tympanic membrane is bulging (mild). Tympanic membrane is not injected and not perforated.  Left Ear: Hearing, tympanic membrane, external ear and ear canal normal. Tympanic membrane is not injected, not perforated and not bulging.  Eyes: Pupils are equal, round, and  reactive to light. EOM are normal.  Cardiovascular: Normal rate and regular rhythm.  Pulmonary/Chest: Effort normal. No respiratory distress.  Neurological: He is alert and oriented to person, place, and time.  Ambulating well, no abnormal gait  Skin: Skin is warm and dry.  Psychiatric: He has a normal mood and affect. His behavior is normal. Judgment and thought content normal.  Vitals reviewed.  Lab Results  Component Value Date   WBC 9.2 10/19/2017   HGB 14.7 10/19/2017   HCT 44.8 10/19/2017   MCV 97 10/19/2017   PLT 224 10/19/2017   Lab Results  Component Value Date   HGBA1C 5.6 11/17/2016    Assessment and Plan :  1. History of dizziness 2. Tinnitus of right ear - Pt presents for evaluation of chronic dizziness and tinnitus. Evaluated for this problem by neurology Dr. Jannifer Franklin 2018: MRI brain 04/2017 - moderate level of small vessel disease, no acute changes were seen. Symptoms are not worsening. He is not orthostatic. Home blood pressures wnl. Recent labs are negative. Suspect small vessel disease, however will have ENT evaluation for possible inner ear problem.  - Ambulatory referral to ENT  3. Elevated blood pressure reading - orthostatic vital signs - Recheck vitals  Mercer Pod, PA-C  [facility] [place] 02/06/2018 10:10 AM  Please note: Portions of this report may have been transcribed using dragon voice recognition software. Every effort was made to ensure accuracy; however, inadvertent computerized transcription errors may be present.

## 2018-02-12 DIAGNOSIS — H9312 Tinnitus, left ear: Secondary | ICD-10-CM | POA: Diagnosis not present

## 2018-02-12 DIAGNOSIS — R42 Dizziness and giddiness: Secondary | ICD-10-CM | POA: Diagnosis not present

## 2018-02-12 DIAGNOSIS — H9192 Unspecified hearing loss, left ear: Secondary | ICD-10-CM | POA: Diagnosis not present

## 2018-03-12 DIAGNOSIS — H90A22 Sensorineural hearing loss, unilateral, left ear, with restricted hearing on the contralateral side: Secondary | ICD-10-CM | POA: Diagnosis not present

## 2018-03-12 DIAGNOSIS — H9191 Unspecified hearing loss, right ear: Secondary | ICD-10-CM | POA: Diagnosis not present

## 2018-04-24 DIAGNOSIS — H401132 Primary open-angle glaucoma, bilateral, moderate stage: Secondary | ICD-10-CM | POA: Diagnosis not present

## 2018-07-17 DIAGNOSIS — H401132 Primary open-angle glaucoma, bilateral, moderate stage: Secondary | ICD-10-CM | POA: Diagnosis not present

## 2018-11-01 DIAGNOSIS — H401132 Primary open-angle glaucoma, bilateral, moderate stage: Secondary | ICD-10-CM | POA: Diagnosis not present

## 2019-01-27 ENCOUNTER — Ambulatory Visit (INDEPENDENT_AMBULATORY_CARE_PROVIDER_SITE_OTHER): Payer: Medicare Other | Admitting: Family Medicine

## 2019-01-27 ENCOUNTER — Ambulatory Visit (INDEPENDENT_AMBULATORY_CARE_PROVIDER_SITE_OTHER): Payer: Medicare Other

## 2019-01-27 ENCOUNTER — Encounter: Payer: Self-pay | Admitting: Family Medicine

## 2019-01-27 ENCOUNTER — Other Ambulatory Visit: Payer: Self-pay

## 2019-01-27 VITALS — BP 160/74 | HR 83 | Temp 98.3°F | Ht 70.0 in | Wt 156.2 lb

## 2019-01-27 DIAGNOSIS — S8011XA Contusion of right lower leg, initial encounter: Secondary | ICD-10-CM

## 2019-01-27 DIAGNOSIS — S80811A Abrasion, right lower leg, initial encounter: Secondary | ICD-10-CM

## 2019-01-27 DIAGNOSIS — R030 Elevated blood-pressure reading, without diagnosis of hypertension: Secondary | ICD-10-CM

## 2019-01-27 DIAGNOSIS — M25561 Pain in right knee: Secondary | ICD-10-CM

## 2019-01-27 DIAGNOSIS — M1711 Unilateral primary osteoarthritis, right knee: Secondary | ICD-10-CM | POA: Diagnosis not present

## 2019-01-27 NOTE — Progress Notes (Signed)
Subjective:    Patient ID: Guy Hutchinson, male    DOB: 10-05-1935, 83 y.o.   MRN: [id number]  HPI Guy Hutchinson is a 83 y.o. male Presents today for: Chief Complaint  Patient presents with  . right shine Bruse    happened 2 weeks ago    Contusion/bruising of right shin: Changing lightbulb 2 weeks ago, standing up on a chair, R leg slipped and struck wrought iron chair. Had bleeding of area, but pain free to walk on it.  Some stinging at times, but overall better and still able to walk on area ok.  Tx: peroxide, arnica gel. Had been taking 81mg  ASA QD.   Elevated blood pressure. Previous elevated readings in office but had reported normal home readings.  Suspected whitecoat hypertension. Home readings - 140/65, 135/60. - [facility] and [facility].  Tx: garlic and hawthorne berry.  No CP/dyspnea, no new HA or dizziness.   Patient Active Problem List   Diagnosis Date Noted  . Squamous cell carcinoma of scalp 10/19/2017  . Dizziness 04/11/2017  . History of prostate cancer 07/21/2016  . Hearing impaired 10/22/2014   Past Medical History:  Diagnosis Date  . Cataract   . Dizziness 04/11/2017  . Glaucoma   . Prostate cancer [facility])    Past Surgical History:  Procedure Laterality Date  . PROSTATE SURGERY  2007   Impant   No Known Allergies Prior to Admission medications   Medication Sig Start Date End Date Taking? Authorizing Provider  ALFALFA PO Take 1 capsule by mouth every morning.   Yes [provider]  ALPRAZolam Guy Hutchinson) 0.5 MG tablet Take 2 tablets approximately 45 minutes prior to the MRI study, take a third tablet if needed. 04/25/17  Yes Guy Ducking, MD  brimonidine-timolol (COMBIGAN) 0.2-0.5 % ophthalmic solution Place 1 drop into both eyes every 12 (twelve) hours.   Yes [provider]  DANDELION PO Take 1 capsule by mouth every morning.   Yes [provider]  GARLIC PO Take 1 capsule by mouth 3 (three) times daily before  meals.   Yes [provider]  latanoprost (XALATAN) 0.005 % ophthalmic solution INT 2 GTS IN THE AFFECTED EYE QHS 10/25/17  Yes [provider]  MILK THISTLE PO Take 1 capsule by mouth every morning.   Yes [provider]  Multiple Vitamin (MULTIVITAMIN WITH MINERALS) TABS Take 1 tablet by mouth daily.   Yes [provider]  OLIVE LEAF PO Take 400 mg every other day by mouth.   Yes [provider]  OVER THE COUNTER MEDICATION Take 1 capsule by mouth every morning. OTC supplement Pyginal (antioxidant)   Yes [provider]  OVER THE COUNTER MEDICATION Take 1 capsule by mouth every morning. OTC acai supplement   Yes [provider]  OVER THE COUNTER MEDICATION Take 1 capsule by mouth every morning. OTC tumeric supplement   Yes [provider]  VALERIAN PO Take 350 mg at bedtime by mouth.   Yes [provider]   Social History   Socioeconomic History  . Marital status: Divorced    Spouse name: Not on file  . Number of children: 2  . Years of education: 16  . Highest education level: Bachelor's degree (e.g., BA, AB, BS)  Occupational History  . Occupation: Retired  Scientific laboratory technician  . Financial resource strain: Not hard at all  . Food insecurity    Worry: Never true    Inability: Never true  .  Transportation needs    Medical: No    Non-medical: No  Tobacco Use  . Smoking status: Never Smoker  . Smokeless tobacco: Never Used  Substance and Sexual Activity  . Alcohol use: Yes    Alcohol/week: 14.0 standard drinks    Types: 14 Cans of beer per week  . Drug use: No  . Sexual activity: Yes  Lifestyle  . Physical activity    Days per week: 2 days    Minutes per session: 60 min  . Stress: Only a little  Relationships  . Social Herbalist on phone: Three times a week    Gets together: Twice a week    Attends religious service: More than 4 times per year    Active member of club or organization: No     Attends meetings of clubs or organizations: Never    Relationship status: Divorced  . Intimate partner violence    Fear of current or ex partner: No    Emotionally abused: No    Physically abused: No    Forced sexual activity: No  Other Topics Concern  . Not on file  Social History Narrative   Marital status; Divorced.  Dating same male x 2000.      Children:      Lives:  Alone in 2019.      Tobacco: none; in 2019; quit in 1978.      Alcohol:  Drinks 2-3 miller lights per day.        Exercise:  Plays golf two days per week; walks around [place].      ADLs; drives; performs ADLs; no assistant devices.      Advanced Directives:  None in 2019.  HCPOA:  Son/[employer].  FULL CODE; no prolonged measures.     Right handed     Review of Systems Per HPI.     Objective:   Physical Exam Vitals signs reviewed.  Constitutional:      Appearance: He is well-developed.  HENT:     Head: Normocephalic and atraumatic.  Eyes:     Pupils: Pupils are equal, round, and reactive to light.  Neck:     Vascular: No carotid bruit or JVD.  Cardiovascular:     Rate and Rhythm: Normal rate and regular rhythm.     Heart sounds: Normal heart sounds. No murmur.  Pulmonary:     Effort: Pulmonary effort is normal.     Breath sounds: Normal breath sounds. No rales.  Musculoskeletal:     Right lower leg: He exhibits tenderness and swelling. Edema present.       Legs:  Skin:    General: Skin is warm and dry.     Findings: Bruising present.  Neurological:     Mental Status: He is alert and oriented to person, place, and time.    Vitals:   01/27/19 0947 01/27/19 0952  BP: (!) 193/88 (!) 160/74  Pulse: 83   Temp: 98.3 F (36.8 C)   TempSrc: Oral   SpO2: 96%   Weight: 156 lb 3.2 oz (70.9 kg)   Height: 5\' 10"  (1.778 m)     Dg Tibia/fibula Right  Result Date: 01/27/2019 CLINICAL DATA:  Blow to the anterior aspect of the right lower leg 2 weeks ago. Contusion. Initial encounter.  EXAM: RIGHT TIBIA AND FIBULA - 2 VIEW COMPARISON:  Plain films right knee 12/18/2018. FINDINGS: No acute bony or joint abnormality is identified. Previously seen proximal tibia fracture has  healed. Degenerative change about the right knee is noted. There is soft tissue swelling anterior to the proximal to mid tibia. No radiopaque foreign body or soft tissue gas. IMPRESSION: Soft tissue swelling anterior to the proximal to mid tibia consistent with contusion. No acute bony abnormality. Right knee osteoarthritis. Electronically Signed   By: Inge Rise M.D.   On: 01/27/2019 10:52      Assessment & Plan:   BENJMAN FREMONT is a 83 y.o. male Contusion of right lower leg, initial encounter - Plan: DG Tibia/Fibula Right  Elevated blood pressure reading  Abrasion of anterior right lower leg, initial encounter - Plan: DG Tibia/Fibula Right  Arthralgia of right lower leg - Plan: DG Tibia/Fibula Right  Previous contusion with abrasion, persistent hematoma/soft tissue swelling likely without signs of infection.  X-ray reassuring.  Continued symptomatic care with RTC precautions given.  Handout given on abrasions and contusions.  Reported improved home blood pressure readings versus in office.  Asymptomatic.  Home monitoring discussed with RTC precautions if elevated, or if symptomatic.   No orders of the defined types were placed in this encounter.  Patient Instructions   See information below on contusions.  I suspect the swelling will continue to improve with some time.  Watch for any new redness or discharge around the wounds and if that occurs be seen right away.  Okay to gently cleanse over the abrasions with soap and water.  Return to the clinic or go to the nearest emergency room if any of your symptoms worsen or new symptoms occur.  Keep a record of your blood pressures outside of the office and if over 150 on top number or 90 or bottom number - return to discuss other treatments. Please  follow up with me in next 6 months for a physical/wellness exam.    Abrasion  An abrasion is a cut or a scrape on the outer surface of the skin. An abrasion does not go through all the layers of the skin. It is important to care for your abrasion properly to prevent infection. What are the causes? This condition is caused by falling on or gliding across the ground or another surface. When your skin rubs on something, the outer and inner layers of skin may rub off. What are the signs or symptoms? The main symptom of this condition is a cut or a scrape. The scrape may be bleeding, or it may appear red or pink. If the abrasion was caused by a fall, there may be a bruise under the cut or scrape. How is this diagnosed? An abrasion is diagnosed with a physical exam. How is this treated? Treatment for this condition depends on how large and deep the abrasion is. In most cases:  Your abrasion will be cleaned with water and mild soap. This is done to remove any dirt or debris (such as particles of glass or rock) that may be stuck in the wound.  An antibiotic ointment may be applied to the abrasion to help prevent infection.  A bandage (dressing) may be placed on the abrasion to keep it clean. You may also need a tetanus shot. Follow these instructions at home: Medicines  Take or apply over-the-counter and prescription medicines only as told by your health care provider.  If you were prescribed an antibiotic medicine, apply it as told by your health care provider. Wound care  Clean the wound 2-3 times a day, or as directed by your health care provider. To do this,  wash the wound with mild soap and water, rinse off the soap, and pat the wound dry with a clean towel. Do not rub the wound.  Keep the dressing clean and dry as told by your health care provider.  There are many different ways to close and cover a wound. Follow instructions from your health care provider about: ? Caring for your  wound. ? Changing and removing your dressing. You may have to change your dressing one or more times a day, or as directed by your health care provider.  Check your wound every day for signs of infection. Check for: ? Redness, particularly a red streak that spreads out from the wound. ? Swelling or increased pain. ? Warmth. ? Fluid, pus, or a bad smell.  If directed, put ice on the injured area to reduce pain and swelling: ? Put ice in a plastic bag. ? Place a towel between your skin and the bag. ? Leave the ice on for 20 minutes, 2-3 times a day. General instructions  Do not take baths, swim, or use a hot tub until your health care provider says it is okay to do so.  If possible, raise (elevate) the injured area above the level of your heart while you are sitting or lying down. This will reduce pain and swelling.  Keep all follow-up visits as directed by your health care provider. This is important. Contact a health care provider if:  You received a tetanus shot, and you have swelling, severe pain, redness, or bleeding at the injection site.  Your pain is not controlled with medicine.  You have redness, swelling, or more pain at the site of your wound. Get help right away if:  You have a red streak spreading away from your wound.  You have a fever.  You have fluid, blood, or pus coming from your wound.  You notice a bad smell coming from your wound or your dressing. Summary  An abrasion is a cut or a scrape on the outer surface of the skin. An abrasion does not go through all the layers of the skin.  Care for your abrasion properly to prevent infection.  Clean the wound with mild soap and water 2-3 times a day. Follow instructions from your health care provider about taking medicines and changing your bandage (dressing).  Contact your health care provider if you have redness, swelling or more pain in the wound area.  Get help right away if you have a fever or if you  have fluid, blood, pus, a bad smell, or a red streak coming from the wound. This information is not intended to replace advice given to you by your health care provider. Make sure you discuss any questions you have with your health care provider. Document Released: 02/22/2005 Document Revised: 04/27/2017 Document Reviewed: 12/27/2016 Elsevier Patient Education  2020 [place].  Contusion A contusion is a deep bruise. Contusions are the result of a blunt injury to tissues and muscle fibers under the skin. The injury causes bleeding under the skin. The skin overlying the contusion may turn blue, purple, or yellow. Minor injuries will give you a painless contusion, but more severe injuries cause contusions that may stay painful and swollen for a few weeks. Follow these instructions at home: Pay attention to any changes in your symptoms. Let your health care provider know about them. Take these actions to relieve your pain. Managing pain, stiffness, and swelling   Use resting, icing, applying pressure (compression), and raising (  elevating) the injured area. This is often called the RICE strategy. ? Rest the injured area. Return to your normal activities as told by your health care provider. Ask your health care provider what activities are safe for you. ? If directed, put ice on the injured area:  Put ice in a plastic bag.  Place a towel between your skin and the bag.  Leave the ice on for 20 minutes, 2-3 times per day. ? If directed, apply light compression to the injured area using an elastic bandage. Make sure the bandage is not wrapped too tightly. Remove and reapply the bandage as directed by your health care provider. ? If possible, raise (elevate) the injured area above the level of your heart while you are sitting or lying down. General instructions  Take over-the-counter and prescription medicines only as told by your health care provider.  Keep all follow-up visits as told by  your health care provider. This is important. Contact a health care provider if:  Your symptoms do not improve after several days of treatment.  Your symptoms get worse.  You have difficulty moving the injured area. Get help right away if:  You have severe pain.  You have numbness in a hand or foot.  Your hand or foot turns pale or cold. Summary  A contusion is a deep bruise.  Contusions are the result of a blunt injury to tissues and muscle fibers under the skin.  It is treated with rest, ice, compression, and elevation. You may be given over-the-counter medicines for pain.  Contact a health care provider if your symptoms do not improve, or get worse.  Get help right away if you have severe pain, have numbness, or the area turns pale or cold. This information is not intended to replace advice given to you by your health care provider. Make sure you discuss any questions you have with your health care provider. Document Released: 02/22/2005 Document Revised: 01/03/2018 Document Reviewed: 01/03/2018 Elsevier Patient Education  [employer].   If you have lab work done today you will be contacted with your lab results within the next 2 weeks.  If you have not heard from [place] then please contact us. The fastest way to get your results is to register for My Chart.   IF you received an x-ray today, you will receive an invoice from [facility]. Please contact [facility] at [phone] with questions or concerns regarding your invoice.   IF you received labwork today, you will receive an invoice from [place]. Please contact [facility] at [phone] with questions or concerns regarding your invoice.   Our billing staff will not be able to assist you with questions regarding bills from these companies.  You will be contacted with the lab results as soon as they are available. The fastest way to get your results is to activate your My Chart account. Instructions  are located on the last page of this paperwork. If you have not heard from [place] regarding the results in 2 weeks, please contact this office.       Signed,   Merri Ray, MD [facility] [place].  01/28/19 9:08 PM

## 2019-01-27 NOTE — Patient Instructions (Addendum)
See information below on contusions.  I suspect the swelling will continue to improve with some time.  Watch for any new redness or discharge around the wounds and if that occurs be seen right away.  Okay to gently cleanse over the abrasions with soap and water.  Return to the clinic or go to the nearest emergency room if any of your symptoms worsen or new symptoms occur.  Keep a record of your blood pressures outside of the office and if over 150 on top number or 90 or bottom number - return to discuss other treatments. Please follow up with me in next 6 months for a physical/wellness exam.    Abrasion  An abrasion is a cut or a scrape on the outer surface of the skin. An abrasion does not go through all the layers of the skin. It is important to care for your abrasion properly to prevent infection. What are the causes? This condition is caused by falling on or gliding across the ground or another surface. When your skin rubs on something, the outer and inner layers of skin may rub off. What are the signs or symptoms? The main symptom of this condition is a cut or a scrape. The scrape may be bleeding, or it may appear red or pink. If the abrasion was caused by a fall, there may be a bruise under the cut or scrape. How is this diagnosed? An abrasion is diagnosed with a physical exam. How is this treated? Treatment for this condition depends on how large and deep the abrasion is. In most cases:  Your abrasion will be cleaned with water and mild soap. This is done to remove any dirt or debris (such as particles of glass or rock) that may be stuck in the wound.  An antibiotic ointment may be applied to the abrasion to help prevent infection.  A bandage (dressing) may be placed on the abrasion to keep it clean. You may also need a tetanus shot. Follow these instructions at home: Medicines  Take or apply over-the-counter and prescription medicines only as told by your health care provider.  If  you were prescribed an antibiotic medicine, apply it as told by your health care provider. Wound care  Clean the wound 2-3 times a day, or as directed by your health care provider. To do this, wash the wound with mild soap and water, rinse off the soap, and pat the wound dry with a clean towel. Do not rub the wound.  Keep the dressing clean and dry as told by your health care provider.  There are many different ways to close and cover a wound. Follow instructions from your health care provider about: ? Caring for your wound. ? Changing and removing your dressing. You may have to change your dressing one or more times a day, or as directed by your health care provider.  Check your wound every day for signs of infection. Check for: ? Redness, particularly a red streak that spreads out from the wound. ? Swelling or increased pain. ? Warmth. ? Fluid, pus, or a bad smell.  If directed, put ice on the injured area to reduce pain and swelling: ? Put ice in a plastic bag. ? Place a towel between your skin and the bag. ? Leave the ice on for 20 minutes, 2-3 times a day. General instructions  Do not take baths, swim, or use a hot tub until your health care provider says it is okay to do so.  If possible, raise (elevate) the injured area above the level of your heart while you are sitting or lying down. This will reduce pain and swelling.  Keep all follow-up visits as directed by your health care provider. This is important. Contact a health care provider if:  You received a tetanus shot, and you have swelling, severe pain, redness, or bleeding at the injection site.  Your pain is not controlled with medicine.  You have redness, swelling, or more pain at the site of your wound. Get help right away if:  You have a red streak spreading away from your wound.  You have a fever.  You have fluid, blood, or pus coming from your wound.  You notice a bad smell coming from your wound or your  dressing. Summary  An abrasion is a cut or a scrape on the outer surface of the skin. An abrasion does not go through all the layers of the skin.  Care for your abrasion properly to prevent infection.  Clean the wound with mild soap and water 2-3 times a day. Follow instructions from your health care provider about taking medicines and changing your bandage (dressing).  Contact your health care provider if you have redness, swelling or more pain in the wound area.  Get help right away if you have a fever or if you have fluid, blood, pus, a bad smell, or a red streak coming from the wound. This information is not intended to replace advice given to you by your health care provider. Make sure you discuss any questions you have with your health care provider. Document Released: 02/22/2005 Document Revised: 04/27/2017 Document Reviewed: 12/27/2016 Elsevier Patient Education  2020 [employer].  Contusion A contusion is a deep bruise. Contusions are the result of a blunt injury to tissues and muscle fibers under the skin. The injury causes bleeding under the skin. The skin overlying the contusion may turn blue, purple, or yellow. Minor injuries will give you a painless contusion, but more severe injuries cause contusions that may stay painful and swollen for a few weeks. Follow these instructions at home: Pay attention to any changes in your symptoms. Let your health care provider know about them. Take these actions to relieve your pain. Managing pain, stiffness, and swelling   Use resting, icing, applying pressure (compression), and raising (elevating) the injured area. This is often called the RICE strategy. ? Rest the injured area. Return to your normal activities as told by your health care provider. Ask your health care provider what activities are safe for you. ? If directed, put ice on the injured area:  Put ice in a plastic bag.  Place a towel between your skin and the bag.  Leave  the ice on for 20 minutes, 2-3 times per day. ? If directed, apply light compression to the injured area using an elastic bandage. Make sure the bandage is not wrapped too tightly. Remove and reapply the bandage as directed by your health care provider. ? If possible, raise (elevate) the injured area above the level of your heart while you are sitting or lying down. General instructions  Take over-the-counter and prescription medicines only as told by your health care provider.  Keep all follow-up visits as told by your health care provider. This is important. Contact a health care provider if:  Your symptoms do not improve after several days of treatment.  Your symptoms get worse.  You have difficulty moving the injured area. Get help right away if:  You have severe pain.  You have numbness in a hand or foot.  Your hand or foot turns pale or cold. Summary  A contusion is a deep bruise.  Contusions are the result of a blunt injury to tissues and muscle fibers under the skin.  It is treated with rest, ice, compression, and elevation. You may be given over-the-counter medicines for pain.  Contact a health care provider if your symptoms do not improve, or get worse.  Get help right away if you have severe pain, have numbness, or the area turns pale or cold. This information is not intended to replace advice given to you by your health care provider. Make sure you discuss any questions you have with your health care provider. Document Released: 02/22/2005 Document Revised: 01/03/2018 Document Reviewed: 01/03/2018 Elsevier Patient Education  [employer].   If you have lab work done today you will be contacted with your lab results within the next 2 weeks.  If you have not heard from [place] then please contact us. The fastest way to get your results is to register for My Chart.   IF you received an x-ray today, you will receive an invoice from [facility]. Please  contact [facility] at [phone] with questions or concerns regarding your invoice.   IF you received labwork today, you will receive an invoice from [facility]. Please contact [facility] at [phone] with questions or concerns regarding your invoice.   Our billing staff will not be able to assist you with questions regarding bills from these companies.  You will be contacted with the lab results as soon as they are available. The fastest way to get your results is to activate your My Chart account. Instructions are located on the last page of this paperwork. If you have not heard from [place] regarding the results in 2 weeks, please contact this office.

## 2019-01-28 ENCOUNTER — Encounter: Payer: Self-pay | Admitting: Family Medicine

## 2019-01-28 ENCOUNTER — Ambulatory Visit: Payer: Medicare Other | Admitting: Family Medicine

## 2019-03-17 ENCOUNTER — Telehealth: Payer: Self-pay | Admitting: *Deleted

## 2019-03-17 NOTE — Telephone Encounter (Signed)
Schedule AWV.  

## 2019-03-26 ENCOUNTER — Ambulatory Visit (INDEPENDENT_AMBULATORY_CARE_PROVIDER_SITE_OTHER): Payer: Medicare Other | Admitting: Family Medicine

## 2019-03-26 VITALS — BP 130/65 | Ht 70.0 in | Wt 156.0 lb

## 2019-03-26 DIAGNOSIS — Z0000 Encounter for general adult medical examination without abnormal findings: Secondary | ICD-10-CM | POA: Diagnosis not present

## 2019-03-26 NOTE — Progress Notes (Signed)
Presents today for [employer] Visit   Date of last exam:01/27/2019   Interpreter used for this visit?  No  I connected with  Linward Headland on 03/26/19 by a telephone application and verified that I am speaking with the correct person using two identifiers.   I discussed the limitations of evaluation and management by telemedicine. The patient expressed understanding and agreed to proceed.   Patient Care Team: Patient, No Pcp Per as PCP - General ([place]) Melissa Noon, OD as Referring Physician (Optometry)   Other items to address today:  Discussed immunizations Discussed Eye/Dental   Other Screening: Last screening for diabetes: 11/17/2016 Last lipid screening: 10/19/2017  ADVANCE DIRECTIVES: Discussed:  yes On File: no Materials Provided: yes  Immunization status:  Immunization History  Administered Date(s) Administered  . Influenza-Unspecified 02/26/2015  . Tdap 05/27/2012     Health Maintenance Due  Topic Date Due  . PNA vac Low Risk Adult (1 of 2 - PCV13) 10/22/2002     Functional Status Survey: Is the patient deaf or have difficulty hearing?: No(had test good hearing right left ear 65%) Does the patient have difficulty seeing, even when wearing glasses/contacts?: No Does the patient have difficulty concentrating, remembering, or making decisions?: No Does the patient have difficulty walking or climbing stairs?: No Does the patient have difficulty dressing or bathing?: No Does the patient have difficulty doing errands alone such as visiting a doctor's office or shopping?: No   6CIT Screen 03/26/2019 04/09/2017  What Year? 0 points 0 points  What month? 0 points 0 points  What time? 0 points 0 points  Count back from 20 0 points 0 points  Months in reverse 0 points 0 points  Repeat phrase 0 points 2 points  Total Score 0 2        Clinical Support from 03/26/2019 in [place] [facility]  AUDIT-C Score  7        Home Environment:   Lives in a one story home No trouble climbing stairs Yes Grab bars No scattered rugs Adequate lighting/no clutter   Patient Active Problem List   Diagnosis Date Noted  . Squamous cell carcinoma of scalp 10/19/2017  . Dizziness 04/11/2017  . History of prostate cancer 07/21/2016  . Hearing impaired 10/22/2014     Past Medical History:  Diagnosis Date  . Cataract   . Dizziness 04/11/2017  . Glaucoma   . Prostate cancer [facility])      Past Surgical History:  Procedure Laterality Date  . PROSTATE SURGERY  2007   Impant     Family History  Problem Relation Age of Onset  . Diabetes Mother   . Heart disease Brother      Social History   Socioeconomic History  . Marital status: Divorced    Spouse name: Not on file  . Number of children: 2  . Years of education: 16  . Highest education level: Bachelor's degree (e.g., BA, AB, BS)  Occupational History  . Occupation: Retired  Scientific laboratory technician  . Financial resource strain: Not hard at all  . Food insecurity    Worry: Never true    Inability: Never true  . Transportation needs    Medical: No    Non-medical: No  Tobacco Use  . Smoking status: Never Smoker  . Smokeless tobacco: Never Used  Substance and Sexual Activity  . Alcohol use: Yes    Alcohol/week: 14.0 standard drinks    Types: 14 Cans  of beer per week  . Drug use: No  . Sexual activity: Yes  Lifestyle  . Physical activity    Days per week: 2 days    Minutes per session: 60 min  . Stress: Only a little  Relationships  . Social Herbalist on phone: Three times a week    Gets together: Twice a week    Attends religious service: More than 4 times per year    Active member of club or organization: No    Attends meetings of clubs or organizations: Never    Relationship status: Divorced  . Intimate partner violence    Fear of current or ex partner: No    Emotionally abused: No    Physically abused: No    Forced sexual  activity: No  Other Topics Concern  . Not on file  Social History Narrative   Marital status; Divorced.  Dating same male x 2000.      Children:      Lives:  Alone in 2019.      Tobacco: none; in 2019; quit in 1978.      Alcohol:  Drinks 2-3 miller lights per day.        Exercise:  Plays golf two days per week; walks around in malls.      ADLs; drives; performs ADLs; no assistant devices.      Advanced Directives:  None in 2019.  HCPOA:  Son/[employer].  FULL CODE; no prolonged measures.     Right handed      No Known Allergies   Prior to Admission medications   Medication Sig Start Date End Date Taking? Authorizing Provider  ALFALFA PO Take 1 capsule by mouth every morning.   Yes [provider]  ALPRAZolam Duanne Moron) 0.5 MG tablet Take 2 tablets approximately 45 minutes prior to the MRI study, take a third tablet if needed. 04/25/17  Yes Kathrynn Ducking, MD  brimonidine-timolol (COMBIGAN) 0.2-0.5 % ophthalmic solution Place 1 drop into both eyes every 12 (twelve) hours.   Yes [provider]  DANDELION PO Take 1 capsule by mouth every morning.   Yes [provider]  GARLIC PO Take 1 capsule by mouth 3 (three) times daily before meals.   Yes [provider]  latanoprost (XALATAN) 0.005 % ophthalmic solution INT 2 GTS IN THE AFFECTED EYE QHS 10/25/17  Yes [provider]  MILK THISTLE PO Take 1 capsule by mouth every morning.   Yes [provider]  Multiple Vitamin (MULTIVITAMIN WITH MINERALS) TABS Take 1 tablet by mouth daily.   Yes [provider]  OLIVE LEAF PO Take 400 mg every other day by mouth.   Yes [provider]  OVER THE COUNTER MEDICATION Take 1 capsule by mouth every morning. OTC supplement Pyginal (antioxidant)   Yes [provider]  OVER THE COUNTER MEDICATION Take 1 capsule by mouth every morning. OTC acai supplement   Yes [provider]  OVER THE COUNTER MEDICATION Take  1 capsule by mouth every morning. OTC tumeric supplement   Yes [provider]  VALERIAN PO Take 350 mg at bedtime by mouth.   Yes [provider]     Depression screen [facility] 2/9 03/26/2019 02/06/2018 12/03/2017 11/03/2017 10/19/2017  Decreased Interest 0 0 0 0 0  Down, Depressed, Hopeless 0 0 0 0 0  PHQ - 2 Score 0 0 0 0 0     Fall Risk  03/26/2019 01/27/2019 02/06/2018  12/03/2017 11/03/2017  Falls in the past year? 1 1 No No No  Number falls in past yr: 0 0 - - -  Comment was standing on a chair - - - -  Injury with Fall? 0 1 - - -  Comment - - - - -  Risk for fall due to : - Other (Comment) - - -  Risk for fall due to: Comment - - - - -  Follow up Falls evaluation completed;Education provided;Falls prevention discussed Falls evaluation completed - - -      PHYSICAL EXAM: BP 130/65 Comment: taken by patient  Ht 5\' 10"  (1.778 m)   Wt 156 lb (70.8 kg)   BMI 22.38 kg/m    Wt Readings from Last 3 Encounters:  03/26/19 156 lb (70.8 kg)  01/27/19 156 lb 3.2 oz (70.9 kg)  02/06/18 170 lb (77.1 kg)       Education/Counseling provided regarding diet and exercise, prevention of chronic diseases, smoking/tobacco cessation, if applicable, and reviewed "Covered Medicare Preventive Services."

## 2019-03-26 NOTE — Patient Instructions (Signed)
Thank you for taking time to come for your Medicare Wellness Visit. I appreciate your ongoing commitment to your health goals. Please review the following plan we discussed and let me know if I can assist you in the future.  Guy Kennedy LPN  Preventive Care 15 Years and Older, Male Preventive care refers to lifestyle choices and visits with your health care provider that can promote health and wellness. This includes:  A yearly physical exam. This is also called an annual well check.  Regular dental and eye exams.  Immunizations.  Screening for certain conditions.  Healthy lifestyle choices, such as diet and exercise. What can I expect for my preventive care visit? Physical exam Your health care provider will check:  Height and weight. These may be used to calculate body mass index (BMI), which is a measurement that tells if you are at a healthy weight.  Heart rate and blood pressure.  Your skin for abnormal spots. Counseling Your health care provider may ask you questions about:  Alcohol, tobacco, and drug use.  Emotional well-being.  Home and relationship well-being.  Sexual activity.  Eating habits.  History of falls.  Memory and ability to understand (cognition).  Work and work Statistician. What immunizations do I need?  Influenza (flu) vaccine  This is recommended every year. Tetanus, diphtheria, and pertussis (Tdap) vaccine  You may need a Td booster every 10 years. Varicella (chickenpox) vaccine  You may need this vaccine if you have not already been vaccinated. Zoster (shingles) vaccine  You may need this after age 36. Pneumococcal conjugate (PCV13) vaccine  One dose is recommended after age 9. Pneumococcal polysaccharide (PPSV23) vaccine  One dose is recommended after age 33. Measles, mumps, and rubella (MMR) vaccine  You may need at least one dose of MMR if you were born in 1957 or later. You may also need a second dose. Meningococcal  conjugate (MenACWY) vaccine  You may need this if you have certain conditions. Hepatitis A vaccine  You may need this if you have certain conditions or if you travel or work in places where you may be exposed to hepatitis A. Hepatitis B vaccine  You may need this if you have certain conditions or if you travel or work in places where you may be exposed to hepatitis B. Haemophilus influenzae type b (Hib) vaccine  You may need this if you have certain conditions. You may receive vaccines as individual doses or as more than one vaccine together in one shot (combination vaccines). Talk with your health care provider about the risks and benefits of combination vaccines. What tests do I need? Blood tests  Lipid and cholesterol levels. These may be checked every 5 years, or more frequently depending on your overall health.  Hepatitis C test.  Hepatitis B test. Screening  Lung cancer screening. You may have this screening every year starting at age 42 if you have a 30-pack-year history of smoking and currently smoke or have quit within the past 15 years.  Colorectal cancer screening. All adults should have this screening starting at age 40 and continuing until age 17. Your health care provider may recommend screening at age 39 if you are at increased risk. You will have tests every 1-10 years, depending on your results and the type of screening test.  Prostate cancer screening. Recommendations will vary depending on your family history and other risks.  Diabetes screening. This is done by checking your blood sugar (glucose) after you have not eaten for  a while (fasting). You may have this done every 1-3 years.  Abdominal aortic aneurysm (AAA) screening. You may need this if you are a current or former smoker.  Sexually transmitted disease (STD) testing. Follow these instructions at home: Eating and drinking  Eat a diet that includes fresh fruits and vegetables, whole grains, lean  protein, and low-fat dairy products. Limit your intake of foods with high amounts of sugar, saturated fats, and salt.  Take vitamin and mineral supplements as recommended by your health care provider.  Do not drink alcohol if your health care provider tells you not to drink.  If you drink alcohol: ? Limit how much you have to 0-2 drinks a day. ? Be aware of how much alcohol is in your drink. In the [place] one drink equals one 12 oz bottle of beer (355 mL), one 5 oz glass of wine (148 mL), or one 1 oz glass of hard liquor (44 mL). Lifestyle  Take daily care of your teeth and gums.  Stay active. Exercise for at least 30 minutes on 5 or more days each week.  Do not use any products that contain nicotine or tobacco, such as cigarettes, e-cigarettes, and chewing tobacco. If you need help quitting, ask your health care provider.  If you are sexually active, practice safe sex. Use a condom or other form of protection to prevent STIs (sexually transmitted infections).  Talk with your health care provider about taking a low-dose aspirin or statin. What's next?  Visit your health care provider once a year for a well check visit.  Ask your health care provider how often you should have your eyes and teeth checked.  Stay up to date on all vaccines. This information is not intended to replace advice given to you by your health care provider. Make sure you discuss any questions you have with your health care provider. Document Released: 06/11/2015 Document Revised: 05/09/2018 Document Reviewed: 05/09/2018 Elsevier Patient Education  2020 [employer].

## 2019-03-30 ENCOUNTER — Encounter: Payer: Self-pay | Admitting: Family Medicine

## 2019-03-30 NOTE — Progress Notes (Signed)
Note reviewed, and agree with documentation and plan. Signed,   Merri Ray, MD [facility] [place].  03/30/19 2:00 PM

## 2019-04-27 IMAGING — MR MR MRA HEAD W/O CM
1 series · 20 of 48 positions shown · non-contrast
Comparison: None.

CLINICAL DATA: Dizziness over the last month.

EXAM:
MRA HEAD WITHOUT CONTRAST
TECHNIQUE: Angiographic images of the Circle of Willis were obtained using MRA
technique without intravenous contrast.

[Series 3: (id) mt fs · axial · 1.4mm · 0.39mm/px · z∈[-14,+76]mm · 20 of 139 slices shown]
[im 1/139]
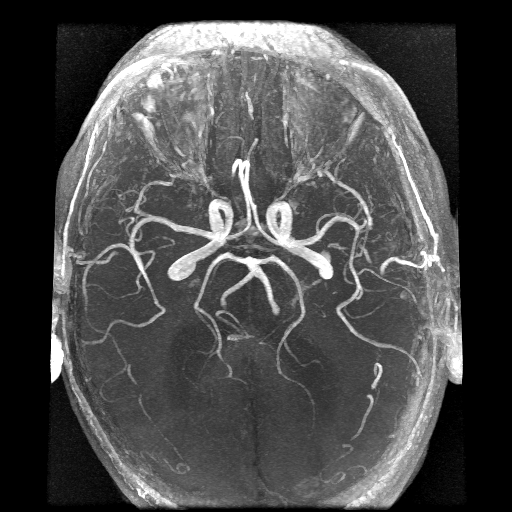
[im 3/139]
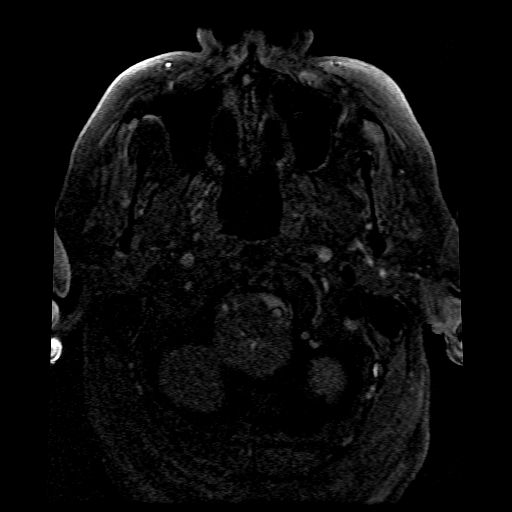
[im 6/139]
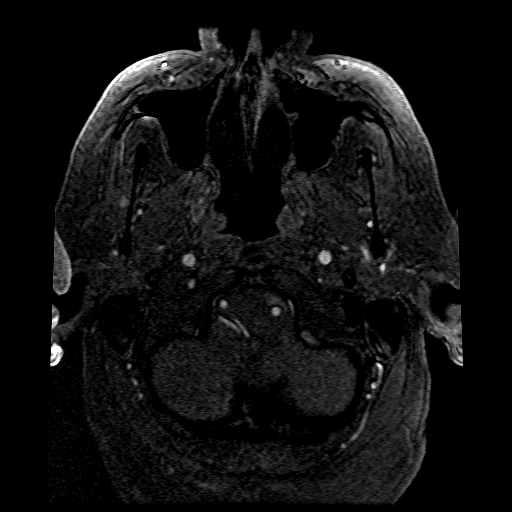
[im 9/139]
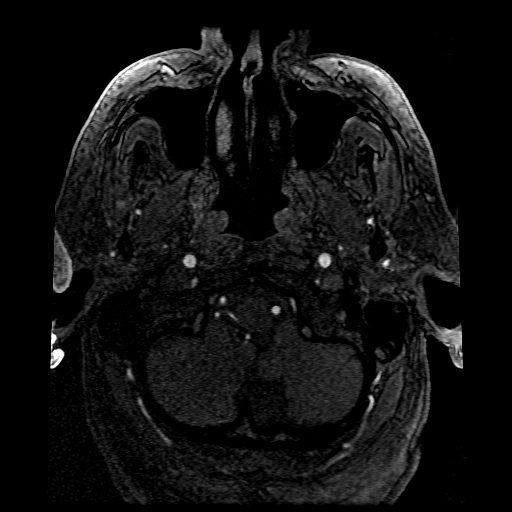
[im 12/139]
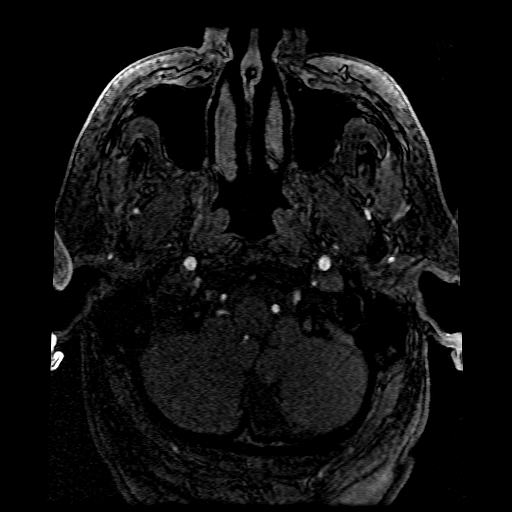
[im 15/139]
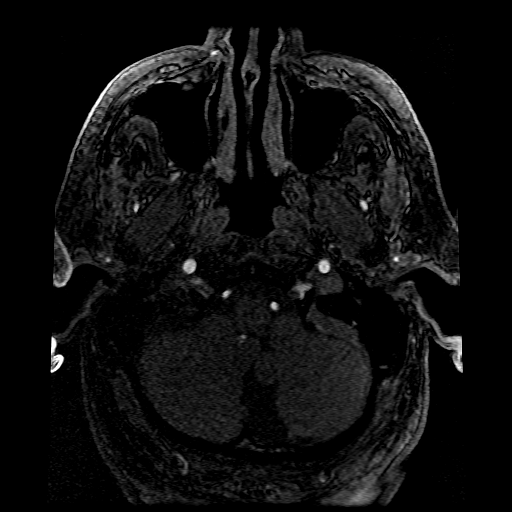
[im 18/139]
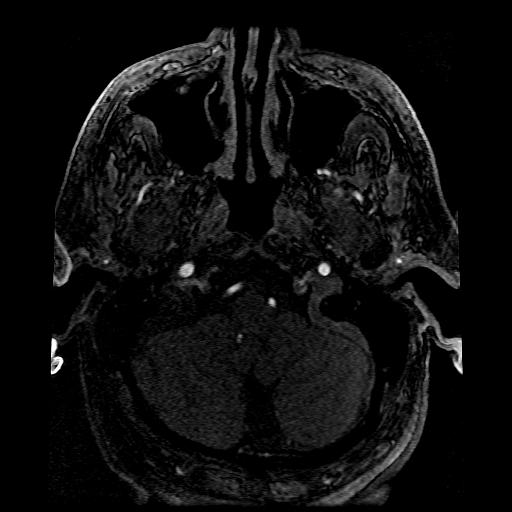
[im 21/139]
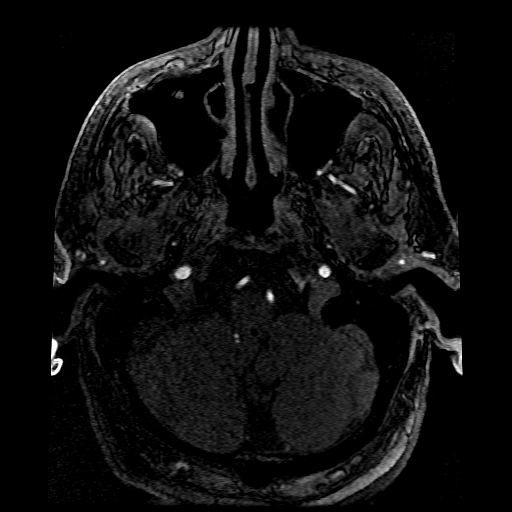
[im 24/139]
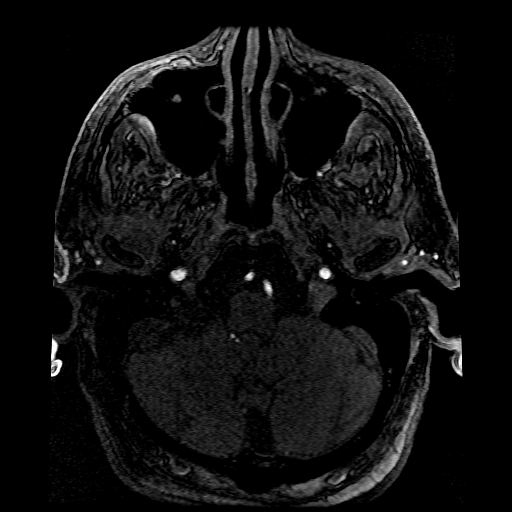
[im 27/139]
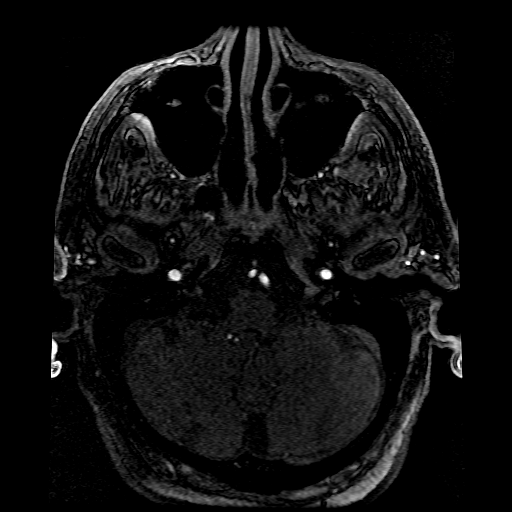
[im 30/139]
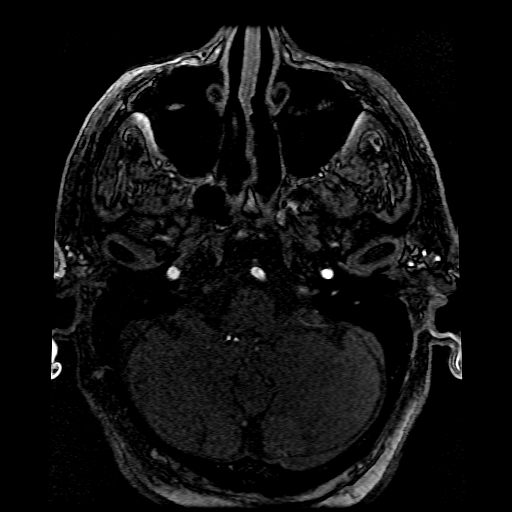
[im 33/139]
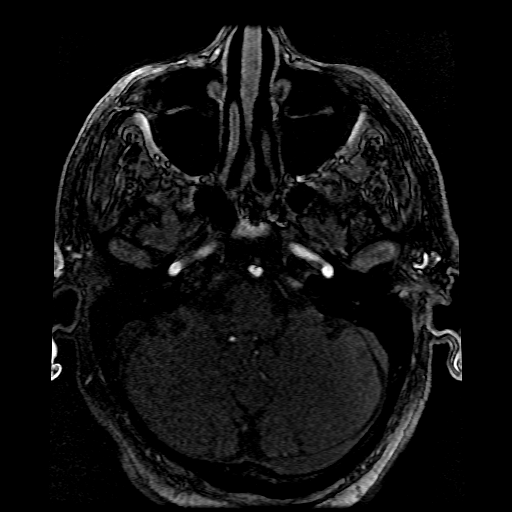
[im 45/139]
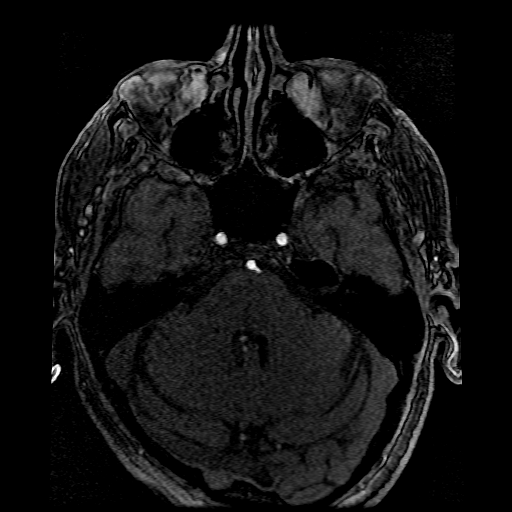
[im 62/139]
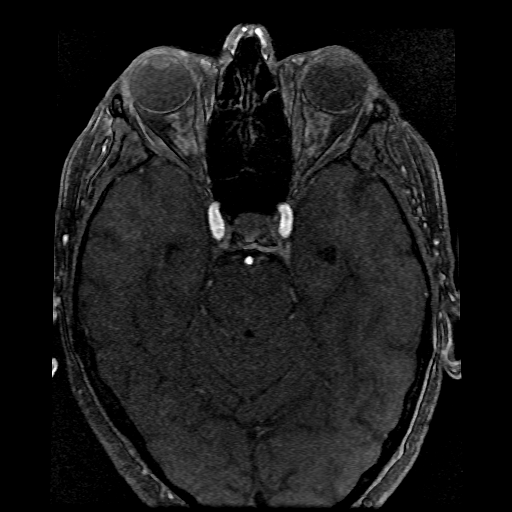
[im 71/139]
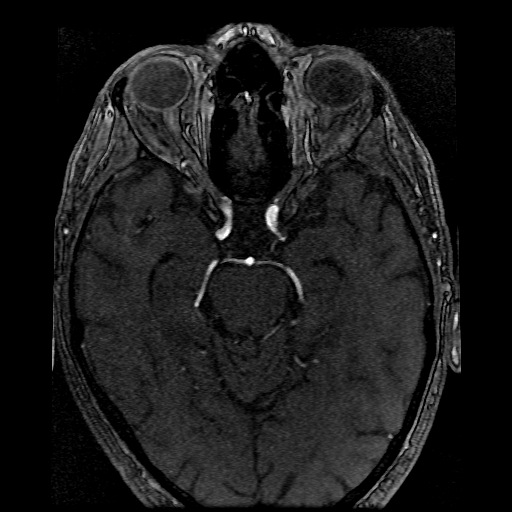
[im 80/139]
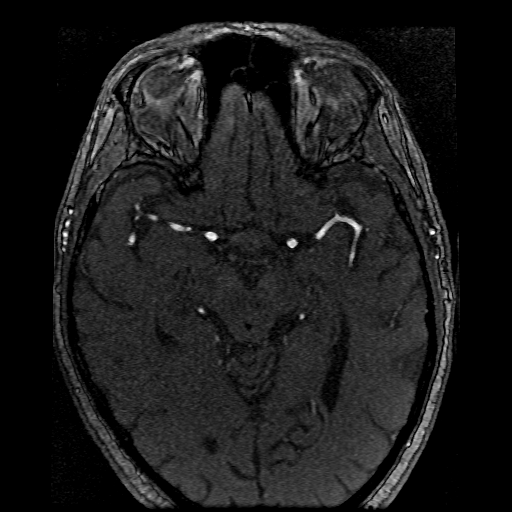
[im 97/139]
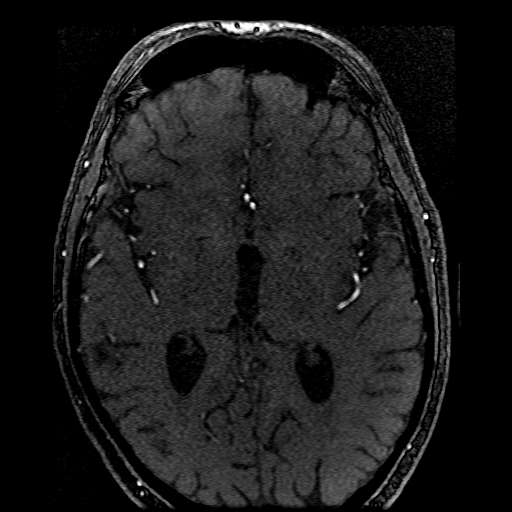
[im 115/139]
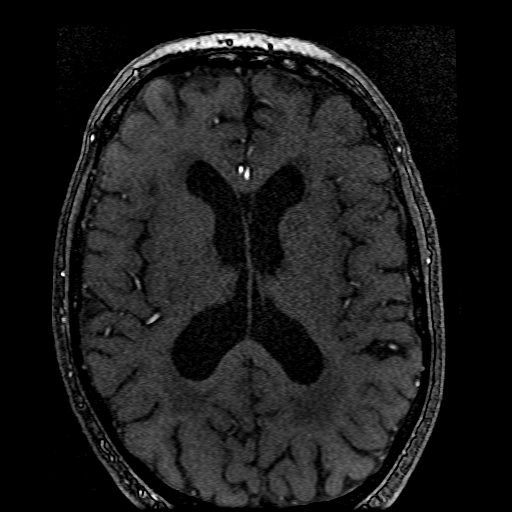
[im 118/139]
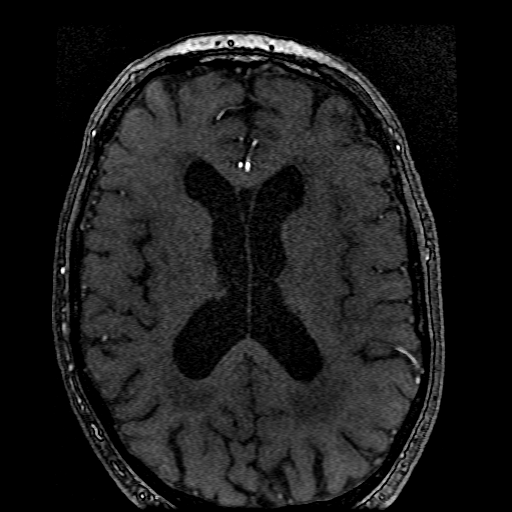
[im 133/139]
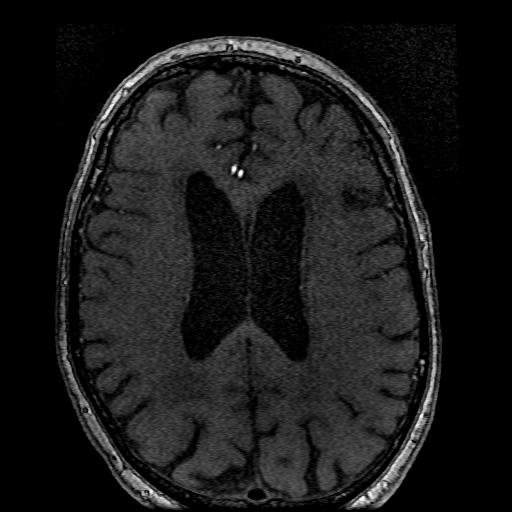

[20 of 48 positions shown; findings below may reference images not displayed]

FINDINGS: Both internal carotid arteries are widely patent into the brain. No
siphon stenosis. The anterior and middle cerebral vessels are patent
without proximal stenosis, aneurysm or vascular malformation.

Both vertebral arteries are widely patent to the basilar. No basilar
stenosis. Incidental basilar fenestration, not significant.
Posterior circulation branch vessels appear normal.
IMPRESSION: Normal examination. No vascular pathology identified to explain
dizziness.

## 2019-07-11 DIAGNOSIS — H524 Presbyopia: Secondary | ICD-10-CM | POA: Diagnosis not present

## 2019-07-11 DIAGNOSIS — H401131 Primary open-angle glaucoma, bilateral, mild stage: Secondary | ICD-10-CM | POA: Diagnosis not present

## 2019-07-11 DIAGNOSIS — H2513 Age-related nuclear cataract, bilateral: Secondary | ICD-10-CM | POA: Diagnosis not present

## 2019-12-23 DIAGNOSIS — H2513 Age-related nuclear cataract, bilateral: Secondary | ICD-10-CM | POA: Diagnosis not present

## 2019-12-23 DIAGNOSIS — H401131 Primary open-angle glaucoma, bilateral, mild stage: Secondary | ICD-10-CM | POA: Diagnosis not present

## 2020-01-15 ENCOUNTER — Ambulatory Visit (INDEPENDENT_AMBULATORY_CARE_PROVIDER_SITE_OTHER): Payer: Medicare Other | Admitting: Family Medicine

## 2020-01-15 ENCOUNTER — Encounter: Payer: Self-pay | Admitting: Family Medicine

## 2020-01-15 ENCOUNTER — Other Ambulatory Visit: Payer: Self-pay

## 2020-01-15 VITALS — BP 155/86 | HR 77 | Temp 98.0°F | Ht 70.0 in | Wt 144.0 lb

## 2020-01-15 DIAGNOSIS — H9319 Tinnitus, unspecified ear: Secondary | ICD-10-CM | POA: Diagnosis not present

## 2020-01-15 DIAGNOSIS — E7800 Pure hypercholesterolemia, unspecified: Secondary | ICD-10-CM | POA: Diagnosis not present

## 2020-01-15 DIAGNOSIS — I679 Cerebrovascular disease, unspecified: Secondary | ICD-10-CM

## 2020-01-15 DIAGNOSIS — R030 Elevated blood-pressure reading, without diagnosis of hypertension: Secondary | ICD-10-CM | POA: Diagnosis not present

## 2020-01-15 DIAGNOSIS — R42 Dizziness and giddiness: Secondary | ICD-10-CM | POA: Diagnosis not present

## 2020-01-15 NOTE — Patient Instructions (Addendum)
   You look to be in very good shape for a man 84 years old.  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  Continue taking the aspirin daily as you are recommended to.  High-dose aspirin can cause the ringing in the ear, but the low-dose 81 mg 1 daily should not cause any problems with that.  Usually ringing in the ears just is a symptom that also comes from aging.  Return in about 6 months or as needed.  Make sure you get a Covid vaccine late this year when it is been 8 months from the last vaccine.  They should be available by that time.  Get your annual flu shot.  We do not yet have the senior flu shot but they are being released soon.  I will let you know the results of your labs.  If you have lab work done today you will be contacted with your lab results within the next 2 weeks.  If you have not heard from [place] then please contact us. The fastest way to get your results is to register for My Chart.   IF you received an x-ray today, you will receive an invoice from [facility]. Please contact [facility] at [phone] with questions or concerns regarding your invoice.   IF you received labwork today, you will receive an invoice from [facility]. Please contact [facility] at [phone] with questions or concerns regarding your invoice.   Our billing staff will not be able to assist you with questions regarding bills from these companies.  You will be contacted with the lab results as soon as they are available. The fastest way to get your results is to activate your My Chart account. Instructions are located on the last page of this paperwork. If you have not heard from [place] regarding the results in 2 weeks, please contact this office.

## 2020-01-15 NOTE — Progress Notes (Signed)
Patient ID: Guy Hutchinson, male    DOB: 04-30-1936  Age: 84 y.o. MRN: [id number]  Chief Complaint  Patient presents with  . dizzyness    in the morning lasting hours/ mentions MRA/MRI in 2018   . requests labs    CBC, lipids, cmp    Subjective:   Patient is here for routine exam.  He still has some dizziness.  He has been worked up in the past and told it was from small vessel disease and basilar artery disease.  He is supposed to take an aspirin daily.  He complains of the ringing in the ears.  I explained to him that that can be done by high-dose aspirin but not a low-dose aspirin generally.  He stays active.  Still sings a lot.  Is active in her church.  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  He is divorced.  Has a woman friend but lives independently.  Current allergies, medications, problem list, past/family and social histories reviewed.  Objective:  BP (!) 155/86   Pulse 77   Temp 98 F (36.7 C)   Ht 5\' 10"  (1.778 m)   Wt 144 lb (65.3 kg)   SpO2 96%   BMI 20.66 kg/m   Healthy man in no acute distress.  Blood pressure noted.  Eyes PERRL.  Throat clear.  TMs normal.  Neck supple without nodes or thyromegaly.  No carotid bruits.  He does have a a systolic murmur in the left sternal border and apex area and mildly in the right upper sternal border.  And it sounds like a chronic atherosclerotic type murmur and nothing major.  Chest is clear to auscultation.  Abdomen soft that mass tenderness.  Extremities unremarkable.  Skin normal. Assessment & Plan:   Assessment: 1. Dizziness and giddiness   2. Small vessel disease, cerebrovascular   3. Tinnitus, unspecified laterality   4. Elevated blood pressure reading   5. Elevated cholesterol       Plan: See instructions.  Return as needed or once or twice a year. Orders Placed This Encounter  Procedures  . CBC  . Comprehensive metabolic panel  . Lipid panel  . Hemoglobin A1c    No orders of the defined types were  placed in this encounter.        Patient Instructions     You look to be in very good shape for a man 84 years old.  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  Continue taking the aspirin daily as you are recommended to.  High-dose aspirin can cause the ringing in the ear, but the low-dose 81 mg 1 daily should not cause any problems with that.  Usually ringing in the ears just is a symptom that also comes from aging.  Return in about 6 months or as needed.  Make sure you get a Covid vaccine late this year when it is been 8 months from the last vaccine.  They should be available by that time.  Get your annual flu shot.  We do not yet have the senior flu shot but they are being released soon.  I will let you know the results of your labs.  If you have lab work done today you will be contacted with your lab results within the next 2 weeks.  If you have not heard from [place] then please contact us. The fastest way to get your results  is to register for My Chart.   IF you received an x-ray today, you will receive an invoice from [facility]. Please contact [facility] at [phone] with questions or concerns regarding your invoice.   IF you received labwork today, you will receive an invoice from [facility]. Please contact [facility] at [phone] with questions or concerns regarding your invoice.   Our billing staff will not be able to assist you with questions regarding bills from these companies.  You will be contacted with the lab results as soon as they are available. The fastest way to get your results is to activate your My Chart account. Instructions are located on the last page of this paperwork. If you have not heard from [place] regarding the results in 2 weeks, please contact this office.         Return in about 6 months (around 07/17/2020), or Routine checkup.   Ruben Reason, MD  01/15/2020

## 2020-01-16 LAB — CBC
Hematocrit: 39.9 % (ref 37.5–51.0)
Hemoglobin: 14 g/dL (ref 13.0–17.7)
MCH: 34.4 pg — ABNORMAL HIGH (ref 26.6–33.0)
MCHC: 35.1 g/dL (ref 31.5–35.7)
MCV: 98 fL — ABNORMAL HIGH (ref 79–97)
Platelets: 196 10*3/uL (ref 150–450)
RBC: 4.07 x10E6/uL — ABNORMAL LOW (ref 4.14–5.80)
RDW: 12.7 % (ref 11.6–15.4)
WBC: 6.6 10*3/uL (ref 3.4–10.8)

## 2020-01-16 LAB — COMPREHENSIVE METABOLIC PANEL
ALT: 11 IU/L (ref 0–44)
AST: 19 IU/L (ref 0–40)
Albumin/Globulin Ratio: 1.8 (ref 1.2–2.2)
Albumin: 4.5 g/dL (ref 3.6–4.6)
Alkaline Phosphatase: 82 IU/L (ref 48–121)
BUN/Creatinine Ratio: 15 (ref 10–24)
BUN: 12 mg/dL (ref 8–27)
Bilirubin Total: 0.8 mg/dL (ref 0.0–1.2)
CO2: 20 mmol/L (ref 20–29)
Calcium: 9.2 mg/dL (ref 8.6–10.2)
Chloride: 98 mmol/L (ref 96–106)
Creatinine, Ser: 0.8 mg/dL (ref 0.76–1.27)
GFR calc Af Amer: 96 mL/min/{1.73_m2} (ref >59)
GFR calc non Af Amer: 83 mL/min/{1.73_m2} (ref >59)
Globulin, Total: 2.5 g/dL (ref 1.5–4.5)
Glucose: 89 mg/dL (ref 65–99)
Potassium: 4.5 mmol/L (ref 3.5–5.2)
Sodium: 133 mmol/L — ABNORMAL LOW (ref 134–144)
Total Protein: 7 g/dL (ref 6.0–8.5)

## 2020-01-16 LAB — HEMOGLOBIN A1C
Est. average glucose Bld gHb Est-mCnc: 111 mg/dL
Hgb A1c MFr Bld: 5.5 % (ref 4.8–5.6)

## 2020-01-16 LAB — LIPID PANEL
Chol/HDL Ratio: 4 ratio (ref 0.0–5.0)
Cholesterol, Total: 194 mg/dL (ref 100–199)
HDL: 49 mg/dL (ref >39)
LDL Chol Calc (NIH): 125 mg/dL — ABNORMAL HIGH (ref 0–99)
Triglycerides: 110 mg/dL (ref 0–149)
VLDL Cholesterol Cal: 20 mg/dL (ref 5–40)

## 2020-01-19 ENCOUNTER — Ambulatory Visit: Payer: Medicare Other | Admitting: Family Medicine

## 2020-01-19 ENCOUNTER — Encounter: Payer: Self-pay | Admitting: Radiology

## 2020-03-10 ENCOUNTER — Encounter: Payer: Self-pay | Admitting: Emergency Medicine

## 2020-03-10 ENCOUNTER — Other Ambulatory Visit: Payer: Self-pay

## 2020-03-10 ENCOUNTER — Ambulatory Visit (INDEPENDENT_AMBULATORY_CARE_PROVIDER_SITE_OTHER): Payer: Medicare Other

## 2020-03-10 ENCOUNTER — Ambulatory Visit (INDEPENDENT_AMBULATORY_CARE_PROVIDER_SITE_OTHER): Payer: Medicare Other | Admitting: Emergency Medicine

## 2020-03-10 VITALS — BP 174/85 | HR 69 | Temp 98.0°F | Resp 16 | Ht 70.0 in | Wt 144.0 lb

## 2020-03-10 DIAGNOSIS — M810 Age-related osteoporosis without current pathological fracture: Secondary | ICD-10-CM | POA: Diagnosis not present

## 2020-03-10 DIAGNOSIS — I708 Atherosclerosis of other arteries: Secondary | ICD-10-CM | POA: Diagnosis not present

## 2020-03-10 DIAGNOSIS — S8001XA Contusion of right knee, initial encounter: Secondary | ICD-10-CM | POA: Diagnosis not present

## 2020-03-10 DIAGNOSIS — M25461 Effusion, right knee: Secondary | ICD-10-CM | POA: Diagnosis not present

## 2020-03-10 DIAGNOSIS — M1711 Unilateral primary osteoarthritis, right knee: Secondary | ICD-10-CM | POA: Diagnosis not present

## 2020-03-10 DIAGNOSIS — S8991XA Unspecified injury of right lower leg, initial encounter: Secondary | ICD-10-CM | POA: Diagnosis not present

## 2020-03-10 NOTE — Patient Instructions (Addendum)
If you have lab work done today you will be contacted with your lab results within the next 2 weeks.  If you have not heard from [place] then please contact us. The fastest way to get your results is to register for My Chart.   IF you received an x-ray today, you will receive an invoice from [facility]. Please contact [facility] at [phone] with questions or concerns regarding your invoice.   IF you received labwork today, you will receive an invoice from [place]. Please contact [facility] at [phone] with questions or concerns regarding your invoice.   Our billing staff will not be able to assist you with questions regarding bills from these companies.  You will be contacted with the lab results as soon as they are available. The fastest way to get your results is to activate your My Chart account. Instructions are located on the last page of this paperwork. If you have not heard from [place] regarding the results in 2 weeks, please contact this office.     Knee Sprain  A knee sprain is a stretch or tear in a knee ligament. Knee ligaments are bands of tissue that connect bones in the knee to each other. Follow these instructions at home: If you have a splint or brace:  Wear the splint or brace as told by your doctor. Remove it only as told by your doctor.  Loosen the splint or brace if your toes tingle, get numb, or turn cold and blue.  Keep the splint or brace clean.  If the splint or brace is not waterproof: ? Do not let it get wet. ? Cover it with a watertight covering when you take a bath or a shower. If you have a cast:  Do not stick anything inside the cast to scratch your skin.  Check the skin around the cast every day. Tell your doctor about any concerns.  You may put lotion on dry skin around the edges of the cast. Do not put lotion on the skin underneath the cast.  Keep the cast clean.  If the cast is not waterproof: ? Do not let it get  wet. ? Cover it with a watertight covering when you take a bath or a shower. Managing pain, stiffness, and swelling   Gently move your toes often to avoid stiffness and to lessen swelling.  Raise (elevate) the injured area above the level of your heart while you are sitting or lying down.  Take over-the-counter and prescription medicines only as told by your doctor.  If directed, put ice on the injured area. ? If you have a removable splint or brace, remove it as told by your doctor. ? Put ice in a plastic bag. ? Place a towel between your skin and the bag or between your cast and the bag. ? Leave the ice on for 20 minutes, 2-3 times a day. General instructions  Do exercises as told by your doctor.  Keep all follow-up visits as told by your doctor. This is important. Contact a doctor if:  You have pain that gets worse.  The cast, brace, or splint does not fit right.  The cast, brace, or splint gets damaged. Get help right away if:  You cannot lean on your knee to stand or walk.  You cannot move the injured area.  You knee buckles or you have pain after you walk only a few steps.  You have very bad pain, swelling, or numbness below  the cast, brace, or splint. Summary  A knee sprain is a stretch or tear in a band (ligament) that connects your knee bones to each other.  You may need to wear a splint, brace, or cast to help your knee get better.  Contact your doctor if you have very bad pain, swelling, or numbness, or if you cannot walk. This information is not intended to replace advice given to you by your health care provider. Make sure you discuss any questions you have with your health care provider. Document Revised: 09/06/2018 Document Reviewed: 02/01/2016 Elsevier Patient Education  [place].

## 2020-03-10 NOTE — Progress Notes (Signed)
Guy Hutchinson 84 y.o.   Chief Complaint  Patient presents with  . Knee Injury    Right per patient a week ago after a fall    HISTORY OF PRESENT ILLNESS: This is a 84 y.o. male complaining of right knee injury sustained 1 week ago after a fall.  Still has some bruising and swelling. No other significant injuries or concerns.  HPI   Prior to Admission medications   Medication Sig Start Date End Date Taking? Authorizing Provider  ALFALFA PO Take 1 capsule by mouth every morning.   Yes [provider]  ALPRAZolam Duanne Moron) 0.5 MG tablet Take 2 tablets approximately 45 minutes prior to the MRI study, take a third tablet if needed. 04/25/17  Yes Kathrynn Ducking, MD  brimonidine-timolol (COMBIGAN) 0.2-0.5 % ophthalmic solution Place 1 drop into both eyes every 12 (twelve) hours.   Yes [provider]  DANDELION PO Take 1 capsule by mouth every morning.   Yes [provider]  GARLIC PO Take 1 capsule by mouth 3 (three) times daily before meals.   Yes [provider]  latanoprost (XALATAN) 0.005 % ophthalmic solution INT 2 GTS IN THE AFFECTED EYE QHS 10/25/17  Yes [provider]  MILK THISTLE PO Take 1 capsule by mouth every morning.   Yes [provider]  Multiple Vitamin (MULTIVITAMIN WITH MINERALS) TABS Take 1 tablet by mouth daily.   Yes [provider]  OLIVE LEAF PO Take 400 mg every other day by mouth.   Yes [provider]  OVER THE COUNTER MEDICATION Take 1 capsule by mouth every morning. OTC supplement Pyginal (antioxidant)   Yes [provider]  OVER THE COUNTER MEDICATION Take 1 capsule by mouth every morning. OTC acai supplement   Yes [provider]  OVER THE COUNTER MEDICATION Take 1 capsule by mouth every morning. OTC tumeric supplement   Yes [provider]  UNABLE TO FIND Med Name: paudarco   Yes [provider]  UNABLE TO FIND Med Name: elderberry   Yes [provider]  VALERIAN PO Take 350 mg at bedtime by mouth.   Yes [provider]    No Known Allergies  Patient Active Problem List   Diagnosis Date Noted  . Squamous cell carcinoma of scalp 10/19/2017  . Dizziness 04/11/2017  . History of prostate cancer 07/21/2016  . Hearing impaired 10/22/2014    Past Medical History:  Diagnosis Date  . Cataract   . Dizziness 04/11/2017  . Glaucoma   . Prostate cancer [facility])     Past Surgical History:  Procedure Laterality Date  . PROSTATE SURGERY  2007   Impant    Social History   Socioeconomic History  . Marital status: Divorced    Spouse name: Not on file  . Number of children: 2  . Years of education: 16  . Highest education level: Bachelor's degree (e.g., BA, AB, BS)  Occupational History  . Occupation: Retired  Tobacco Use  . Smoking status: Never Smoker  . Smokeless tobacco: Never Used  Vaping Use  . Vaping Use: Never used  Substance and Sexual Activity  . Alcohol use: Yes    Alcohol/week: 14.0 standard drinks    Types: 14 Cans of beer per week  . Drug use: No  . Sexual activity: Yes  Other Topics Concern  . Not on file  Social History Narrative   Marital status; Divorced.  Dating same male x 2000.  Children:      Lives:  Alone in 2019.      Tobacco: none; in 2019; quit in 1978.      Alcohol:  Drinks 2-3 miller lights per day.        Exercise:  Plays golf two days per week; walks around in malls.      ADLs; drives; performs ADLs; no assistant devices.      Advanced Directives:  None in 2019.  HCPOA:  Son/[employer].  FULL CODE; no prolonged measures.     Right handed    Social Determinants of Health   Financial Resource Strain:   . Difficulty of Paying Living Expenses: Not on file  Food Insecurity:   . Worried About Charity fundraiser in the Last Year: Not on file  . Ran Out of Food in the Last Year: Not on file  Transportation Needs:   . Lack of Transportation (Medical): Not on  file  . Lack of Transportation (Non-Medical): Not on file  Physical Activity:   . Days of Exercise per Week: Not on file  . Minutes of Exercise per Session: Not on file  Stress:   . Feeling of Stress : Not on file  Social Connections:   . Frequency of Communication with Friends and Family: Not on file  . Frequency of Social Gatherings with Friends and Family: Not on file  . Attends Religious Services: Not on file  . Active Member of Clubs or Organizations: Not on file  . Attends Archivist Meetings: Not on file  . Marital Status: Not on file  Intimate Partner Violence:   . Fear of Current or Ex-Partner: Not on file  . Emotionally Abused: Not on file  . Physically Abused: Not on file  . Sexually Abused: Not on file    Family History  Problem Relation Age of Onset  . Diabetes Mother   . Heart disease Brother      Review of Systems  Constitutional: Negative.  Negative for chills and fever.  HENT: Negative.  Negative for congestion and sore throat.   Respiratory: Negative.  Negative for cough and shortness of breath.   Cardiovascular: Negative.  Negative for chest pain and palpitations.  Gastrointestinal: Negative.  Negative for abdominal pain, nausea and vomiting.  Genitourinary: Negative.   Skin: Negative.  Negative for rash.  Neurological: Negative for dizziness and headaches.  All other systems reviewed and are negative.    Vitals:   03/10/20 1138  BP: (!) 174/85  Pulse: 69  Resp: 16  Temp: 98 F (36.7 C)  SpO2: 99%     Physical Exam Constitutional:      Appearance: Normal appearance.  HENT:     Head: Normocephalic.  Cardiovascular:     Rate and Rhythm: Normal rate.  Pulmonary:     Effort: Pulmonary effort is normal.  Musculoskeletal:     Comments: Right knee: Positive old bruising with residual swelling.  No significant tenderness.  Small hematoma felt to popliteal area.  No other significant findings.  Skin:    General: Skin is warm and dry.    Neurological:     General: No focal deficit present.     Mental Status: He is alert and oriented to person, place, and time.  Psychiatric:        Mood and Affect: Mood normal.        Behavior: Behavior normal.    DG Knee Complete 4 Views Right  Result Date: 03/10/2020 CLINICAL  DATA:  Pain following fall EXAM: RIGHT KNEE - COMPLETE 4+ VIEW COMPARISON:  None. FINDINGS: Frontal, tunnel, lateral, and sunrise patellar images were obtained. Bones are osteoporotic. There is no fracture or dislocation. There is a focal joint effusion. There is severe narrowing medially and in the patellofemoral joint region with milder narrowing laterally. There is spurring in all compartments. No erosive change. There are foci of arterial vascular calcification. IMPRESSION: Osteoporosis. Extensive arthropathy, most severe medially and in the patellofemoral joint regions. There is a focal joint effusion. No fracture or dislocation. Foci of vascular calcification noted. Electronically Signed   By: Lowella Grip III M.D.   On: 03/10/2020 13:50     ASSESSMENT & PLAN: Namish was seen today for knee injury.  Diagnoses and all orders for this visit:  Injury of right knee, initial encounter -     DG Knee Complete 4 Views Right  Contusion of right knee, initial encounter    Patient Instructions       If you have lab work done today you will be contacted with your lab results within the next 2 weeks.  If you have not heard from [place] then please contact us. The fastest way to get your results is to register for My Chart.   IF you received an x-ray today, you will receive an invoice from [facility]. Please contact [facility] at [phone] with questions or concerns regarding your invoice.   IF you received labwork today, you will receive an invoice from [place]. Please contact [facility] at [phone] with questions or concerns regarding your invoice.   Our billing staff will not be  able to assist you with questions regarding bills from these companies.  You will be contacted with the lab results as soon as they are available. The fastest way to get your results is to activate your My Chart account. Instructions are located on the last page of this paperwork. If you have not heard from [place] regarding the results in 2 weeks, please contact this office.     Knee Sprain  A knee sprain is a stretch or tear in a knee ligament. Knee ligaments are bands of tissue that connect bones in the knee to each other. Follow these instructions at home: If you have a splint or brace:  Wear the splint or brace as told by your doctor. Remove it only as told by your doctor.  Loosen the splint or brace if your toes tingle, get numb, or turn cold and blue.  Keep the splint or brace clean.  If the splint or brace is not waterproof: ? Do not let it get wet. ? Cover it with a watertight covering when you take a bath or a shower. If you have a cast:  Do not stick anything inside the cast to scratch your skin.  Check the skin around the cast every day. Tell your doctor about any concerns.  You may put lotion on dry skin around the edges of the cast. Do not put lotion on the skin underneath the cast.  Keep the cast clean.  If the cast is not waterproof: ? Do not let it get wet. ? Cover it with a watertight covering when you take a bath or a shower. Managing pain, stiffness, and swelling   Gently move your toes often to avoid stiffness and to lessen swelling.  Raise (elevate) the injured area above the level of your heart while you are sitting or lying down.  Take over-the-counter and  prescription medicines only as told by your doctor.  If directed, put ice on the injured area. ? If you have a removable splint or brace, remove it as told by your doctor. ? Put ice in a plastic bag. ? Place a towel between your skin and the bag or between your cast and the bag. ? Leave the ice on  for 20 minutes, 2-3 times a day. General instructions  Do exercises as told by your doctor.  Keep all follow-up visits as told by your doctor. This is important. Contact a doctor if:  You have pain that gets worse.  The cast, brace, or splint does not fit right.  The cast, brace, or splint gets damaged. Get help right away if:  You cannot lean on your knee to stand or walk.  You cannot move the injured area.  You knee buckles or you have pain after you walk only a few steps.  You have very bad pain, swelling, or numbness below the cast, brace, or splint. Summary  A knee sprain is a stretch or tear in a band (ligament) that connects your knee bones to each other.  You may need to wear a splint, brace, or cast to help your knee get better.  Contact your doctor if you have very bad pain, swelling, or numbness, or if you cannot walk. This information is not intended to replace advice given to you by your health care provider. Make sure you discuss any questions you have with your health care provider. Document Revised: 09/06/2018 Document Reviewed: 02/01/2016 Elsevier Patient Education  2020 Elsevier Inc.      Agustina Caroli, MD [facility]

## 2020-03-11 DIAGNOSIS — H00015 Hordeolum externum left lower eyelid: Secondary | ICD-10-CM | POA: Diagnosis not present

## 2020-03-25 DIAGNOSIS — H0015 Chalazion left lower eyelid: Secondary | ICD-10-CM | POA: Diagnosis not present

## 2020-04-02 ENCOUNTER — Ambulatory Visit: Payer: Medicare Other | Admitting: Family Medicine

## 2020-04-02 ENCOUNTER — Other Ambulatory Visit: Payer: Self-pay

## 2020-04-02 ENCOUNTER — Encounter: Payer: Self-pay | Admitting: Registered Nurse

## 2020-04-02 ENCOUNTER — Ambulatory Visit (INDEPENDENT_AMBULATORY_CARE_PROVIDER_SITE_OTHER): Payer: Medicare Other | Admitting: Registered Nurse

## 2020-04-02 VITALS — BP 170/77 | HR 80 | Temp 98.5°F | Resp 18 | Ht 70.0 in | Wt 145.8 lb

## 2020-04-02 DIAGNOSIS — R2241 Localized swelling, mass and lump, right lower limb: Secondary | ICD-10-CM

## 2020-04-02 NOTE — Patient Instructions (Signed)
° ° ° °  If you have lab work done today you will be contacted with your lab results within the next 2 weeks.  If you have not heard from us then please contact us. The fastest way to get your results is to register for My Chart. ° ° °IF you received an x-ray today, you will receive an invoice from [facility]. Please contact [facility] at [phone] with questions or concerns regarding your invoice.  ° °IF you received labwork today, you will receive an invoice from [facility]. Please contact [facility] at [phone] with questions or concerns regarding your invoice.  ° °Our billing staff will not be able to assist you with questions regarding bills from these companies. ° °You will be contacted with the lab results as soon as they are available. The fastest way to get your results is to activate your My Chart account. Instructions are located on the last page of this paperwork. If you have not heard from us regarding the results in 2 weeks, please contact this office. °  ° ° ° °

## 2020-04-05 ENCOUNTER — Encounter: Payer: Self-pay | Admitting: Registered Nurse

## 2020-04-05 NOTE — Progress Notes (Signed)
Acute Office Visit  Subjective:    Patient ID: Guy Hutchinson, male    DOB: [birth year], 84 y.o.   MRN: [id number]  Chief Complaint  Patient presents with  . Follow-up    Patient states he is still having problems with right knee. Patient states he was here 10/13 and want to check up  on xray.    HPI Patient is in today for R knee  Has been feeling better but notes knot behind his R knee is not resolving Original injury on 03/10/20, had Xray showing no osseous abnormality but soft tissue swelling and some calcifications in vessels. Not having and dependent edema, redness, heat, or other hallmarks of DVT. Hx of prostate cancer poses some increased risk for DVT but no known hx of clot. No shortness of breath, chest pain, headaches, or sudden cognitive changes  Overall feeling well.   Past Medical History:  Diagnosis Date  . Cataract   . Dizziness 04/11/2017  . Glaucoma   . Prostate cancer [facility])     Past Surgical History:  Procedure Laterality Date  . PROSTATE SURGERY  2007   Impant    Family History  Problem Relation Age of Onset  . Diabetes Mother   . Heart disease Brother     Social History   Socioeconomic History  . Marital status: Divorced    Spouse name: Not on file  . Number of children: 2  . Years of education: 16  . Highest education level: Bachelor's degree (e.g., BA, AB, BS)  Occupational History  . Occupation: Retired  Tobacco Use  . Smoking status: Never Smoker  . Smokeless tobacco: Never Used  Vaping Use  . Vaping Use: Never used  Substance and Sexual Activity  . Alcohol use: Yes    Alcohol/week: 14.0 standard drinks    Types: 14 Cans of beer per week  . Drug use: No  . Sexual activity: Yes  Other Topics Concern  . Not on file  Social History Narrative   Marital status; Divorced.  Dating same male x 2000.      Children:      Lives:  Alone in 2019.      Tobacco: none; in 2019; quit in 1978.      Alcohol:  Drinks 2-3 miller lights  per day.        Exercise:  Plays golf two days per week; walks around in malls.      ADLs; drives; performs ADLs; no assistant devices.      Advanced Directives:  None in 2019.  HCPOA:  Son/[employer].  FULL CODE; no prolonged measures.     Right handed    Social Determinants of Health   Financial Resource Strain:   . Difficulty of Paying Living Expenses: Not on file  Food Insecurity:   . Worried About Charity fundraiser in the Last Year: Not on file  . Ran Out of Food in the Last Year: Not on file  Transportation Needs:   . Lack of Transportation (Medical): Not on file  . Lack of Transportation (Non-Medical): Not on file  Physical Activity:   . Days of Exercise per Week: Not on file  . Minutes of Exercise per Session: Not on file  Stress:   . Feeling of Stress : Not on file  Social Connections:   . Frequency of Communication with Friends and Family: Not on file  . Frequency of Social Gatherings with Friends and Family: Not on file  .  Attends Religious Services: Not on file  . Active Member of Clubs or Organizations: Not on file  . Attends Archivist Meetings: Not on file  . Marital Status: Not on file  Intimate Partner Violence:   . Fear of Current or Ex-Partner: Not on file  . Emotionally Abused: Not on file  . Physically Abused: Not on file  . Sexually Abused: Not on file    Outpatient Medications Prior to Visit  Medication Sig Dispense Refill  . ALFALFA PO Take 1 capsule by mouth every morning.    Marland Kitchen ALPRAZolam (XANAX) 0.5 MG tablet Take 2 tablets approximately 45 minutes prior to the MRI study, take a third tablet if needed. 3 tablet 0  . brimonidine-timolol (COMBIGAN) 0.2-0.5 % ophthalmic solution Place 1 drop into both eyes every 12 (twelve) hours.    Marland Kitchen DANDELION PO Take 1 capsule by mouth every morning.    Marland Kitchen GARLIC PO Take 1 capsule by mouth 3 (three) times daily before meals.    . latanoprost (XALATAN) 0.005 % ophthalmic solution INT 2 GTS IN THE  AFFECTED EYE QHS  5  . MILK THISTLE PO Take 1 capsule by mouth every morning.    . Multiple Vitamin (MULTIVITAMIN WITH MINERALS) TABS Take 1 tablet by mouth daily.    Marland Kitchen OLIVE LEAF PO Take 400 mg every other day by mouth.    Marland Kitchen OVER THE COUNTER MEDICATION Take 1 capsule by mouth every morning. OTC supplement Pyginal (antioxidant)    . OVER THE COUNTER MEDICATION Take 1 capsule by mouth every morning. OTC acai supplement    . OVER THE COUNTER MEDICATION Take 1 capsule by mouth every morning. OTC tumeric supplement    . UNABLE TO FIND Med Name: paudarco    . UNABLE TO FIND Med Name: elderberry    . VALERIAN PO Take 350 mg at bedtime by mouth.     No facility-administered medications prior to visit.    No Known Allergies  Review of Systems  Constitutional: Negative.   HENT: Negative.   Eyes: Negative.   Respiratory: Negative.   Cardiovascular: Negative.   Gastrointestinal: Negative.   Genitourinary: Negative.   Musculoskeletal: Positive for arthralgias (r knee, mild, improving).  Skin: Negative.   Neurological: Negative.   Psychiatric/Behavioral: Negative.        Objective:    Physical Exam Vitals and nursing note reviewed.  Constitutional:      Appearance: Normal appearance.  Cardiovascular:     Rate and Rhythm: Normal rate and regular rhythm.     Heart sounds: Normal heart sounds.  Pulmonary:     Effort: Pulmonary effort is normal. No respiratory distress.     Breath sounds: Normal breath sounds.  Musculoskeletal:        General: Tenderness (mild popliteal R side. some soft swelling. poorly circumscribed. diffuse. may represent cyst/ruptured cyst. do not feel clot is likely.) present. No signs of injury.     Right lower leg: No edema.     Left lower leg: No edema.  Skin:    General: Skin is warm and dry.     Capillary Refill: Capillary refill takes 2 to 3 seconds.     Coloration: Skin is not jaundiced or pale.     Findings: No bruising, erythema, lesion or rash.    Neurological:     General: No focal deficit present.     Mental Status: He is alert and oriented to person, place, and time. Mental status is at baseline.  Psychiatric:        Behavior: Behavior normal.        Thought Content: Thought content normal.        Judgment: Judgment normal.     BP (!) 170/77   Pulse 80   Temp 98.5 F (36.9 C) (Temporal)   Resp 18   Ht 5\' 10"  (1.778 m)   Wt 145 lb 12.8 oz (66.1 kg)   SpO2 98%   BMI 20.92 kg/m  Wt Readings from Last 3 Encounters:  04/02/20 145 lb 12.8 oz (66.1 kg)  03/10/20 144 lb (65.3 kg)  01/15/20 144 lb (65.3 kg)    There are no preventive care reminders to display for this patient.  There are no preventive care reminders to display for this patient.   No results found for: TSH Lab Results  Component Value Date   WBC 6.6 01/15/2020   HGB 14.0 01/15/2020   HCT 39.9 01/15/2020   MCV 98 (H) 01/15/2020   PLT 196 01/15/2020   Lab Results  Component Value Date   NA 133 (L) 01/15/2020   K 4.5 01/15/2020   CO2 20 01/15/2020   GLUCOSE 89 01/15/2020   BUN 12 01/15/2020   CREATININE 0.80 01/15/2020   BILITOT 0.8 01/15/2020   ALKPHOS 82 01/15/2020   AST 19 01/15/2020   ALT 11 01/15/2020   PROT 7.0 01/15/2020   ALBUMIN 4.5 01/15/2020   CALCIUM 9.2 01/15/2020   Lab Results  Component Value Date   CHOL 194 01/15/2020   Lab Results  Component Value Date   HDL 49 01/15/2020   Lab Results  Component Value Date   LDLCALC 125 (H) 01/15/2020   Lab Results  Component Value Date   TRIG 110 01/15/2020   Lab Results  Component Value Date   CHOLHDL 4.0 01/15/2020   Lab Results  Component Value Date   HGBA1C 5.5 01/15/2020       Assessment & Plan:   Problem List Items Addressed This Visit    None    Visit Diagnoses    Knee mass, right    -  Primary   Relevant Orders   US Venous Img Lower Unilateral Right       No orders of the defined types were placed in this encounter.  PLAN  Spent time with  patient reassuring that I do not feel dvt is likely  Getting an ultrasound to rule out clot and to investigate if cyst or other abnormality is present is logical next step  Discussed options with patient who agrees to ultrasound and will have it within next week  Return to clinic if worsening  ER precautions reviewed in depth with patient who voiced understanding  Patient encouraged to call clinic with any questions, comments, or concerns.  I spent 31 minutes with this patient, more than 50% of which was spent counseling and/or educating.   Maximiano Coss, NP

## 2020-04-07 ENCOUNTER — Telehealth: Payer: Self-pay | Admitting: Family Medicine

## 2020-04-07 ENCOUNTER — Other Ambulatory Visit: Payer: Self-pay | Admitting: Registered Nurse

## 2020-04-07 NOTE — Telephone Encounter (Signed)
Guy Hutchinson is calling from [place] and needing clarification on recent referral/ Erica  needing to know if Provider is ruling out DVT/ but. Looks like it may be soft tissue  Guy Hutchinson suggests  Provider could  call radiology line and will clarify what provider needs and will put in Epic / number to radiologist line  Is [phone]  Transferred Guy Hutchinson to [facility]

## 2020-04-08 DIAGNOSIS — H0015 Chalazion left lower eyelid: Secondary | ICD-10-CM | POA: Diagnosis not present

## 2020-04-09 NOTE — Telephone Encounter (Signed)
Pt is checking on status of this message. Please advise at [phone]

## 2020-04-12 NOTE — Telephone Encounter (Signed)
Forwarded to Kathrin Ruddy - ordering provider.

## 2020-04-13 NOTE — Telephone Encounter (Signed)
Rule out DVT and check on Baker's Cyst  Thanks,  Denice Paradise

## 2020-04-14 NOTE — Telephone Encounter (Signed)
I have received a call from [place] from [facility] imaging. She stated that she needed more information about the order. I have read the information from the provider and she stated that the earliest they can get the pt in is 04/26/20 @ 11am.  Is this too far out? Do you want it STAT? If that is the case he may need to be sent to the ED for the imaging.   Since the order is to Rule out DVT and check on the Baker's Cyst.

## 2020-04-14 NOTE — Telephone Encounter (Signed)
Noted  

## 2020-04-14 NOTE — Telephone Encounter (Signed)
[facility] is needing Provider Orland Mustard to add to [employer] , transferred to clinical

## 2020-04-14 NOTE — Telephone Encounter (Signed)
That is fine - 04/26/20 is ok. Pt understands to seek ER care if symptoms progress.  Thank you  Kathrin Ruddy, NP

## 2020-04-26 ENCOUNTER — Ambulatory Visit
Admission: RE | Admit: 2020-04-26 | Discharge: 2020-04-26 | Disposition: A | Discharge status: Home / Self Care | Payer: Medicare Other | Source: Ambulatory Visit | Attending: Registered Nurse | Admitting: Registered Nurse

## 2020-04-26 DIAGNOSIS — M7989 Other specified soft tissue disorders: Secondary | ICD-10-CM | POA: Diagnosis not present

## 2020-04-26 DIAGNOSIS — R2241 Localized swelling, mass and lump, right lower limb: Secondary | ICD-10-CM

## 2020-04-26 NOTE — Progress Notes (Signed)
Dr. Carlota Raspberry - Guy Hutchinson - if we could call patient and reassure him that there is no acute concern for clot, but if this cyst is giving him problems we can refer him to have it looked at, that would be great.  Thanks,  Denice Paradise

## 2020-04-27 ENCOUNTER — Telehealth: Payer: Self-pay | Admitting: *Deleted

## 2020-04-27 ENCOUNTER — Telehealth: Payer: Self-pay | Admitting: Family Medicine

## 2020-04-27 NOTE — Telephone Encounter (Signed)
Pt is returning call for awv

## 2020-04-27 NOTE — Telephone Encounter (Signed)
Patient returned your call to schedule AWV. Please advise at [phone]

## 2020-04-27 NOTE — Telephone Encounter (Signed)
Schedule AWV.  

## 2020-05-07 ENCOUNTER — Telehealth: Payer: Self-pay | Admitting: Registered Nurse

## 2020-05-07 NOTE — Telephone Encounter (Signed)
I have called pt and informed pt of results.   Relayed the message below  "Reassure him that there is no acute concern for clot, but if this cyst is giving him problems we can refer him to have it looked at, that would be great."

## 2020-05-07 NOTE — Telephone Encounter (Signed)
Pt is wanting a cb from [place] concerning the results of an order that was placed on 04/26/20 for US Venous Img Lower Unilateral Right. Please advise at [phone].

## 2020-06-26 DIAGNOSIS — H0015 Chalazion left lower eyelid: Secondary | ICD-10-CM | POA: Diagnosis not present

## 2020-06-26 DIAGNOSIS — H2513 Age-related nuclear cataract, bilateral: Secondary | ICD-10-CM | POA: Diagnosis not present

## 2020-07-20 DIAGNOSIS — H2513 Age-related nuclear cataract, bilateral: Secondary | ICD-10-CM | POA: Diagnosis not present

## 2020-07-20 DIAGNOSIS — H401131 Primary open-angle glaucoma, bilateral, mild stage: Secondary | ICD-10-CM | POA: Diagnosis not present

## 2020-10-07 DIAGNOSIS — R011 Cardiac murmur, unspecified: Secondary | ICD-10-CM | POA: Diagnosis not present

## 2020-10-07 DIAGNOSIS — E7800 Pure hypercholesterolemia, unspecified: Secondary | ICD-10-CM | POA: Diagnosis not present

## 2020-10-07 DIAGNOSIS — R7301 Impaired fasting glucose: Secondary | ICD-10-CM | POA: Diagnosis not present

## 2020-10-07 DIAGNOSIS — T148XXA Other injury of unspecified body region, initial encounter: Secondary | ICD-10-CM | POA: Diagnosis not present

## 2020-10-07 DIAGNOSIS — R030 Elevated blood-pressure reading, without diagnosis of hypertension: Secondary | ICD-10-CM | POA: Diagnosis not present

## 2020-10-07 DIAGNOSIS — C4492 Squamous cell carcinoma of skin, unspecified: Secondary | ICD-10-CM | POA: Diagnosis not present

## 2020-10-21 ENCOUNTER — Ambulatory Visit: Payer: Medicare Other | Admitting: Family Medicine

## 2021-01-11 NOTE — Telephone Encounter (Signed)
Encounter created accidentally. Please disregard.

## 2021-07-31 ENCOUNTER — Emergency Department (HOSPITAL_COMMUNITY): Payer: Medicare Other

## 2021-07-31 ENCOUNTER — Emergency Department (HOSPITAL_COMMUNITY)
Admission: EM | Admit: 2021-07-31 | Discharge: 2021-07-31 | Disposition: A | Discharge status: Home / Self Care | Payer: Medicare Other | Attending: Emergency Medicine | Admitting: Emergency Medicine

## 2021-07-31 DIAGNOSIS — R000 Tachycardia, unspecified: Secondary | ICD-10-CM | POA: Insufficient documentation

## 2021-07-31 DIAGNOSIS — W19XXXA Unspecified fall, initial encounter: Secondary | ICD-10-CM

## 2021-07-31 DIAGNOSIS — S0083XA Contusion of other part of head, initial encounter: Secondary | ICD-10-CM

## 2021-07-31 DIAGNOSIS — Y9301 Activity, walking, marching and hiking: Secondary | ICD-10-CM | POA: Insufficient documentation

## 2021-07-31 DIAGNOSIS — Y92137 Garden or yard on military base as the place of occurrence of the external cause: Secondary | ICD-10-CM | POA: Diagnosis not present

## 2021-07-31 DIAGNOSIS — S0990XA Unspecified injury of head, initial encounter: Secondary | ICD-10-CM | POA: Diagnosis present

## 2021-07-31 DIAGNOSIS — W109XXA Fall (on) (from) unspecified stairs and steps, initial encounter: Secondary | ICD-10-CM | POA: Insufficient documentation

## 2021-07-31 NOTE — ED Triage Notes (Signed)
Pt BIB EMS, per report pt fell down 4 or 5 steps, denies LOC, - thinners. Reports 1 beer tonight ?

## 2021-07-31 NOTE — ED Notes (Signed)
NT ambulated pt in hall way with assistance. No complaints.  ?

## 2021-07-31 NOTE — ED Provider Notes (Signed)
? ?[facility]  ?Provider Note ? ?CSN: [id number] ?Arrival date & time: 07/31/21 1950 ? ?History ?Chief Complaint  ?Patient presents with  ? Fall  ? ? ?Guy Hutchinson is a 86 y.o. male who presents today with headache after a fall.  Patient was trying to walk up 5 steps in his backyard when he tripped and fell.  He hit his head.  Did not lose consciousness.  He is not on blood thinners.  He has no other pain.  He states that he had 1 beer tonight.  Denies any chest pain.  No abdominal pain.  No pain in his extremities.  He has walked since the fall. ? ? ?Home Medications ?Prior to Admission medications   ?Medication Sig Start Date End Date Taking? Authorizing Provider  ?ALFALFA PO Take 1 capsule by mouth every morning.    [provider]  ?ALPRAZolam Duanne Moron) 0.5 MG tablet Take 2 tablets approximately 45 minutes prior to the MRI study, take a third tablet if needed. 04/25/17   Kathrynn Ducking, MD  ?brimonidine-timolol (COMBIGAN) 0.2-0.5 % ophthalmic solution Place 1 drop into both eyes every 12 (twelve) hours.    [provider]  ?DANDELION PO Take 1 capsule by mouth every morning.    [provider]  ?GARLIC PO Take 1 capsule by mouth 3 (three) times daily before meals.    [provider]  ?latanoprost (XALATAN) 0.005 % ophthalmic solution INT 2 GTS IN THE AFFECTED EYE QHS 10/25/17   [provider]  ?MILK THISTLE PO Take 1 capsule by mouth every morning.    [provider]  ?Multiple Vitamin (MULTIVITAMIN WITH MINERALS) TABS Take 1 tablet by mouth daily.    [provider]  ?OLIVE LEAF PO Take 400 mg every other day by mouth.    [provider]  ?OVER THE COUNTER MEDICATION Take 1 capsule by mouth every morning. OTC supplement Pyginal (antioxidant)    [provider]  ?OVER THE COUNTER MEDICATION Take 1 capsule by mouth every morning. OTC acai supplement    [provider]  ?OVER THE  COUNTER MEDICATION Take 1 capsule by mouth every morning. OTC tumeric supplement    [provider]  ?UNABLE TO FIND Med Name: paudarco    [provider]  ?UNABLE TO FIND Med Name: elderberry    [provider]  ?VALERIAN PO Take 350 mg at bedtime by mouth.    [provider]  ? ? ? ?Allergies    ?Patient has no known allergies. ? ? ?Review of Systems   ?Review of Systems  ?Constitutional:  Negative for chills and fever.  ?HENT:  Negative for ear pain and sore throat.   ?Eyes:  Negative for pain and visual disturbance.  ?Respiratory:  Negative for cough and shortness of breath.   ?Cardiovascular:  Negative for chest pain and palpitations.  ?Gastrointestinal:  Negative for abdominal pain and vomiting.  ?Genitourinary:  Negative for dysuria and hematuria.  ?Musculoskeletal:  Negative for arthralgias and back pain.  ?Skin:  Negative for color change and rash.  ?Neurological:  Positive for headaches. Negative for seizures and syncope.  ?All other systems reviewed and are negative. ?Please see HPI for pertinent positives and negatives ? ?Physical Exam ?BP (!) 117/96 (BP Location: Left Arm)   Pulse (!) 104   Temp 98.9 ?F (37.2 ?C) (Oral)   Resp 18   SpO2 96%  ? ?Physical Exam ?Vitals and nursing note reviewed.  ?  Constitutional:   ?   General: He is not in acute distress. ?   Appearance: He is well-developed.  ?HENT:  ?   Head:  ?   Comments: Hematomas present to bilateral forehead.  Skin abrasion to the right forehead.  Tenderness palpation over the forehead. ?Eyes:  ?   Extraocular Movements: Extraocular movements intact.  ?   Conjunctiva/sclera: Conjunctivae normal.  ?   Pupils: Pupils are equal, round, and reactive to light.  ?Cardiovascular:  ?   Rate and Rhythm: Normal rate and regular rhythm.  ?   Heart sounds: No murmur heard. ?Pulmonary:  ?   Effort: Pulmonary effort is normal. No respiratory distress.  ?   Breath sounds: Normal breath sounds.  ?Abdominal:  ?   Palpations:  Abdomen is soft.  ?   Tenderness: There is no abdominal tenderness.  ?Musculoskeletal:     ?   General: No swelling.  ?   Cervical back: Neck supple.  ?Skin: ?   General: Skin is warm and dry.  ?   Capillary Refill: Capillary refill takes less than 2 seconds.  ?Neurological:  ?   General: No focal deficit present.  ?   Mental Status: He is alert and oriented to person, place, and time.  ?   Cranial Nerves: No cranial nerve deficit.  ?   Sensory: No sensory deficit.  ?   Motor: No weakness.  ?   Coordination: Coordination normal.  ?   Gait: Gait normal.  ?Psychiatric:     ?   Mood and Affect: Mood normal.  ? ? ?ED Results / Procedures / Treatments   ?EKG ?EKG Interpretation ? ?Date/Time:  Sunday July 31 2021 20:03:29 EST ?Ventricular Rate:  102 ?PR Interval:  153 ?QRS Duration: 113 ?QT Interval:  353 ?QTC Calculation: 460 ?R Axis:   -15 ?Text Interpretation: Sinus tachycardia Borderline intraventricular conduction delay Abnormal R-wave progression, late transition Repol abnrm suggests ischemia, lateral leads No significant change since last tracing Confirmed by Deno Etienne [phone]) on 07/31/2021 8:09:42 PM ? ?Procedures ?Procedures ? ?Medications Ordered in the ED ?Medications - No data to display ? ? ?ED Course  ? ?  ? ? ?MDM  ? ?This patient presents to the ED for concern of a fall, this involves an extensive number of treatment options, and is a complaint that carries with it a high risk of complications and morbidity.  The differential diagnosis includes traumatic injuries including intracranial hemorrhage, fractures, dislocations. ? ? ?Additional history obtained: ?Additional history obtained from family ?Records reviewed previous admission documents and Primary Care Documents ? ?Imaging Studies ordered: ?I ordered imaging studies including CT scan head and neck and X-ray chest and pelvis   ?I independently visualized and interpreted imaging which showed no acute traumatic injuries ?I agree with the radiologist  interpretation ? ?EKG (personally reviewed and interpreted): No STEMI.  No obvious arrhythmias. ? ?Medical Decision Making: Patient presented after a mechanical fall.  He hit his head.  No loss of consciousness.  CT imaging was ordered for his head and neck.  This was unremarkable.  No intracranial pathology.  Patient has no open wounds requiring repair.  His skin abrasion was dressed with bacitracin and gauze.  He was ambulatory.  He was at baseline.  His vitals were stable and within normal limits while here.  I do not feel he requires further evaluation or treatment at this time.  Discussed with family and patient, they are comfortable with plan for discharge home.  Plan for outpatient follow-up with PCP. ? ?Complexity of problems addressed: ?Patient?s presentation is most consistent with  acute presentation with potential threat to life or bodily function ? ?Disposition: ?After consideration of the diagnostic results and the patient?s response to treatment,  ?I feel that the patent would benefit from discharge home .  ? ?Patient seen in conjunction with my attending, Dr. Tyrone Nine. ? ? ? ?Final Clinical Impression(s) / ED Diagnoses ?Final diagnoses:  ?Fall, initial encounter  ?Contusion of face, initial encounter  ? ? ?Rx / DC Orders ?ED Discharge Orders   ? ? None  ? ?  ? ? ?  ?Jacelyn Pi, MD ?07/31/21 2304 ? ?  ?Deno Etienne, DO ?07/31/21 2309 ? ?

## 2021-08-10 ENCOUNTER — Telehealth: Payer: Self-pay

## 2021-08-10 NOTE — Telephone Encounter (Signed)
Caller name:Zaydyn Steen Sherolyn Buba daughter  ? ?On DPR? :Yes ? ?Call back [phone] ? ?Provider they see: Carlota Raspberry  ? ?Reason for call:Pt fell last week went to ER pt was released and fell again day after pt daughter made appt but is unable to come down the stairs to get check what advice can you give to get pt checked  ? ?

## 2021-08-10 NOTE — Telephone Encounter (Signed)
LM to discuss virtual appt considering fall and trouble ambulating  ?

## 2021-08-11 ENCOUNTER — Emergency Department (HOSPITAL_COMMUNITY): Payer: Medicare Other

## 2021-08-11 ENCOUNTER — Encounter: Payer: Self-pay | Admitting: Family Medicine

## 2021-08-11 ENCOUNTER — Ambulatory Visit: Payer: Medicare Other | Admitting: Family Medicine

## 2021-08-11 ENCOUNTER — Encounter (HOSPITAL_COMMUNITY): Payer: Self-pay | Admitting: Emergency Medicine

## 2021-08-11 ENCOUNTER — Emergency Department (HOSPITAL_BASED_OUTPATIENT_CLINIC_OR_DEPARTMENT_OTHER): Payer: Medicare Other

## 2021-08-11 ENCOUNTER — Encounter (HOSPITAL_COMMUNITY): Payer: Medicare Other

## 2021-08-11 ENCOUNTER — Inpatient Hospital Stay (HOSPITAL_COMMUNITY)
Admission: EM | Admit: 2021-08-11 | Discharge: 2021-08-16 | DRG: 308 | Disposition: A | Discharge status: Skilled Nursing Facility | Payer: Medicare Other | Attending: Family Medicine | Admitting: Family Medicine

## 2021-08-11 ENCOUNTER — Telehealth (INDEPENDENT_AMBULATORY_CARE_PROVIDER_SITE_OTHER): Payer: Medicare Other | Admitting: Family Medicine

## 2021-08-11 ENCOUNTER — Other Ambulatory Visit: Payer: Self-pay

## 2021-08-11 DIAGNOSIS — Z9181 History of falling: Secondary | ICD-10-CM

## 2021-08-11 DIAGNOSIS — N179 Acute kidney failure, unspecified: Secondary | ICD-10-CM

## 2021-08-11 DIAGNOSIS — Z20822 Contact with and (suspected) exposure to covid-19: Secondary | ICD-10-CM | POA: Diagnosis present

## 2021-08-11 DIAGNOSIS — W19XXXD Unspecified fall, subsequent encounter: Secondary | ICD-10-CM | POA: Diagnosis not present

## 2021-08-11 DIAGNOSIS — E876 Hypokalemia: Secondary | ICD-10-CM

## 2021-08-11 DIAGNOSIS — R778 Other specified abnormalities of plasma proteins: Secondary | ICD-10-CM

## 2021-08-11 DIAGNOSIS — I480 Paroxysmal atrial fibrillation: Principal | ICD-10-CM | POA: Diagnosis present

## 2021-08-11 DIAGNOSIS — Z833 Family history of diabetes mellitus: Secondary | ICD-10-CM

## 2021-08-11 DIAGNOSIS — M79604 Pain in right leg: Secondary | ICD-10-CM

## 2021-08-11 DIAGNOSIS — R17 Unspecified jaundice: Secondary | ICD-10-CM | POA: Diagnosis present

## 2021-08-11 DIAGNOSIS — F1090 Alcohol use, unspecified, uncomplicated: Secondary | ICD-10-CM

## 2021-08-11 DIAGNOSIS — I4891 Unspecified atrial fibrillation: Secondary | ICD-10-CM | POA: Diagnosis not present

## 2021-08-11 DIAGNOSIS — R609 Edema, unspecified: Secondary | ICD-10-CM

## 2021-08-11 DIAGNOSIS — E7800 Pure hypercholesterolemia, unspecified: Secondary | ICD-10-CM | POA: Diagnosis present

## 2021-08-11 DIAGNOSIS — Z66 Do not resuscitate: Secondary | ICD-10-CM | POA: Diagnosis present

## 2021-08-11 DIAGNOSIS — I359 Nonrheumatic aortic valve disorder, unspecified: Secondary | ICD-10-CM

## 2021-08-11 DIAGNOSIS — I5031 Acute diastolic (congestive) heart failure: Secondary | ICD-10-CM | POA: Insufficient documentation

## 2021-08-11 DIAGNOSIS — E8770 Fluid overload, unspecified: Secondary | ICD-10-CM | POA: Diagnosis not present

## 2021-08-11 DIAGNOSIS — R0609 Other forms of dyspnea: Secondary | ICD-10-CM | POA: Diagnosis not present

## 2021-08-11 DIAGNOSIS — E871 Hypo-osmolality and hyponatremia: Secondary | ICD-10-CM

## 2021-08-11 DIAGNOSIS — Z8249 Family history of ischemic heart disease and other diseases of the circulatory system: Secondary | ICD-10-CM

## 2021-08-11 DIAGNOSIS — Z8546 Personal history of malignant neoplasm of prostate: Secondary | ICD-10-CM

## 2021-08-11 DIAGNOSIS — Z79899 Other long term (current) drug therapy: Secondary | ICD-10-CM

## 2021-08-11 DIAGNOSIS — Y92009 Unspecified place in unspecified non-institutional (private) residence as the place of occurrence of the external cause: Secondary | ICD-10-CM

## 2021-08-11 DIAGNOSIS — I509 Heart failure, unspecified: Secondary | ICD-10-CM

## 2021-08-11 DIAGNOSIS — Z789 Other specified health status: Secondary | ICD-10-CM

## 2021-08-11 DIAGNOSIS — S81801A Unspecified open wound, right lower leg, initial encounter: Secondary | ICD-10-CM | POA: Diagnosis not present

## 2021-08-11 DIAGNOSIS — R7989 Other specified abnormal findings of blood chemistry: Secondary | ICD-10-CM

## 2021-08-11 DIAGNOSIS — I352 Nonrheumatic aortic (valve) stenosis with insufficiency: Secondary | ICD-10-CM | POA: Diagnosis present

## 2021-08-11 DIAGNOSIS — T502X5A Adverse effect of carbonic-anhydrase inhibitors, benzothiadiazides and other diuretics, initial encounter: Secondary | ICD-10-CM | POA: Diagnosis present

## 2021-08-11 DIAGNOSIS — L8990 Pressure ulcer of unspecified site, unspecified stage: Secondary | ICD-10-CM | POA: Insufficient documentation

## 2021-08-11 DIAGNOSIS — R600 Localized edema: Secondary | ICD-10-CM

## 2021-08-11 DIAGNOSIS — M7121 Synovial cyst of popliteal space [Baker], right knee: Secondary | ICD-10-CM

## 2021-08-11 DIAGNOSIS — Z85828 Personal history of other malignant neoplasm of skin: Secondary | ICD-10-CM

## 2021-08-11 DIAGNOSIS — M7989 Other specified soft tissue disorders: Secondary | ICD-10-CM | POA: Diagnosis not present

## 2021-08-11 DIAGNOSIS — D72828 Other elevated white blood cell count: Secondary | ICD-10-CM | POA: Diagnosis present

## 2021-08-11 LAB — COMPREHENSIVE METABOLIC PANEL
ALT: 28 U/L (ref 0–44)
AST: 35 U/L (ref 15–41)
Albumin: 3.5 g/dL (ref 3.5–5.0)
Alkaline Phosphatase: 96 U/L (ref 38–126)
Anion gap: 9 (ref 5–15)
BUN: 21 mg/dL (ref 8–23)
CO2: 21 mmol/L — ABNORMAL LOW (ref 22–32)
Calcium: 8.4 mg/dL — ABNORMAL LOW (ref 8.9–10.3)
Chloride: 98 mmol/L (ref 98–111)
Creatinine, Ser: 1.18 mg/dL (ref 0.61–1.24)
GFR, Estimated: >60 mL/min (ref >60)
Glucose, Bld: 130 mg/dL — ABNORMAL HIGH (ref 70–99)
Potassium: 3.9 mmol/L (ref 3.5–5.1)
Sodium: 128 mmol/L — ABNORMAL LOW (ref 135–145)
Total Bilirubin: 2.2 mg/dL — ABNORMAL HIGH (ref 0.3–1.2)
Total Protein: 6.5 g/dL (ref 6.5–8.1)

## 2021-08-11 LAB — CBC WITH DIFFERENTIAL/PLATELET
Abs Immature Granulocytes: 0.18 10*3/uL — ABNORMAL HIGH (ref 0.00–0.07)
Basophils Absolute: 0 10*3/uL (ref 0.0–0.1)
Basophils Relative: 0 %
Eosinophils Absolute: 0.1 10*3/uL (ref 0.0–0.5)
Eosinophils Relative: 1 %
HCT: 33 % — ABNORMAL LOW (ref 39.0–52.0)
Hemoglobin: 11 g/dL — ABNORMAL LOW (ref 13.0–17.0)
Immature Granulocytes: 2 %
Lymphocytes Relative: 17 %
Lymphs Abs: 1.7 10*3/uL (ref 0.7–4.0)
MCH: 32.4 pg (ref 26.0–34.0)
MCHC: 33.3 g/dL (ref 30.0–36.0)
MCV: 97.1 fL (ref 80.0–100.0)
Monocytes Absolute: 0.8 10*3/uL (ref 0.1–1.0)
Monocytes Relative: 8 %
Neutro Abs: 7.3 10*3/uL (ref 1.7–7.7)
Neutrophils Relative %: 72 %
Platelets: 309 10*3/uL (ref 150–400)
RBC: 3.4 MIL/uL — ABNORMAL LOW (ref 4.22–5.81)
RDW: 14.7 % (ref 11.5–15.5)
WBC: 9.9 10*3/uL (ref 4.0–10.5)
nRBC: 0 % (ref 0.0–0.2)

## 2021-08-11 LAB — MAGNESIUM: Magnesium: 2.5 mg/dL — ABNORMAL HIGH (ref 1.7–2.4)

## 2021-08-11 LAB — CK: Total CK: 132 U/L (ref 49–397)

## 2021-08-11 LAB — TROPONIN I (HIGH SENSITIVITY)
Troponin I (High Sensitivity): 255 ng/L (ref <18)
Troponin I (High Sensitivity): 262 ng/L (ref <18)

## 2021-08-11 LAB — TSH: TSH: 1.022 u[IU]/mL (ref 0.350–4.500)

## 2021-08-11 LAB — PROTIME-INR
INR: 1.3 — ABNORMAL HIGH (ref 0.8–1.2)
Prothrombin Time: 15.8 seconds — ABNORMAL HIGH (ref 11.4–15.2)

## 2021-08-11 LAB — D-DIMER, QUANTITATIVE: D-Dimer, Quant: 3.91 ug/mL-FEU — ABNORMAL HIGH (ref 0.00–0.50)

## 2021-08-11 LAB — T4, FREE: Free T4: 1.54 ng/dL — ABNORMAL HIGH (ref 0.61–1.12)

## 2021-08-11 LAB — BRAIN NATRIURETIC PEPTIDE: B Natriuretic Peptide: 1049.1 pg/mL — ABNORMAL HIGH (ref 0.0–100.0)

## 2021-08-11 MED ORDER — SODIUM CHLORIDE 0.9% FLUSH: 3.0000 mL | INTRAVENOUS | Status: DC | PRN

## 2021-08-11 MED ORDER — METOPROLOL TARTRATE 25 MG PO TABS
12.5000 mg | ORAL_TABLET | Freq: Two times a day (BID) | ORAL | Status: DC
Start: 2021-08-11 — End: 2021-08-11
  Administered 2021-08-11: 12.5 mg via ORAL
  Filled 2021-08-11: qty 1

## 2021-08-11 MED ORDER — ENOXAPARIN SODIUM 40 MG/0.4ML IJ SOSY
40.0000 mg | PREFILLED_SYRINGE | INTRAMUSCULAR | Status: DC
  Administered 2021-08-11: 40 mg via SUBCUTANEOUS
  Filled 2021-08-11: qty 0.4

## 2021-08-11 MED ORDER — SODIUM CHLORIDE 0.9% FLUSH
3.0000 mL | Freq: Two times a day (BID) | INTRAVENOUS | Status: DC
  Administered 2021-08-11 – 2021-08-15 (×8): 3 mL via INTRAVENOUS

## 2021-08-11 MED ORDER — FUROSEMIDE 10 MG/ML IJ SOLN: 40.0000 mg | Freq: Once | INTRAMUSCULAR | Status: DC

## 2021-08-11 MED ORDER — LATANOPROST 0.005 % OP SOLN
1.0000 [drp] | Freq: Every day | OPHTHALMIC | Status: DC
  Administered 2021-08-11 – 2021-08-15 (×5): 1 [drp] via OPHTHALMIC
  Filled 2021-08-11: qty 2.5

## 2021-08-11 MED ORDER — ADULT MULTIVITAMIN W/MINERALS CH
1.0000 | ORAL_TABLET | Freq: Every day | ORAL | Status: DC
  Administered 2021-08-11 – 2021-08-16 (×6): 1 via ORAL
  Filled 2021-08-11 (×6): qty 1

## 2021-08-11 MED ORDER — FUROSEMIDE 10 MG/ML IJ SOLN
60.0000 mg | Freq: Once | INTRAMUSCULAR | Status: AC
  Administered 2021-08-11: 60 mg via INTRAVENOUS
  Filled 2021-08-11: qty 6

## 2021-08-11 MED ORDER — THIAMINE HCL 100 MG PO TABS
100.0000 mg | ORAL_TABLET | Freq: Every day | ORAL | Status: DC
  Administered 2021-08-11 – 2021-08-16 (×6): 100 mg via ORAL
  Filled 2021-08-11 (×6): qty 1

## 2021-08-11 MED ORDER — LORAZEPAM 2 MG/ML IJ SOLN: 1.0000 mg | INTRAMUSCULAR | Status: DC | PRN

## 2021-08-11 MED ORDER — FOLIC ACID 1 MG PO TABS
1.0000 mg | ORAL_TABLET | Freq: Every day | ORAL | Status: DC
  Administered 2021-08-11 – 2021-08-16 (×6): 1 mg via ORAL
  Filled 2021-08-11 (×6): qty 1

## 2021-08-11 MED ORDER — ACETAMINOPHEN 650 MG RE SUPP: 650.0000 mg | Freq: Four times a day (QID) | RECTAL | Status: DC | PRN

## 2021-08-11 MED ORDER — SODIUM CHLORIDE 0.9 % IV SOLN: 250.0000 mL | INTRAVENOUS | Status: DC | PRN

## 2021-08-11 MED ORDER — DORZOLAMIDE HCL-TIMOLOL MAL 2-0.5 % OP SOLN
1.0000 [drp] | Freq: Two times a day (BID) | OPHTHALMIC | Status: DC
  Administered 2021-08-12 – 2021-08-16 (×7): 1 [drp] via OPHTHALMIC
  Filled 2021-08-11: qty 10

## 2021-08-11 MED ORDER — METOPROLOL TARTRATE 25 MG PO TABS: 12.5000 mg | ORAL_TABLET | Freq: Two times a day (BID) | ORAL | Status: DC

## 2021-08-11 MED ORDER — THIAMINE HCL 100 MG/ML IJ SOLN
100.0000 mg | Freq: Every day | INTRAMUSCULAR | Status: DC
  Filled 2021-08-11: qty 2

## 2021-08-11 MED ORDER — IOHEXOL 350 MG/ML SOLN
65.0000 mL | Freq: Once | INTRAVENOUS | Status: AC | PRN
  Administered 2021-08-11: 65 mL via INTRAVENOUS

## 2021-08-11 MED ORDER — MELATONIN 5 MG PO TABS
5.0000 mg | ORAL_TABLET | Freq: Every evening | ORAL | Status: DC | PRN
Start: 2021-08-11 — End: 2021-08-16
  Administered 2021-08-11 – 2021-08-15 (×5): 5 mg via ORAL
  Filled 2021-08-11 (×5): qty 1

## 2021-08-11 MED ORDER — LORAZEPAM 1 MG PO TABS: 1.0000 mg | ORAL_TABLET | ORAL | Status: DC | PRN

## 2021-08-11 MED ORDER — ACETAMINOPHEN 325 MG PO TABS
650.0000 mg | ORAL_TABLET | Freq: Four times a day (QID) | ORAL | Status: DC | PRN
  Filled 2021-08-11 (×2): qty 2

## 2021-08-11 MED ORDER — METOPROLOL TARTRATE 25 MG PO TABS
25.0000 mg | ORAL_TABLET | Freq: Four times a day (QID) | ORAL | Status: DC
Start: 2021-08-12 — End: 2021-08-14
  Administered 2021-08-11: 12.5 mg via ORAL
  Administered 2021-08-12 – 2021-08-14 (×9): 25 mg via ORAL
  Filled 2021-08-11 (×10): qty 1

## 2021-08-11 NOTE — Assessment & Plan Note (Addendum)
New diagnosis.  ?- Continue metoprolol, converted to succinate '75mg'$  daily.  ?- Continue cardiac monitoring.  ?- Risk of fall with severe bleeding on anticoagulation appears to exceed the relative risk reduction in stroke for this patient long term. Both medical and cardiology teams have discussed with patient/family and we will not continue this.   ?- Maintain K and Mg levels ?

## 2021-08-11 NOTE — Assessment & Plan Note (Addendum)
LDL 132, HDL 23.  ?- Given extensive coronary calcifications, would qualify for statin. LFTs wnl. ?

## 2021-08-11 NOTE — Assessment & Plan Note (Addendum)
BNP >1000, echo pending. Edema is likely multifactorial inclusive of venous stasis based on hyperpigmentation (in addition to bruising). No DVT on U/S.  ?- WOC consulted for weeping wounds. ?

## 2021-08-11 NOTE — ED Notes (Signed)
Patient transported to Ultrasound 

## 2021-08-11 NOTE — Assessment & Plan Note (Addendum)
Troponin 255>265 without chest pain or ischemic ST changes on ECG. Pt has extensive coronary calcifications on CT, but presentation is not consistent with ACS at this time.  ?- Cardiology not currently planning intervention/work up as inpatient. ?- Continue BB, statin, aspirin ?

## 2021-08-11 NOTE — ED Notes (Signed)
Dr. Ronnald Nian aware of Trop 255 ?

## 2021-08-11 NOTE — ED Provider Notes (Signed)
?[facility] ?Provider Note ? ? ?CSN: [id number] ?Arrival date & time: 08/11/21  1456 ? ?  ? ?History ? ?Chief Complaint  ?Patient presents with  ? Shortness of Breath  ? ? ?Guy Hutchinson is a 86 y.o. male. ? ?Patient here with shortness of breath, leg swelling.  Patient had mechanical fall about 10 days ago and has had extensive bruising to the face and lower legs since that fall.  Has become increasingly short of breath with dry cough.  Has had weeping from both of his legs.  Denies any chest pain. ? ?The history is provided by the patient and a caregiver.  ?Shortness of Breath ?Severity:  Moderate ?Onset quality:  Gradual ?Duration:  5 days ?Timing:  Constant ?Progression:  Waxing and waning ?Chronicity:  New ?Context: activity   ?Relieved by:  Nothing ?Worsened by:  Exertion ?Associated symptoms: cough   ?Associated symptoms: no abdominal pain, no chest pain, no claudication, no diaphoresis, no ear pain, no fever, no headaches, no hemoptysis, no neck pain, no PND, no rash, no sore throat, no sputum production, no syncope, no swollen glands, no vomiting and no wheezing   ?Risk factors: no hx of PE/DVT   ? ?  ? ?Home Medications ?Prior to Admission medications   ?Medication Sig Start Date End Date Taking? Authorizing Provider  ?ALFALFA PO Take 1 capsule by mouth every morning.    [provider]  ?ALPRAZolam Duanne Moron) 0.5 MG tablet Take 2 tablets approximately 45 minutes prior to the MRI study, take a third tablet if needed. ?Patient not taking: Reported on 08/11/2021 04/25/17   Kathrynn Ducking, MD  ?brimonidine-timolol (COMBIGAN) 0.2-0.5 % ophthalmic solution Place 1 drop into both eyes every 12 (twelve) hours.    [provider]  ?DANDELION PO Take 1 capsule by mouth every morning.    [provider]  ?GARLIC PO Take 1 capsule by mouth 3 (three) times daily before meals.    [provider]  ?latanoprost (XALATAN) 0.005 % ophthalmic  solution INT 2 GTS IN THE AFFECTED EYE QHS 10/25/17   [provider]  ?MILK THISTLE PO Take 1 capsule by mouth every morning.    [provider]  ?Multiple Vitamin (MULTIVITAMIN WITH MINERALS) TABS Take 1 tablet by mouth daily.    [provider]  ?OLIVE LEAF PO Take 400 mg every other day by mouth.    [provider]  ?OVER THE COUNTER MEDICATION Take 1 capsule by mouth every morning. OTC supplement Pyginal (antioxidant)    [provider]  ?OVER THE COUNTER MEDICATION Take 1 capsule by mouth every morning. OTC acai supplement    [provider]  ?OVER THE COUNTER MEDICATION Take 1 capsule by mouth every morning. OTC tumeric supplement    [provider]  ?UNABLE TO FIND Med Name: paudarco    [provider]  ?UNABLE TO FIND Med Name: elderberry    [provider]  ?VALERIAN PO Take 350 mg at bedtime by mouth. ?Patient not taking: Reported on 08/11/2021    [provider]  ?   ? ?Allergies    ?Patient has no known allergies.   ? ?Review of Systems   ?Review of Systems  ?Constitutional:  Negative for diaphoresis and fever.  ?HENT:  Negative for ear pain and sore throat.   ?Respiratory:  Positive for cough and shortness of breath. Negative for hemoptysis, sputum production and wheezing.   ?Cardiovascular:  Negative for chest pain,  claudication, syncope and PND.  ?Gastrointestinal:  Negative for abdominal pain and vomiting.  ?Musculoskeletal:  Negative for neck pain.  ?Skin:  Negative for rash.  ?Neurological:  Negative for headaches.  ? ?Physical Exam ?Updated Vital Signs ?BP 124/74   Pulse (!) 105   Temp 98.1 ?F (36.7 ?C) (Oral)   Resp 16   Ht '5\' 10"'$  (1.778 m)   Wt 76.2 kg   SpO2 94%   BMI 24.11 kg/m?  ?Physical Exam ?Vitals and nursing note reviewed.  ?Constitutional:   ?   General: He is not in acute distress. ?   Appearance: He is well-developed.  ?HENT:  ?   Head: Normocephalic and atraumatic.  ?Eyes:  ?   Extraocular  Movements: Extraocular movements intact.  ?   Conjunctiva/sclera: Conjunctivae normal.  ?   Pupils: Pupils are equal, round, and reactive to light.  ?Cardiovascular:  ?   Rate and Rhythm: Tachycardia present. Rhythm irregular.  ?   Heart sounds: Murmur heard.  ?Pulmonary:  ?   Effort: Pulmonary effort is normal. No respiratory distress.  ?   Breath sounds: Decreased breath sounds present.  ?Abdominal:  ?   Palpations: Abdomen is soft.  ?   Tenderness: There is no abdominal tenderness.  ?Musculoskeletal:     ?   General: No swelling.  ?   Cervical back: Normal range of motion and neck supple.  ?   Right lower leg: Edema present.  ?   Left lower leg: Edema present.  ?   Comments: 2+ pitting edema bilaterally with weeping skin  ?Skin: ?   General: Skin is warm and dry.  ?   Capillary Refill: Capillary refill takes less than 2 seconds.  ?   Findings: Ecchymosis present.  ?   Comments: Significant bruising to the face, bilateral shins  ?Neurological:  ?   General: No focal deficit present.  ?   Mental Status: He is alert and oriented to person, place, and time.  ?   Cranial Nerves: No cranial nerve deficit.  ?   Motor: No weakness.  ?Psychiatric:     ?   Mood and Affect: Mood normal.  ? ? ?ED Results / Procedures / Treatments   ?Labs ?(all labs ordered are listed, but only abnormal results are displayed) ?Labs Reviewed  ?CBC WITH DIFFERENTIAL/PLATELET - Abnormal; Notable for the following components:  ?    Result Value  ? RBC 3.40 (*)   ? Hemoglobin 11.0 (*)   ? HCT 33.0 (*)   ? Abs Immature Granulocytes 0.18 (*)   ? All other components within normal limits  ?COMPREHENSIVE METABOLIC PANEL - Abnormal; Notable for the following components:  ? Sodium 128 (*)   ? CO2 21 (*)   ? Glucose, Bld 130 (*)   ? Calcium 8.4 (*)   ? Total Bilirubin 2.2 (*)   ? All other components within normal limits  ?MAGNESIUM - Abnormal; Notable for the following components:  ? Magnesium 2.5 (*)   ? All other components within normal limits   ?BRAIN NATRIURETIC PEPTIDE - Abnormal; Notable for the following components:  ? B Natriuretic Peptide 1,049.1 (*)   ? All other components within normal limits  ?D-DIMER, QUANTITATIVE - Abnormal; Notable for the following components:  ? D-Dimer, Quant 3.91 (*)   ? All other components within normal limits  ?TROPONIN I (HIGH SENSITIVITY) - Abnormal; Notable for the following components:  ? Troponin I (High Sensitivity) 255 (*)   ? All other  components within normal limits  ?CK  ?URINALYSIS, ROUTINE W REFLEX MICROSCOPIC  ?TSH  ?T4, FREE  ?TROPONIN I (HIGH SENSITIVITY)  ? ? ?EKG ?EKG Interpretation ? ?Date/Time:  Thursday August 11 2021 16:37:44 EDT ?Ventricular Rate:  108 ?PR Interval:    ?QRS Duration: 118 ?QT Interval:  341 ?QTC Calculation: 457 ?R Axis:   -20 ?Text Interpretation: Atrial fibrillation Incomplete RBBB and LAFB Abnormal T, consider ischemia, lateral leads Confirmed by Ronnald Nian, Cleatus Gabriel ([id number]) on 08/11/2021 4:42:22 PM ? ?Radiology ?DG Chest 2 View ? ?Result Date: 08/11/2021 ?CLINICAL DATA:  Shortness of breath. EXAM: CHEST - 2 VIEW COMPARISON:  Chest x-ray 07/31/2021. FINDINGS: There is a vague focal nodular density in the right upper lobe measuring 2 cm overlying the anterior right second rib lungs are otherwise clear. Cardiomediastinal silhouette is stable and within normal limits. No pleural effusion or pneumothorax. No acute fractures. IMPRESSION: 1. No evidence for pneumonia or edema. 2. Questionable nodular density in the right lung apex. Recommend follow-up nonemergent chest CT. Electronically Signed   By: Ronney Asters M.D.   On: 08/11/2021 15:42  ? ?CT HEAD WO CONTRAST (5MM) ? ?Result Date: 08/11/2021 ?CLINICAL DATA:  Weak; Head trauma, moderate-severe fall EXAM: CT HEAD WITHOUT CONTRAST CT MAXILLOFACIAL WITHOUT CONTRAST TECHNIQUE: Multidetector CT imaging of the head and maxillofacial structures were performed using the standard protocol without intravenous contrast. Multiplanar CT image  reconstructions of the maxillofacial structures were also generated. RADIATION DOSE REDUCTION: This exam was performed according to the departmental dose-optimization program which includes automated exposure control, adjustment

## 2021-08-11 NOTE — ED Provider Triage Note (Signed)
Emergency Medicine Provider Triage Evaluation Note ? ?Guy Hutchinson , a 86 y.o. male  was evaluated in triage.  Patient presents today for evaluation of peripheral edema, dyspnea on exertion.  He denies chest pain or palpitations.  Patient was recently evaluated following a fall.  Imaging at that time was reassuring.  Patient had a video visit with his primary care provider who recommended he come into the emergency room for further evaluation.  He denies fever, chills.  Patient on exam appears to have urinated on himself.  Pants are wet.  Unable to state why. ? ?Review of Systems  ?Positive: As above ?Negative: As above ? ?Physical Exam  ?BP (!) 130/98 (BP Location: Right Arm)   Pulse 92   Temp 98.1 ?F (36.7 ?C) (Oral)   Resp 18   SpO2 100%  ?Gen:   Awake, no distress   ?Resp:  Normal effort.  Lung sounds clear to auscultation. ?MSK:   Moves extremities without difficulty  ?Other:  Irregular heart rhythm.  Bilateral pitting edema.  Abdomen nondistended and nontender. ? ?Medical Decision Making  ?Medically screening exam initiated at 3:14 PM.  Appropriate orders placed.  Guy Hutchinson was informed that the remainder of the evaluation will be completed by another provider, this initial triage assessment does not replace that evaluation, and the importance of remaining in the ED until their evaluation is complete. ? ? ?  ?Evlyn Courier, PA-C ?08/11/21 1516 ? ?

## 2021-08-11 NOTE — Assessment & Plan Note (Addendum)
Daily use, unclear quantity. No evidence of withdrawal during hospitalization, stopped CIWA. ?

## 2021-08-11 NOTE — Progress Notes (Signed)
Virtual Visit via Video Note ?47mn chart review/ERnote review.  ?Caregility connection 2 times - unable to hear or see me, video froze on 2nd attempt. Not available on 3rd attempt. Called on phone, then son's phone.  ?I connected with Guy Hutchinson 08/11/21 at 12:18 PM by a video enabled telemedicine application and verified that I am speaking with the correct person using two identifiers.  ?Patient location:home with son Guy Hutchinson  ?[place]  ?  ?I discussed the limitations, risks, security and privacy concerns of performing an evaluation and management service by telephone and the availability of in person appointments. I also discussed with the patient that there may be a patient responsible charge related to this service. The patient expressed understanding and agreed to proceed, consent obtained ? ?Chief complaint: ? ?Chief Complaint  ?Patient presents with  ? Follow-up  ?  Fall was seen in ED, fell down the steps at his house, he felt dizzy and his foot slipped and fell  went to the ED. Has fallen 3 times over a period of 3 years  before ED visit he currently has swollen right leg and has fluid coming out around his ankle  ? ? ?History of Present Illness: ?Guy MASCIOis a 86y.o. male ? ? ?Fall at home ?Follow-up from emergency room visit. ?DOI 07/31/21. ?ER evaluation 07/31/2021. ?Per ER history he was walking up 5 steps in his backyard, tripped and fell, hit his head, no LOC.  1 beer prior.  Abrasion to right knee but no other extremity pain.  CT head and C-spine without acute injury.  Moderate to severe degenerative changes in cervical spine without evidence of acute fracture.  No acute intracranial hemorrhage.  Atrophy with chronic microvascular ischemic changes.  EKG without significant change since previous tracing. ?Since ER visit -  ?Bruising of R greater than L legs with some fluid leakage in R lower leg past 4 days. R calf swollen and bruised. No chest  pain. Dyspnea with walking to bathroom since his fall. Some cough few days ago. Clear phlegm.  ?Able to walk with walker but some pain in R leg. No hip pain. ?Taking Arnica tablets for bruising. Multiple herbal meds. Does take ASA 81 mg qd.  ?No headaches. bruising in face - appears to be getting better per son.  ? ?Patient Active Problem List  ? Diagnosis Date Noted  ? Squamous cell carcinoma of scalp 10/19/2017  ? Dizziness 04/11/2017  ? History of prostate cancer 07/21/2016  ? Hearing impaired 10/22/2014  ? ?Past Medical History:  ?Diagnosis Date  ? Cataract   ? Dizziness 04/11/2017  ? Glaucoma   ? [place]   ? ?Past Surgical History:  ?Procedure Laterality Date  ? PROSTATE SURGERY  2007  ? Impant  ? ?No Known Allergies ?Prior to Admission medications   ?Medication Sig Start Date End Date Taking? Authorizing Provider  ?ALFALFA PO Take 1 capsule by mouth every morning.   Yes [provider]  ?brimonidine-timolol (COMBIGAN) 0.2-0.5 % ophthalmic solution Place 1 drop into both eyes every 12 (twelve) hours.   Yes [provider]  ?DANDELION PO Take 1 capsule by mouth every morning.   Yes [provider]  ?GARLIC PO Take 1 capsule by mouth 3 (three) times daily before meals.   Yes [provider]  ?latanoprost (XALATAN) 0.005 % ophthalmic solution INT 2 GTS IN THE AFFECTED EYE QHS 10/25/17  Yes [provider]  ?  MILK THISTLE PO Take 1 capsule by mouth every morning.   Yes [provider]  ?Multiple Vitamin (MULTIVITAMIN WITH MINERALS) TABS Take 1 tablet by mouth daily.   Yes [provider]  ?OLIVE LEAF PO Take 400 mg every other day by mouth.   Yes [provider]  ?OVER THE COUNTER MEDICATION Take 1 capsule by mouth every morning. OTC supplement Pyginal (antioxidant)   Yes [provider]  ?OVER THE COUNTER MEDICATION Take 1 capsule by mouth every morning. OTC acai supplement   Yes [provider]  ?OVER THE COUNTER  MEDICATION Take 1 capsule by mouth every morning. OTC tumeric supplement   Yes [provider]  ?UNABLE TO FIND Med Name: paudarco   Yes [provider]  ?UNABLE TO FIND Med Name: elderberry   Yes [provider]  ?ALPRAZolam Duanne Moron) 0.5 MG tablet Take 2 tablets approximately 45 minutes prior to the MRI study, take a third tablet if needed. ?Patient not taking: Reported on 08/11/2021 04/25/17   Guy Ducking, MD  ?VALERIAN PO Take 350 mg at bedtime by mouth. ?Patient not taking: Reported on 08/11/2021    [provider]  ? ?Social History  ? ?Socioeconomic History  ? Marital status: Divorced  ?  Spouse name: Not on file  ? Number of children: 2  ? Years of education: 12  ? Highest education level: Bachelor's degree (e.g., BA, AB, BS)  ?Occupational History  ? Occupation: Retired  ?Tobacco Use  ? Smoking status: Never  ? Smokeless tobacco: Never  ?Vaping Use  ? Vaping Use: Never used  ?Substance and Sexual Activity  ? Alcohol use: Yes  ?  Alcohol/week: 14.0 standard drinks  ?  Types: 14 Cans of beer per week  ? Drug use: No  ? Sexual activity: Yes  ?Other Topics Concern  ? Not on file  ?Social History Narrative  ? Marital status; Divorced.  Dating same male x 2000.  ?    Children:  ?    Lives:  Alone in 2019.  ?    Tobacco: none; in 2019; quit in 1978.  ?    Alcohol:  Drinks 2-3 miller lights per day.    ?    Exercise:  Plays golf two days per week; walks [place].  ?    ADLs; drives; performs ADLs; no assistant devices.  ?    Advanced Directives:  None in 2019.  HCPOA:  Son/[employer].  FULL CODE; no prolonged measures.    ? Right handed   ? ?Social Determinants of Health  ? ?Financial Resource Strain: Not on file  ?Food Insecurity: Not on file  ?Transportation Needs: Not on file  ?Physical Activity: Not on file  ?Stress: Not on file  ?Social Connections: Not on file  ?Intimate Partner Violence: Not on file  ? ? ?Observations/Objective: ?There were no vitals  filed for this visit. ?Normal speech, no distress.  Appropriate responses.  Additional history provided by patient's son.  Unable to see patient initially over video. ? ?Assessment and Plan: ?DOE (dyspnea on exertion) ? ?Fall in home, subsequent encounter ? ?Leg swelling ? ?Open wound of right lower leg, initial encounter ? ?Right leg pain ? ?Fall at home as above.  Reassuring initial imaging, evaluation in the ER, but unfortunately has had subsequent lower extremity swelling, bruising, new wound on the right leg with right leg pain and now with dyspnea on exertion with walking to the bathroom.  New cough.  Concern for other injuries discussed including possible pulmonary injury versus infection and  ER evaluation recommended today.  Son unable to help him downstairs on own and plans to call EMS for transport.  Discussed likely ER follow-up after evaluation today and would recommend in office evaluation for that visit.  Understanding of plan expressed. ? ?Follow Up Instructions: ?ER eval today.  ?  ?I discussed the assessment and treatment plan with the patient. The patient was provided an opportunity to ask questions and all were answered. The patient agreed with the plan and demonstrated an understanding of the instructions. ?  ?The patient was advised to call back or seek an in-person evaluation if the symptoms worsen or if the condition fails to improve as anticipated. ? ? ?Wendie Agreste, MD ? ?

## 2021-08-11 NOTE — Progress Notes (Signed)
BLE venous duplex has been completed. ? ?Results can be found under chart review under CV PROC. ?08/11/2021 5:27 PM ?Kin Galbraith RVT, RDMS ? ?

## 2021-08-11 NOTE — ED Notes (Signed)
Patient transported to CT 

## 2021-08-11 NOTE — Assessment & Plan Note (Addendum)
Likely hypervolemia hyponatremia in setting of new afib with volume overload. Remains low.  ?- Check urine studies, serum osm. ?- Continue lasix as above ?- Implement fluid restriction ?

## 2021-08-11 NOTE — H&P (Addendum)
?History and Physical  ? ? ?Patient: Guy Hutchinson UXN:[id number] DOB: 19-Dec-1935 ?DOA: 08/11/2021 ?DOS: the patient was seen and examined on 08/11/2021 ?PCP: Guy Agreste, MD  ?Patient coming from: Home - lives alone. Son helps out. Uses walker/cane.  ? ? ?Chief Complaint: shortness of breath/leg swelling  ? ?HPI: Guy Hutchinson is a 86 y.o. male with medical history significant of HLD, hx of prostate cancer, SCC of scalp, dizziness who had recent fall and seen in ED on 07/31/21 and was sent back home and has had decreased ability to complete ADLs.  He fell and hit his legs and face.   He had a video appointment with his PCP today and they recommended he come to ED.  His right leg has been swollen, but it started to weep and he has had new shortness of breath for the past 3 days. He notices he is more short of breath with exertion.  ? ?He denies any fever/chills, no headaches/vision changes, no chest pain or palpitations, no cough, no abdominal pain, No N/v, he has had diarrhea over the past week, no blood in his stool, no dysuria.  ? ?He has had bilateral lower leg swelling that started over the last few days. Denies any orthopnea. Maybe has gained 5 pounds.  ? ?He has had frequent falls recently.  ? ?Does not smoke, drinks daily (he drinks micky malt 3% alcohol) about 4/day.  ? ?ER Course:  vitals: afebrile, bp: 130/98, HR: 92, RR: 18, oxygen: 100%RA ?Pertinent labs: hgb: 11, sodium: 128, bnp: 1049, troponin 255>262 ?D-dimer: 3.91,  ?CT head/face: no acute finding ?Ct cervical spine: no acute finding ?CTA chest: no PE, diffuse coronary artery disease ?CXR: no edema. ? Nodular density in right lung apex.  ?Doppler: no DVT ?In ED: given '60mg'$  of lasix.  ? ? ?Review of Systems: As mentioned in the history of present illness. All other systems reviewed and are negative. ?Past Medical History:  ?Diagnosis Date  ? Cataract   ? Dizziness 04/11/2017  ? Glaucoma   ? Prostate cancer ([place])   ? ?Past Surgical History:   ?Procedure Laterality Date  ? PROSTATE SURGERY  2007  ? Impant  ? ?Social History:  reports that he has never smoked. He has never used smokeless tobacco. He reports current alcohol use of about 14.0 standard drinks per week. He reports that he does not use drugs. ? ?No Known Allergies ? ?Family History  ?Problem Relation Age of Onset  ? Diabetes Mother   ? Heart disease Brother   ? ? ?Prior to Admission medications   ?Medication Sig Start Date End Date Taking? Authorizing Provider  ?ALFALFA PO Take 1 capsule by mouth every morning.    [provider]  ?ALPRAZolam Duanne Moron) 0.5 MG tablet Take 2 tablets approximately 45 minutes prior to the MRI study, take a third tablet if needed. ?Patient not taking: Reported on 08/11/2021 04/25/17   Kathrynn Ducking, MD  ?brimonidine-timolol (COMBIGAN) 0.2-0.5 % ophthalmic solution Place 1 drop into both eyes every 12 (twelve) hours.    [provider]  ?DANDELION PO Take 1 capsule by mouth every morning.    [provider]  ?GARLIC PO Take 1 capsule by mouth 3 (three) times daily before meals.    [provider]  ?latanoprost (XALATAN) 0.005 % ophthalmic solution INT 2 GTS IN THE AFFECTED EYE QHS 10/25/17   [provider]  ?MILK THISTLE PO Take 1 capsule by mouth every morning.  [provider]  ?Multiple Vitamin (MULTIVITAMIN WITH MINERALS) TABS Take 1 tablet by mouth daily.    [provider]  ?OLIVE LEAF PO Take 400 mg every other day by mouth.    [provider]  ?OVER THE COUNTER MEDICATION Take 1 capsule by mouth every morning. OTC supplement Pyginal (antioxidant)    [provider]  ?OVER THE COUNTER MEDICATION Take 1 capsule by mouth every morning. OTC acai supplement    [provider]  ?OVER THE COUNTER MEDICATION Take 1 capsule by mouth every morning. OTC tumeric supplement    [provider]  ?UNABLE TO FIND Med Name: paudarco    [provider]  ?UNABLE TO  FIND Med Name: elderberry    [provider]  ?VALERIAN PO Take 350 mg at bedtime by mouth. ?Patient not taking: Reported on 08/11/2021    [provider]  ? ? ?Physical Exam: ?Vitals:  ? 08/11/21 1815 08/11/21 1915 08/11/21 2015 08/11/21 2054  ?BP: 124/74 131/69 (!) 140/119 (!) 136/106  ?Pulse: (!) 105 (!) 102 (!) 102 (!) 101  ?Resp: 16 (!) 23 (!) 21 20  ?Temp:    97.6 ?F (36.4 ?C)  ?TempSrc:    Oral  ?SpO2: 94% 100% 99% 95%  ?Weight:      ?Height:      ? ?General:  Appears calm and comfortable and is in NAD. Hematoma over left eye  ?Eyes:  PERRL, EOMI, normal lids, iris ?ENT:  grossly normal hearing, lips & tongue, mmm; appropriate dentition ?Neck:  no LAD, masses or thyromegaly; no carotid bruits ?Cardiovascular: irregulary, irregular, +murmur . +LE bilaterally, weeping, blisters and bruised.  ?Respiratory:   CTA bilaterally with no wheezes/rales/rhonchi.  Normal respiratory effort. ?Abdomen:  soft, NT, ND, NABS ?Back:   normal alignment, no CVAT ?Skin:  no rash or induration seen on limited exam ? ? ?Musculoskeletal:  grossly normal tone BUE/BLE, good ROM, no bony abnormality ?Lower extremity:   Limited foot exam with no ulcerations.  2+ distal pulses. ?Psychiatric:  grossly normal mood and affect, speech fluent and appropriate, AOx3 ?Neurologic:  CN 2-12 grossly intact, moves all extremities in coordinated fashion, sensation intact ? ? ?Radiological Exams on Admission: ?Independently reviewed - see discussion in A/P where applicable ? ?DG Chest 2 View ? ?Result Date: 08/11/2021 ?CLINICAL DATA:  Shortness of breath. EXAM: CHEST - 2 VIEW COMPARISON:  Chest x-ray 07/31/2021. FINDINGS: There is a vague focal nodular density in the right upper lobe measuring 2 cm overlying the anterior right second rib lungs are otherwise clear. Cardiomediastinal silhouette is stable and within normal limits. No pleural effusion or pneumothorax. No acute fractures. IMPRESSION: 1. No evidence for pneumonia or edema.  2. Questionable nodular density in the right lung apex. Recommend follow-up nonemergent chest CT. Electronically Signed   By: Ronney Asters M.D.   On: 08/11/2021 15:42  ? ?CT HEAD WO CONTRAST (5MM) ? ?Result Date: 08/11/2021 ?CLINICAL DATA:  Weak; Head trauma, moderate-severe fall EXAM: CT HEAD WITHOUT CONTRAST CT MAXILLOFACIAL WITHOUT CONTRAST TECHNIQUE: Multidetector CT imaging of the head and maxillofacial structures were performed using the standard protocol without intravenous contrast. Multiplanar CT image reconstructions of the maxillofacial structures were also generated. RADIATION DOSE REDUCTION: This exam was performed according to the departmental dose-optimization program which includes automated exposure control, adjustment of the mA and/or kV according to patient size and/or use of iterative reconstruction technique. COMPARISON:  CT head July 31, 2021 FINDINGS: CT HEAD FINDINGS Brain: There is no acute  intracranial hemorrhage, mass effect, or edema. Gray-white differentiation is preserved. Patchy and confluent areas of low-density in the supratentorial white matter are nonspecific but probably reflects stable chronic microvascular ischemic changes. No extra-axial collection. Vascular: There is intracranial atherosclerotic calcification at the skull base. Skull: Unremarkable. Other: Mastoid air cells are clear. CT MAXILLOFACIAL FINDINGS Osseous: No acute facial fracture allowing for motion degradation. Orbits: No intraorbital hematoma. Sinuses: Aerated. Soft tissues: Residual left frontal scalp soft tissue swelling/hematoma. IMPRESSION: No evidence of acute intracranial injury. No acute facial fracture. Electronically Signed   By: Macy Mis M.D.   On: 08/11/2021 18:22  ? ?CT Angio Chest PE W and/or Wo Contrast ? ?Result Date: 08/11/2021 ?CLINICAL DATA:  Elevated D-dimer EXAM: CT ANGIOGRAPHY CHEST WITH CONTRAST TECHNIQUE: Multidetector CT imaging of the chest was performed using the standard protocol  during bolus administration of intravenous contrast. Multiplanar CT image reconstructions and MIPs were obtained to evaluate the vascular anatomy. RADIATION DOSE REDUCTION: This exam was performed according

## 2021-08-11 NOTE — Consult Note (Signed)
Cardiology Consultation:   Patient ID: Guy Hutchinson MRN: [id number]; DOB: Dec 18, 1935  Admit date: 08/11/2021 Date of Consult: 08/11/2021  PCP:  Shade Flood, MD   [facility] Providers Cardiologist:  None        Patient Profile:   Guy Hutchinson is a 86 y.o. male with a no significant cardiac hx of  who is being seen 08/11/2021 for the evaluation of new-onset AF at the request of Orland Mustard, MD.  History of Present Illness:   Mr. Hermoso has h/o prostate CA, HDL, SCC scalp and h/o falls (most recently 07/31/21) who came to the ED tonight due to increasing SOB over the past several days. This has been accompanied by bilateral LE edema and ~5lb weight gain over the past several days. He does not have any cardiac history, but was found to be in AF in the ED tonight; duration of AF is unknown but EKG 07-31-21 showed sinus rhythm.  He had CTA PE protocol upon arrival in the ED; that showed no PE, but was remarkable for diffuse coronary artery calcifications. HS is mildly elevated, 255-->262. He has significant hyponatremia with Na 128, BNP elevated at 1049. CXR did not show significant pulmonary edema or pleural effusion. Chest CT similarly showed no acute cardiopulmonary disease (notable for coronary artery calcification as above). We are called to evaluate the abnormal HS trop as well as new-onset AF. TTE has been ordered by hospital medicine and is pending.   Past Medical History:  Diagnosis Date   Cataract    Dizziness 04/11/2017   Glaucoma    Prostate cancer [facility])     Past Surgical History:  Procedure Laterality Date   PROSTATE SURGERY  2007   Impant     Home Medications:  Prior to Admission medications   Medication Sig Start Date End Date Taking? Authorizing Provider  Ascorbic Acid (VITAMIN C) 500 MG CAPS Take 1 capsule by mouth daily.   Yes [provider]  dorzolamide-timolol (COSOPT) 22.3-6.8 MG/ML ophthalmic solution Place 1 drop into both eyes 2  (two) times daily. 06/07/21  Yes [provider]  GARLIC PO Take 1 capsule by mouth daily.   Yes [provider]  Homeopathic Products (ARNICA EX) Apply 1 application. topically in the morning and at bedtime.   Yes [provider]  latanoprost (XALATAN) 0.005 % ophthalmic solution Place 1 drop into both eyes at bedtime. 10/25/17  Yes [provider]  MILK THISTLE PO Take 1 capsule by mouth every morning.   Yes [provider]  Misc Natural Products (GLUCOSAMINE CHOND DOUBLE STR) CAPS Take 1 capsule by mouth daily.   Yes [provider]  Multiple Vitamin (MULTIVITAMIN WITH MINERALS) TABS Take 1 tablet by mouth every other day.   Yes [provider]  OLIVE LEAF PO Take 400 mg by mouth daily.   Yes [provider]  OVER THE COUNTER MEDICATION Take 1 capsule by mouth every morning. Cats Claw   Yes [provider]  Turmeric 400 MG CAPS Take 1 capsule by mouth daily.   Yes [provider]  UNABLE TO FIND Take 1 capsule by mouth daily. Med Name: paudarco   Yes [provider]  UNABLE TO FIND Take 1 capsule by mouth daily. Med Name: elderberry   Yes [provider]  ALPRAZolam Prudy Feeler) 0.5 MG tablet Take 2 tablets approximately 45 minutes prior to the MRI study, take a third tablet if needed. Patient not taking: Reported on 08/11/2021 04/25/17  York Spaniel, MD    Inpatient Medications: Scheduled Meds:  dorzolamide-timolol  1 drop Both Eyes BID   enoxaparin (LOVENOX) injection  40 mg Subcutaneous Q24H   folic acid  1 mg Oral Daily   latanoprost  1 drop Both Eyes QHS   metoprolol tartrate  12.5 mg Oral BID   multivitamin with minerals  1 tablet Oral Daily   sodium chloride flush  3 mL Intravenous Q12H   thiamine  100 mg Oral Daily   Or   thiamine  100 mg Intravenous Daily   Continuous Infusions:  sodium chloride     PRN Meds: sodium chloride, acetaminophen **OR** acetaminophen,  LORazepam **OR** LORazepam, sodium chloride flush  Allergies:   No Known Allergies  Social History:   Social History   Socioeconomic History   Marital status: Divorced    Spouse name: Not on file   Number of children: 2   Years of education: 16   Highest education level: Bachelor's degree (e.g., BA, AB, BS)  Occupational History   Occupation: Retired  Tobacco Use   Smoking status: Never   Smokeless tobacco: Never  Vaping Use   Vaping Use: Never used  Substance and Sexual Activity   Alcohol use: Yes    Alcohol/week: 14.0 standard drinks    Types: 14 Cans of beer per week   Drug use: No   Sexual activity: Yes  Other Topics Concern   Not on file  Social History Narrative   Marital status; Divorced.  Dating same male x 2000.      Children:      Lives:  Alone in 2019.      Tobacco: none; in 2019; quit in 1978.      Alcohol:  Drinks 2-3 miller lights per day.        Exercise:  Plays golf two days per week; walks around in malls.      ADLs; drives; performs ADLs; no assistant devices.      Advanced Directives:  None in 2019.  HCPOA:  Son/[employer].  FULL CODE; no prolonged measures.     Right handed    Social Determinants of Health   Financial Resource Strain: Not on file  Food Insecurity: Not on file  Transportation Needs: Not on file  Physical Activity: Not on file  Stress: Not on file  Social Connections: Not on file  Intimate Partner Violence: Not on file    Family History:    Family History  Problem Relation Age of Onset   Diabetes Mother    Heart disease Brother      ROS:  Please see the history of present illness.   All other ROS reviewed and negative.     Physical Exam/Data:   Vitals:   08/11/21 1815 08/11/21 1915 08/11/21 2015 08/11/21 2054  BP: 124/74 131/69 (!) 140/119 (!) 136/106  Pulse: (!) 105 (!) 102 (!) 102 (!) 101  Resp: 16 (!) 23 (!) 21 20  Temp:    97.6 F (36.4 C)  TempSrc:    Oral  SpO2: 94% 100% 99% 95%  Weight:       Height:        Intake/Output Summary (Last 24 hours) at 08/11/2021 2243 Last data filed at 08/11/2021 2055 Gross per 24 hour  Intake --  Output 275 ml  Net -275 ml   Last 3 Weights 08/11/2021 04/02/2020 03/10/2020  Weight (lbs) 168 lb 145 lb 12.8 oz 144 lb  Weight (kg) 76.204 kg 66.134  kg 65.318 kg     Body mass index is 24.11 kg/m.  General:  Well nourished, well developed, in no acute distress HEENT: normal Neck: no JVD Vascular: No carotid bruits; Distal pulses not palpable Cardiac:  normal S1, S2; irreg irreg, tachy; no murmur  Lungs:  clear to auscultation bilaterally, no wheezing, rhonchi or rales  Abd: soft, nontender, no hepatomegaly  Ext: 1+ bilat LE edema. Ext are bruised, with skin tears--pt says resulting from fall Musculoskeletal:  No deformities Skin: very thin. Multiple areas of bruising  Neuro:  CNs 2-12 intact, no focal abnormalities noted Psych:  Normal affect   EKG:  The EKG was personally reviewed and demonstrates:  AF with HR 101, PVC Telemetry:  Telemetry was personally reviewed and demonstrates:  AF w/ HR 100s-110s  Relevant CV Studies: NA  Laboratory Data:  High Sensitivity Troponin:   Recent Labs  Lab 08/11/21 1532 08/11/21 1800  TROPONINIHS 255* 262*     Chemistry Recent Labs  Lab 08/11/21 1521  NA 128*  K 3.9  CL 98  CO2 21*  GLUCOSE 130*  BUN 21  CREATININE 1.18  CALCIUM 8.4*  MG 2.5*  GFRNONAA >60  ANIONGAP 9    Recent Labs  Lab 08/11/21 1521  PROT 6.5  ALBUMIN 3.5  AST 35  ALT 28  ALKPHOS 96  BILITOT 2.2*   Lipids No results for input(s): CHOL, TRIG, HDL, LABVLDL, LDLCALC, CHOLHDL in the last 168 hours.  Hematology Recent Labs  Lab 08/11/21 1521  WBC 9.9  RBC 3.40*  HGB 11.0*  HCT 33.0*  MCV 97.1  MCH 32.4  MCHC 33.3  RDW 14.7  PLT 309   Thyroid  Recent Labs  Lab 08/11/21 1837  TSH 1.022  FREET4 1.54*    BNP Recent Labs  Lab 08/11/21 1521  BNP 1,049.1*    DDimer  Recent Labs  Lab  08/11/21 1532  DDIMER 3.91*     Radiology/Studies:  DG Chest 2 View  Result Date: 08/11/2021 CLINICAL DATA:  Shortness of breath. EXAM: CHEST - 2 VIEW COMPARISON:  Chest x-ray 07/31/2021. FINDINGS: There is a vague focal nodular density in the right upper lobe measuring 2 cm overlying the anterior right second rib lungs are otherwise clear. Cardiomediastinal silhouette is stable and within normal limits. No pleural effusion or pneumothorax. No acute fractures. IMPRESSION: 1. No evidence for pneumonia or edema. 2. Questionable nodular density in the right lung apex. Recommend follow-up nonemergent chest CT. Electronically Signed   By: Darliss Cheney M.D.   On: 08/11/2021 15:42   CT HEAD WO CONTRAST ( )  Result Date: 08/11/2021 CLINICAL DATA:  Weak; Head trauma, moderate-severe fall EXAM: CT HEAD WITHOUT CONTRAST CT MAXILLOFACIAL WITHOUT CONTRAST TECHNIQUE: Multidetector CT imaging of the head and maxillofacial structures were performed using the standard protocol without intravenous contrast. Multiplanar CT image reconstructions of the maxillofacial structures were also generated. RADIATION DOSE REDUCTION: This exam was performed according to the departmental dose-optimization program which includes automated exposure control, adjustment of the mA and/or kV according to patient size and/or use of iterative reconstruction technique. COMPARISON:  CT head July 31, 2021 FINDINGS: CT HEAD FINDINGS Brain: There is no acute intracranial hemorrhage, mass effect, or edema. Gray-white differentiation is preserved. Patchy and confluent areas of low-density in the supratentorial white matter are nonspecific but probably reflects stable chronic microvascular ischemic changes. No extra-axial collection. Vascular: There is intracranial atherosclerotic calcification at the skull base. Skull: Unremarkable. Other: Mastoid air cells are clear. CT MAXILLOFACIAL  FINDINGS Osseous: No acute facial fracture allowing for motion  degradation. Orbits: No intraorbital hematoma. Sinuses: Aerated. Soft tissues: Residual left frontal scalp soft tissue swelling/hematoma. IMPRESSION: No evidence of acute intracranial injury. No acute facial fracture. Electronically Signed   By: Guadlupe Spanish M.D.   On: 08/11/2021 18:22   CT Angio Chest PE W and/or Wo Contrast  Result Date: 08/11/2021 CLINICAL DATA:  Elevated D-dimer EXAM: CT ANGIOGRAPHY CHEST WITH CONTRAST TECHNIQUE: Multidetector CT imaging of the chest was performed using the standard protocol during bolus administration of intravenous contrast. Multiplanar CT image reconstructions and MIPs were obtained to evaluate the vascular anatomy. RADIATION DOSE REDUCTION: This exam was performed according to the departmental dose-optimization program which includes automated exposure control, adjustment of the mA and/or kV according to patient size and/or use of iterative reconstruction technique. CONTRAST:  65mL OMNIPAQUE IOHEXOL 350 MG/ML SOLN COMPARISON:  None. FINDINGS: Cardiovascular: Cardiomegaly. Diffuse coronary artery and aortic calcifications. No aneurysm. No filling defects in the pulmonary arteries to suggest pulmonary emboli. Mediastinum/Nodes: No mediastinal, hilar, or axillary adenopathy. Trachea and esophagus are unremarkable. Thyroid unremarkable. Lungs/Pleura: Linear atelectasis or scarring in the lung bases. No confluent opacities or effusions. Upper Abdomen: Imaging into the upper abdomen demonstrates no acute findings. Musculoskeletal: Chest wall soft tissues are unremarkable. No acute bony abnormality. Review of the MIP images confirms the above findings. IMPRESSION: No evidence of pulmonary embolus. Diffuse coronary artery disease. Bibasilar scarring or atelectasis. No acute cardiopulmonary disease. Aortic Atherosclerosis (ICD10-I70.0). Electronically Signed   By: Charlett Nose M.D.   On: 08/11/2021 18:22   CT Cervical Spine Wo Contrast  Result Date: 08/11/2021 CLINICAL  DATA:  Trauma. EXAM: CT CERVICAL SPINE WITHOUT CONTRAST TECHNIQUE: Multidetector CT imaging of the cervical spine was performed without intravenous contrast. Multiplanar CT image reconstructions were also generated. RADIATION DOSE REDUCTION: This exam was performed according to the departmental dose-optimization program which includes automated exposure control, adjustment of the mA and/or kV according to patient size and/or use of iterative reconstruction technique. COMPARISON:  Cervical spine CT 07/31/2021. FINDINGS: Alignment: Alignment is stable. There is 3 mm of anterolisthesis at C4-C5. Skull base and vertebrae: No acute fracture. No primary bone lesion or focal pathologic process. Soft tissues and spinal canal: No prevertebral fluid or swelling. No visible canal hematoma. Disc levels: Disc space narrowing and endplate osteophyte formation throughout the cervical spine appear similar to the prior study. There are marked degenerative changes of bilateral facet joints, also similar to the prior study. Multilevel neural foraminal stenosis has not significantly changed. There is stable moderate central canal stenosis at C3-C4. Upper chest: Negative. Other: There are atherosclerotic calcifications of the bilateral carotid artery bifurcations. IMPRESSION: 1. No acute fracture or traumatic malalignment. 2. Stable moderate severe multilevel degenerative changes. Electronically Signed   By: Darliss Cheney M.D.   On: 08/11/2021 18:22   VAS [place] LOWER EXTREMITY VENOUS (DVT)  Result Date: 08/11/2021  Lower Venous DVT Study Patient Name:  JULIEN SKARDA  Date of Exam:   08/11/2021 Medical Rec #: [id number]         Accession #:    [id number] Date of Birth: 24-Aug-1935         Patient Gender: M Patient Age:   50 years Exam Location:  [facility] Procedure:      VAS [place] LOWER EXTREMITY VENOUS (DVT) Referring Phys: ADAM CURATOLO --------------------------------------------------------------------------------   Indications: Edema. Other Indications: Recent fall (07/31/21) BLE edema with skin  discoloration/weeping. Comparison Study: Previous exam (RLEV) on 04/26/20 was negative for DVT. Performing Technologist: Ernestene Mention RVT, RDMS  Examination Guidelines: A complete evaluation includes B-mode imaging, spectral Doppler, color Doppler, and power Doppler as needed of all accessible portions of each vessel. Bilateral testing is considered an integral part of a complete examination. Limited examinations for reoccurring indications may be performed as noted. The reflux portion of the exam is performed with the patient in reverse Trendelenburg.  +---------+---------------+---------+-----------+----------+--------------+ RIGHT    CompressibilityPhasicitySpontaneityPropertiesThrombus Aging +---------+---------------+---------+-----------+----------+--------------+ CFV      Full           Yes      Yes                                 +---------+---------------+---------+-----------+----------+--------------+ SFJ      Full                                                        +---------+---------------+---------+-----------+----------+--------------+ FV Prox  Full           Yes      Yes                                 +---------+---------------+---------+-----------+----------+--------------+ FV Mid   Full           Yes      Yes                                 +---------+---------------+---------+-----------+----------+--------------+ FV DistalFull           Yes      Yes                                 +---------+---------------+---------+-----------+----------+--------------+ PFV      Full                                                        +---------+---------------+---------+-----------+----------+--------------+ POP      Full           Yes      Yes                                 +---------+---------------+---------+-----------+----------+--------------+  PTV      Full                                                        +---------+---------------+---------+-----------+----------+--------------+ PERO     Full                                                        +---------+---------------+---------+-----------+----------+--------------+   +---------+---------------+---------+-----------+----------+--------------+  LEFT     CompressibilityPhasicitySpontaneityPropertiesThrombus Aging +---------+---------------+---------+-----------+----------+--------------+ CFV      Full           Yes      Yes                                 +---------+---------------+---------+-----------+----------+--------------+ SFJ      Full                                                        +---------+---------------+---------+-----------+----------+--------------+ FV Prox  Full           Yes      Yes                                 +---------+---------------+---------+-----------+----------+--------------+ FV Mid   Full           Yes      Yes                                 +---------+---------------+---------+-----------+----------+--------------+ FV DistalFull           Yes      Yes                                 +---------+---------------+---------+-----------+----------+--------------+ PFV      Full                                                        +---------+---------------+---------+-----------+----------+--------------+ POP      Full           Yes      Yes                                 +---------+---------------+---------+-----------+----------+--------------+ PTV      Full                                                        +---------+---------------+---------+-----------+----------+--------------+ PERO     Full                                                        +---------+---------------+---------+-----------+----------+--------------+     Summary: BILATERAL: - No evidence of deep  vein thrombosis seen in the lower extremities, bilaterally. - No evidence of superficial venous thrombosis in the lower extremities, bilaterally. - RIGHT: - Focal avascular collection with mixed internal echoes measuring 6.1 x 3.7 x 3.6cm is visualized in the popliteal fossa. Subcutaneous edema to area of calf/ankle  LEFT: - No cystic structure  found in the popliteal fossa.  *See table(s) above for measurements and observations. Electronically signed by Sherald Hess MD on 08/11/2021 at 8:37:52 PM.    Final    CT Maxillofacial Wo Contrast  Result Date: 08/11/2021 CLINICAL DATA:  Weak; Head trauma, moderate-severe fall EXAM: CT HEAD WITHOUT CONTRAST CT MAXILLOFACIAL WITHOUT CONTRAST TECHNIQUE: Multidetector CT imaging of the head and maxillofacial structures were performed using the standard protocol without intravenous contrast. Multiplanar CT image reconstructions of the maxillofacial structures were also generated. RADIATION DOSE REDUCTION: This exam was performed according to the departmental dose-optimization program which includes automated exposure control, adjustment of the mA and/or kV according to patient size and/or use of iterative reconstruction technique. COMPARISON:  CT head July 31, 2021 FINDINGS: CT HEAD FINDINGS Brain: There is no acute intracranial hemorrhage, mass effect, or edema. Gray-white differentiation is preserved. Patchy and confluent areas of low-density in the supratentorial white matter are nonspecific but probably reflects stable chronic microvascular ischemic changes. No extra-axial collection. Vascular: There is intracranial atherosclerotic calcification at the skull base. Skull: Unremarkable. Other: Mastoid air cells are clear. CT MAXILLOFACIAL FINDINGS Osseous: No acute facial fracture allowing for motion degradation. Orbits: No intraorbital hematoma. Sinuses: Aerated. Soft tissues: Residual left frontal scalp soft tissue swelling/hematoma. IMPRESSION: No evidence of acute  intracranial injury. No acute facial fracture. Electronically Signed   By: Guadlupe Spanish M.D.   On: 08/11/2021 18:22     Assessment and Plan:   AF: new-onset. Rates running low 100-110s. Metoprolol ordered as 12.5mg  BID, but I am going to increase to 25mg  q6h to try to get HR better controlled. Actual duration of the PAF is not known but sx suggest this is likely a new problem. He meets criteria for anti-coagulation for CVA prophylaxis by CHADS score but has h/o frequent falls w/ multiple areas of bruising and other injury resulting from recent falls. His fall risk may be prohibitive for long-term OAC. There will likely need to be a discussion b/w pt, family, and primary medical provider to make a final decision about the safety of OAC when pt is discharged. Abn trop: trop is minimally elevated, trending flat. Trop leak may primarily be demand-induced from new-onset AF w/ RVR. Pt has coronary calcifications on CTA so there may be an element of obstructive CAD possibly contributing as well. Would cont to cycle trop; he will likely need ischemia evaluation at some point, and LHC probably not unreasonable once the more acute issues are stabilized as his renal fxn is normal (although he expressed to me a desire to not have invasive evaluation). Short-term anti-coagulation OK (hospitalist note indicates pt being treated w/ lovenox). I think taking a conservative approach with reassessment of his symptoms once the AF is better-controlled before making a decision re: ischemia evaluation is a reasonable plan. 3.   DOE/volume overload: likely multifactorial. TTE pending. LE     edema is probably due to an element of venous stasis as well as possible congestion resulting from new AF/RVR; agree w/ wound consult. Diuresis as per hospitalist team. DOE may be due to loss of atrial kick + mild congestion from RVR. Pt does not seem to have had anginal sx prior to this episode. See #2 above re ischemia eval. 4.    Hyponatremia: may be attributable to heavy EtOH use. Carefully monitor sodium levels with diuresis  Risk Assessment/Risk Scores:     TIMI Risk Score for Unstable Angina or Non-ST Elevation MI:   The patient's TIMI risk score is 3,  which indicates a 13% risk of all cause mortality, new or recurrent myocardial infarction or need for urgent revascularization in the next 14 days.  New York Heart Association (NYHA) Functional Class NYHA Class II  CHA2DS2-VASc Score =     This indicates a  % annual risk of stroke. The patient's score is based upon:   CHADS score 3 (male, >age75, coronary artery calcifications/plaque)        For questions or updates, please contact [facility] Please consult [email or web address] for contact info under    Signed, Precious Reel, MD, [facility] 08/11/2021 10:43 PM

## 2021-08-11 NOTE — ED Notes (Signed)
Back from sono ?

## 2021-08-11 NOTE — ED Triage Notes (Signed)
Patient BIB [facility] from home for evaluation of lower extremity edema and weeping, exertional shortness of breath. Denies pain. Denies history of afib, current cardiac rhythm irregular. Patient is alert, oriented, and in no apparent distress at this time. ?

## 2021-08-11 NOTE — Assessment & Plan Note (Signed)
Follow up outpatient ?

## 2021-08-12 ENCOUNTER — Other Ambulatory Visit (HOSPITAL_COMMUNITY): Payer: Self-pay

## 2021-08-12 ENCOUNTER — Observation Stay (HOSPITAL_COMMUNITY): Payer: Medicare Other

## 2021-08-12 ENCOUNTER — Encounter (HOSPITAL_COMMUNITY): Payer: Self-pay | Admitting: Family Medicine

## 2021-08-12 DIAGNOSIS — Z66 Do not resuscitate: Secondary | ICD-10-CM | POA: Diagnosis present

## 2021-08-12 DIAGNOSIS — E7800 Pure hypercholesterolemia, unspecified: Secondary | ICD-10-CM | POA: Diagnosis present

## 2021-08-12 DIAGNOSIS — Z833 Family history of diabetes mellitus: Secondary | ICD-10-CM | POA: Diagnosis not present

## 2021-08-12 DIAGNOSIS — I509 Heart failure, unspecified: Secondary | ICD-10-CM | POA: Diagnosis not present

## 2021-08-12 DIAGNOSIS — Z85828 Personal history of other malignant neoplasm of skin: Secondary | ICD-10-CM | POA: Diagnosis not present

## 2021-08-12 DIAGNOSIS — E871 Hypo-osmolality and hyponatremia: Secondary | ICD-10-CM | POA: Diagnosis present

## 2021-08-12 DIAGNOSIS — Z79899 Other long term (current) drug therapy: Secondary | ICD-10-CM | POA: Diagnosis not present

## 2021-08-12 DIAGNOSIS — E8770 Fluid overload, unspecified: Secondary | ICD-10-CM | POA: Diagnosis not present

## 2021-08-12 DIAGNOSIS — I5031 Acute diastolic (congestive) heart failure: Secondary | ICD-10-CM | POA: Insufficient documentation

## 2021-08-12 DIAGNOSIS — I4891 Unspecified atrial fibrillation: Secondary | ICD-10-CM | POA: Diagnosis present

## 2021-08-12 DIAGNOSIS — I4811 Longstanding persistent atrial fibrillation: Secondary | ICD-10-CM | POA: Diagnosis not present

## 2021-08-12 DIAGNOSIS — I480 Paroxysmal atrial fibrillation: Secondary | ICD-10-CM | POA: Diagnosis present

## 2021-08-12 DIAGNOSIS — D72828 Other elevated white blood cell count: Secondary | ICD-10-CM | POA: Diagnosis present

## 2021-08-12 DIAGNOSIS — R600 Localized edema: Secondary | ICD-10-CM

## 2021-08-12 DIAGNOSIS — Z789 Other specified health status: Secondary | ICD-10-CM | POA: Diagnosis not present

## 2021-08-12 DIAGNOSIS — R778 Other specified abnormalities of plasma proteins: Secondary | ICD-10-CM

## 2021-08-12 DIAGNOSIS — R17 Unspecified jaundice: Secondary | ICD-10-CM | POA: Diagnosis present

## 2021-08-12 DIAGNOSIS — Z8546 Personal history of malignant neoplasm of prostate: Secondary | ICD-10-CM | POA: Diagnosis not present

## 2021-08-12 DIAGNOSIS — E876 Hypokalemia: Secondary | ICD-10-CM | POA: Diagnosis present

## 2021-08-12 DIAGNOSIS — I352 Nonrheumatic aortic (valve) stenosis with insufficiency: Secondary | ICD-10-CM | POA: Diagnosis present

## 2021-08-12 DIAGNOSIS — M7121 Synovial cyst of popliteal space [Baker], right knee: Secondary | ICD-10-CM

## 2021-08-12 DIAGNOSIS — N179 Acute kidney failure, unspecified: Secondary | ICD-10-CM | POA: Diagnosis present

## 2021-08-12 DIAGNOSIS — Z20822 Contact with and (suspected) exposure to covid-19: Secondary | ICD-10-CM | POA: Diagnosis present

## 2021-08-12 DIAGNOSIS — Z9181 History of falling: Secondary | ICD-10-CM | POA: Diagnosis not present

## 2021-08-12 DIAGNOSIS — Z8249 Family history of ischemic heart disease and other diseases of the circulatory system: Secondary | ICD-10-CM | POA: Diagnosis not present

## 2021-08-12 DIAGNOSIS — T502X5A Adverse effect of carbonic-anhydrase inhibitors, benzothiadiazides and other diuretics, initial encounter: Secondary | ICD-10-CM | POA: Diagnosis present

## 2021-08-12 LAB — CBC
HCT: 30.5 % — ABNORMAL LOW (ref 39.0–52.0)
Hemoglobin: 10.6 g/dL — ABNORMAL LOW (ref 13.0–17.0)
MCH: 32.9 pg (ref 26.0–34.0)
MCHC: 34.8 g/dL (ref 30.0–36.0)
MCV: 94.7 fL (ref 80.0–100.0)
Platelets: 298 10*3/uL (ref 150–400)
RBC: 3.22 MIL/uL — ABNORMAL LOW (ref 4.22–5.81)
RDW: 14.6 % (ref 11.5–15.5)
WBC: 12.4 10*3/uL — ABNORMAL HIGH (ref 4.0–10.5)
nRBC: 0 % (ref 0.0–0.2)

## 2021-08-12 LAB — ECHOCARDIOGRAM COMPLETE
AR max vel: 2.35 cm2
AV Area VTI: 1.51 cm2
AV Area mean vel: 2.3 cm2
AV Mean grad: 58 mmHg
AV Peak grad: 57 mmHg
Ao pk vel: 3.78 m/s
Area-P 1/2: 4.8 cm2
Calc EF: 42.3 %
Height: 70 in
P 1/2 time: 441 msec
S' Lateral: 3.5 cm
Single Plane A2C EF: 52.6 %
Single Plane A4C EF: 33.2 %
Weight: 2677.27 oz

## 2021-08-12 LAB — LIPID PANEL
Cholesterol: 176 mg/dL (ref 0–200)
HDL: 26 mg/dL — ABNORMAL LOW (ref >40)
LDL Cholesterol: 132 mg/dL — ABNORMAL HIGH (ref 0–99)
Total CHOL/HDL Ratio: 6.8 RATIO
Triglycerides: 88 mg/dL (ref <150)
VLDL: 18 mg/dL (ref 0–40)

## 2021-08-12 LAB — BASIC METABOLIC PANEL
Anion gap: 9 (ref 5–15)
BUN: 21 mg/dL (ref 8–23)
CO2: 22 mmol/L (ref 22–32)
Calcium: 8.3 mg/dL — ABNORMAL LOW (ref 8.9–10.3)
Chloride: 98 mmol/L (ref 98–111)
Creatinine, Ser: 1.23 mg/dL (ref 0.61–1.24)
GFR, Estimated: 58 mL/min — ABNORMAL LOW (ref >60)
Glucose, Bld: 126 mg/dL — ABNORMAL HIGH (ref 70–99)
Potassium: 3.2 mmol/L — ABNORMAL LOW (ref 3.5–5.1)
Sodium: 129 mmol/L — ABNORMAL LOW (ref 135–145)

## 2021-08-12 LAB — TROPONIN I (HIGH SENSITIVITY): Troponin I (High Sensitivity): 218 ng/L (ref <18)

## 2021-08-12 LAB — MRSA NEXT GEN BY PCR, NASAL: MRSA by PCR Next Gen: NOT DETECTED

## 2021-08-12 LAB — HEPARIN LEVEL (UNFRACTIONATED): Heparin Unfractionated: 0.34 IU/mL (ref 0.30–0.70)

## 2021-08-12 MED ORDER — VITAMIN D 25 MCG (1000 UNIT) PO TABS
1000.0000 [IU] | ORAL_TABLET | Freq: Every day | ORAL | Status: DC
  Administered 2021-08-12 – 2021-08-15 (×4): 1000 [IU] via ORAL
  Filled 2021-08-12 (×4): qty 1

## 2021-08-12 MED ORDER — HEPARIN BOLUS VIA INFUSION
3000.0000 [IU] | Freq: Once | INTRAVENOUS | Status: AC
  Administered 2021-08-12: 3000 [IU] via INTRAVENOUS
  Filled 2021-08-12: qty 3000

## 2021-08-12 MED ORDER — PERFLUTREN LIPID MICROSPHERE
1.0000 mL | INTRAVENOUS | Status: AC | PRN
  Administered 2021-08-12: 2 mL via INTRAVENOUS
  Filled 2021-08-12: qty 10

## 2021-08-12 MED ORDER — FUROSEMIDE 10 MG/ML IJ SOLN
60.0000 mg | Freq: Once | INTRAMUSCULAR | Status: AC
  Administered 2021-08-12: 60 mg via INTRAVENOUS
  Filled 2021-08-12: qty 6

## 2021-08-12 MED ORDER — POTASSIUM CHLORIDE CRYS ER 20 MEQ PO TBCR
40.0000 meq | EXTENDED_RELEASE_TABLET | Freq: Once | ORAL | Status: AC
  Administered 2021-08-12: 40 meq via ORAL
  Filled 2021-08-12: qty 2

## 2021-08-12 MED ORDER — HEPARIN (PORCINE) 25000 UT/250ML-% IV SOLN
1500.0000 [IU]/h | INTRAVENOUS | Status: DC
  Administered 2021-08-12: 1000 [IU]/h via INTRAVENOUS
  Administered 2021-08-13: 1150 [IU]/h via INTRAVENOUS
  Administered 2021-08-14: 1350 [IU]/h via INTRAVENOUS
  Filled 2021-08-12 (×3): qty 250

## 2021-08-12 NOTE — TOC Progression Note (Signed)
Transition of Care (TOC) - Progression Note  ? ? ?Patient Details  ?Name: Guy Hutchinson ?MRN: [id number] ?Date of Birth: 1936/01/24 ? ?Transition of Care (TOC) CM/SW Contact  ?Angelita Ingles, RN ?Phone [phone] ? ?08/12/2021, 2:53 PM ? ?Clinical Narrative:    ?TOC acknowledges consult, SW will follow for SNF needs.  ? ? ?  ?  ? ?Expected Discharge Plan and Services ?  ?  ?  ?  ?  ?                ?  ?  ?  ?  ?  ?  ?  ?  ?  ?  ? ? ?Social Determinants of Health (SDOH) Interventions ?  ? ?Readmission Risk Interventions ?No flowsheet data found. ? ?

## 2021-08-12 NOTE — Plan of Care (Signed)
?  Problem: Activity: ?Goal: Ability to tolerate increased activity will improve ?Outcome: Progressing ?  ?Problem: Cardiac: ?Goal: Ability to achieve and maintain adequate cardiopulmonary perfusion will improve ?Outcome: Progressing ?  ?Problem: Education: ?Goal: Understanding of medication regimen will improve ?Outcome: Not Progressing ?  ?Problem: Health Behavior/Discharge Planning: ?Goal: Ability to safely manage health-related needs after discharge will improve ?Outcome: Not Progressing ?  ?

## 2021-08-12 NOTE — Progress Notes (Signed)
Bil legs cleansed, with soap and water. Covered both with xeroform dressing, wrapped with kerlix and ace bandage applied. Tolerated well. ?

## 2021-08-12 NOTE — Progress Notes (Signed)
?  Echocardiogram ?2D Echocardiogram has been performed. ? ?Guy Hutchinson ?08/12/2021, 9:39 AM ?

## 2021-08-12 NOTE — Assessment & Plan Note (Addendum)
In the absence of evidence of hemolysis, unconjugated hyperbili in this setting is most consistent with Gilbert's. Also possible related to widespread ecchymoses. ?

## 2021-08-12 NOTE — Consult Note (Signed)
WOC Nurse Consult Note: ?Reason for Consult: Consult requested for bilat legs.  Performed remotely after review of progress notes and photos in the EMR.  ?Wound type: Bilat legs with generalized edema and mod amt tan drainage weeping; skin with darker-colored skin changes, patchy areas of partial thickness skin loss and red moist wounds.  ?Dressing procedure/placement/frequency: Topical treatment orders provided for bedside nurses to perform as follows to provide light compression and promote drying and healing as follows: Apply xeroform gauze to bilat legs Q day, then cover with ABD pads and kerlex, beginning just behind toes to below knees, then cover with Ace wrap in the same manner.  ?Please re-consult if further assistance is needed.  Thank-you,  ?Julien Girt MSN, RN, [place], [place], CNS ?[phone]  ?  ?

## 2021-08-12 NOTE — Evaluation (Signed)
Occupational Therapy Evaluation ?Patient Details ?Name: Guy Hutchinson ?MRN: [id number] ?DOB: [birth year] ?Today's Date: 08/12/2021 ? ? ?History of Present Illness 86 yo male admitted 3/15 with SOB and Afib. PMhx:prostate CA, HLD, SCC scalp, ETOH use, glaucoma and h/o falls (most recently 07/31/21)  ? ?Clinical Impression ?  ?PTA, pt was living alone and requiring assistance from his son for LB ADLs and IADLs. Currently, pt requires Min A for UB ADLs, Mod-Max A for LB ADLs, and Min A for functional mobility using RW. Pt presenting with decreased balance, strength, and activity tolerance. Pt would benefit from further acute OT to facilitate safe dc. Recommend dc to SNF for further OT to optimize safety, independence with ADLs, and return to PLOF.  ?   ? ?Recommendations for follow up therapy are one component of a multi-disciplinary discharge planning process, led by the attending physician.  Recommendations may be updated based on patient status, additional functional criteria and insurance authorization.  ? ?Follow Up Recommendations ? Skilled nursing-short term rehab (<3 hours/day)  ?  ?Assistance Recommended at Discharge Frequent or constant Supervision/Assistance  ?Patient can return home with the following   ? ?  ?Functional Status Assessment ? Patient has had a recent decline in their functional status and demonstrates the ability to make significant improvements in function in a reasonable and predictable amount of time.  ?Equipment Recommendations ? BSC/3in1  ?  ?Recommendations for Other Services PT consult ? ? ?  ?Precautions / Restrictions Precautions ?Precautions: Fall;Other (comment) ?Precaution Comments: watch BP ?Restrictions ?Weight Bearing Restrictions: No  ? ?  ? ?Mobility Bed Mobility ?Overal bed mobility: Needs Assistance ?Bed Mobility: Sit to Supine ?  ?  ?  ?Sit to supine: Min assist ?  ?General bed mobility comments: Min A for assisting BLEs ?  ? ?Transfers ?Overall transfer level: Needs  assistance ?Equipment used: Rolling walker (2 wheels) ?Transfers: Sit to/from Stand ?Sit to Stand: Min assist ?  ?  ?  ?  ?  ?General transfer comment: Min A for power up and gaining balance; presenting with posteiror lean ?  ? ?  ?Balance Overall balance assessment: History of Falls, Needs assistance ?Sitting-balance support: No upper extremity supported, Feet supported ?Sitting balance-Leahy Scale: Fair ?  ?  ?Standing balance support: Bilateral upper extremity supported ?Standing balance-Leahy Scale: Poor ?  ?  ?  ?  ?  ?  ?  ?  ?  ?  ?  ?  ?   ? ?ADL either performed or assessed with clinical judgement  ? ?ADL Overall ADL's : Needs assistance/impaired ?Eating/Feeding: Set up;Sitting ?  ?Grooming: Supervision/safety;Set up;Sitting ?  ?Upper Body Bathing: Minimal assistance;Sitting ?  ?Lower Body Bathing: Moderate assistance;Sit to/from stand ?  ?Upper Body Dressing : Minimal assistance;Sitting ?  ?Lower Body Dressing: Maximal assistance;Sit to/from stand ?  ?Toilet Transfer: Minimal assistance;Ambulation;Rolling walker (2 wheels) (simulated to recliner) ?  ?Toileting- Clothing Manipulation and Hygiene: Minimal assistance;Sitting/lateral lean ?Toileting - Clothing Manipulation Details (indicate cue type and reason): Min A for standing balance ?  ?  ?Functional mobility during ADLs: Minimal assistance;Rolling walker (2 wheels) ?General ADL Comments: Pt presenting with decreased balance, strength, cognition, and safety  ? ? ? ?Vision   ?   ?   ?Perception   ?  ?Praxis   ?  ? ?Pertinent Vitals/Pain Pain Assessment ?Pain Assessment: Faces ?Faces Pain Scale: Hurts little more ?Pain Location: bil LE with touch ?Pain Descriptors / Indicators: Aching, Sore ?Pain Intervention(s): Monitored during session, Limited activity  within patient's tolerance, Repositioned  ? ? ? ?Hand Dominance   ?  ?Extremity/Trunk Assessment Upper Extremity Assessment ?Upper Extremity Assessment: Generalized weakness ?  ?Lower Extremity  Assessment ?Lower Extremity Assessment: Generalized weakness ?  ?Cervical / Trunk Assessment ?Cervical / Trunk Assessment: Kyphotic ?  ?Communication Communication ?Communication: No difficulties ?  ?Cognition Arousal/Alertness: Awake/alert ?Behavior During Therapy: Flat affect ?Overall Cognitive Status: Impaired/Different from baseline ?Area of Impairment: Safety/judgement, Orientation, Problem solving ?  ?  ?  ?  ?  ?  ?  ?  ?Orientation Level: Time ?  ?  ?  ?Safety/Judgement: Decreased awareness of safety, Decreased awareness of deficits ?  ?Problem Solving: Slow processing ?General Comments: Perseverating on getting his gell for his face and his legs (but he has been told he cant have it prior to session). Requiring increased time adn cues throughout. ?  ?  ?General Comments  VSS on RA. Daughter and son present throughout ? ?  ?Exercises   ?  ?Shoulder Instructions    ? ? ?Home Living Family/patient expects to be discharged to:: Private residence ?Living Arrangements: Alone ?Available Help at Discharge: Family;Available PRN/intermittently (son checks in and delivers food) ?Type of Home: House ?Home Access: Stairs to enter ?Entrance Stairs-Number of Steps: 7 ?Entrance Stairs-Rails: Left ?Home Layout: One level ?  ?  ?Bathroom Shower/Tub: Walk-in shower ?  ?Bathroom Toilet: Standard ?  ?  ?Home Equipment: Cane - single Barista (2 wheels) ?  ?Additional Comments: 3 falls in last year ?  ? ?  ?Prior Functioning/Environment Prior Level of Function : Needs assist ?  ?  ?  ?Physical Assist : Mobility (physical) ?Mobility (physical): Gait ?  ?Mobility Comments: walks with RW or cane ?ADLs Comments: Son assists with LB dressing and performs IADL including delivering food. ?  ? ?  ?  ?OT Problem List: Decreased strength;Decreased range of motion;Decreased activity tolerance;Impaired balance (sitting and/or standing) ?  ?   ?OT Treatment/Interventions: Self-care/ADL training;Therapeutic exercise;Energy  conservation;DME and/or AE instruction  ?  ?OT Goals(Current goals can be found in the care plan section) Acute Rehab OT Goals ?Patient Stated Goal: Go home ?OT Goal Formulation: With patient ?Time For Goal Achievement: 08/26/21 ?Potential to Achieve Goals: Good  ?OT Frequency: Min 2X/week ?  ? ?Co-evaluation   ?  ?  ?  ?  ? ?  ?AM-PAC OT "6 Clicks" Daily Activity     ?Outcome Measure Help from another person eating meals?: A Little ?Help from another person taking care of personal grooming?: A Little ?Help from another person toileting, which includes using toliet, bedpan, or urinal?: A Little ?Help from another person bathing (including washing, rinsing, drying)?: A Lot ?Help from another person to put on and taking off regular upper body clothing?: A Little ?Help from another person to put on and taking off regular lower body clothing?: A Lot ?6 Click Score: 16 ?  ?End of Session Equipment Utilized During Treatment: Rolling walker (2 wheels);Gait belt ?Nurse Communication: Mobility status ? ?Activity Tolerance: Patient tolerated treatment well ?Patient left: in bed;with call bell/phone within reach;with bed alarm set ? ?OT Visit Diagnosis: Unsteadiness on feet (R26.81);Other abnormalities of gait and mobility (R26.89);Muscle weakness (generalized) (M62.81)  ?              ?Time: 6553-7482 ?OT Time Calculation (min): 30 min ?Charges:  OT General Charges ?$OT Visit: 1 Visit ?OT Evaluation ?$OT Eval Moderate Complexity: 1 Mod ?OT Treatments ?$Self Care/Home Management : 8-22 mins ? ?Guy Hutchinson  MSOT, OTR/L ?Acute Rehab ?Pager: [phone] ?Office: [phone] ? ?Guy Hutchinson ?08/12/2021, 3:29 PM ?

## 2021-08-12 NOTE — Progress Notes (Signed)
ANTICOAGULATION CONSULT NOTE - Initial Consult ? ?Pharmacy Consult for heparin ?Indication: atrial fibrillation ? ?No Known Allergies ? ?Patient Measurements: ?Height: '5\' 10"'$  (177.8 cm) ?Weight: 75.9 kg (167 lb 5.3 oz) ?IBW/kg (Calculated) : 73 ?Heparin Dosing Weight: 76kg ? ?Vital Signs: ?Temp: 98.9 ?F (37.2 ?C) (03/17 0749) ?Temp Source: Oral (03/17 0749) ?BP: 104/67 (03/17 0750) ?Pulse Rate: 110 (03/17 0750) ? ?Labs: ?Recent Labs  ?  08/11/21 ?1521 08/11/21 ?1532 08/11/21 ?1800 08/11/21 ?2113 08/12/21 ?0107  ?HGB 11.0*  --   --   --  10.6*  ?HCT 33.0*  --   --   --  30.5*  ?PLT 309  --   --   --  298  ?LABPROT  --   --   --  15.8*  --   ?INR  --   --   --  1.3*  --   ?CREATININE 1.18  --   --   --  1.23  ?CKTOTAL  --  132  --   --   --   ?TROPONINIHS  --  255* 262*  --  218*  ? ? ?Estimated Creatinine Clearance: 47 mL/min (by C-G formula based on SCr of 1.23 mg/dL). ? ? ?Medical History: ?Past Medical History:  ?Diagnosis Date  ? Cataract   ? Dizziness 04/11/2017  ? Glaucoma   ? Prostate cancer ([place])   ? ? ?Assessment: ?86 year old male with new afib. Not on anticoagulation prior to admit. Discussion ongoing with family about transition to oral anticoagulation. Will use IV heparin for now. Received lovenox last night 3/16pm. CBC stable, no bleeding issues noted.  ? ?Goal of Therapy:  ?Heparin level 0.3-0.7 units/ml ?Monitor platelets by anticoagulation protocol: Yes ?  ?Plan:  ?Give 3000 units bolus x 1 ?Start heparin infusion at 1000 units/hr ?Check anti-Xa level in 8 hours and daily while on heparin ?Continue to monitor H&H and platelets ? ?Erin Hearing PharmD., BCPS ?Clinical Pharmacist ?08/12/2021 9:42 AM ? ?

## 2021-08-12 NOTE — Plan of Care (Signed)
?  Problem: Education: ?Goal: Knowledge of disease or condition will improve ?Outcome: Progressing ?  ?Problem: Cardiac: ?Goal: Ability to achieve and maintain adequate cardiopulmonary perfusion will improve ?Outcome: Progressing ?  ?

## 2021-08-12 NOTE — Progress Notes (Signed)
Family brought from home all herbal  medicine as per pt request. Pharmacist spoke with pt and family about this.  Above mentioned meds taken back home  by family. ?

## 2021-08-12 NOTE — Progress Notes (Signed)
?  Progress Note ? ?Patient: Guy Hutchinson HKV:[id number] DOB: Jul 25, 1935  ?DOA: 08/11/2021  DOS: 08/12/2021  ?  ?Brief hospital course: ?Guy Hutchinson is an 86 y.o. male with a history of HLD, prostate CA, recurrent falls, daily alcohol use who presented to the ED 3/16 with leg swelling, dyspnea worse with exertion, and worsening weakness and falls. He appeared peripherally volume overloaded consistent with CHF, and with new atrial fibrillation. IV diuresis, metoprolol, IV heparin were given, and cardiology consult pursued. Echo is pending. ? ?Assessment and Plan: ?* Atrial fibrillation ([place]) ?New diagnosis.  ?- Continue metoprolol '25mg'$  po q6h ?- Continue cardiac monitoring.  ?- Continue discussions regarding anticoagulation long term. IV heparin was started here ?- Echo pending ?- Maintain K and Mg levels ? ?Elevated troponin ?Troponin 255>265 without chest pain or ischemic ST changes on ECG. Pt has extensive coronary calcifications on CT, but presentation is not consistent with ACS at this time.  ?- Cardiology not currently planning intervention/work up as inpatient. ?- Continue BB, statin, anticoagulation as above. ? ?Hyponatremia ?Likely hypervolemia hyponatremia in setting of new afib with volume overload. Improved with diuresis thus far.  ?- Continue monitoring. ? ?Bilateral lower extremity edema ?BNP >1000, echo pending. Edema is likely multifactorial inclusive of venous stasis based on hyperpigmentation (in addition to bruising). No DVT on U/S. ?- WOC consulted for weeping wounds.  will need to rule out ? ?Baker's cyst of knee, right ?Follow up outpatient  ? ?Pure hypercholesterolemia ?LDL 132, HDL 23.  ?- Given extensive coronary calcifications, would qualify for statin. LFTs wnl. ? ?Alcohol use ?Daily use, unclear quantity.  ?- Continue on CIWA protocol. No evidence of withdrawal yet. ? ?Subjective: Feels fine, hasn't gotten OOB at the time of my evaluation. No chest pain. The leg swelling is stable he  thinks and severe. He's urinating a fair amount more than usual.  ? ?Objective: ?Vitals:  ? 08/12/21 0630 08/12/21 0749 08/12/21 0750 08/12/21 1130  ?BP: (!) 101/55 104/67 104/67 98/61  ?Pulse:  99 92 93  ?Resp:  '20 20 20  '$ ?Temp:  98.9 ?F (37.2 ?C)  98.8 ?F (37.1 ?C)  ?TempSrc:  Oral  Oral  ?SpO2:  96% 97% 99%  ?Weight:      ?Height:      ? ?Gen: Elderly male in no distress ?Pulm: Nonlabored breathing room air. Clear ?CV: Irreg irreg, rate in 90's without murmur, rub, or gallop. + JVD, + dependent edema. ?GI: Abdomen soft, non-tender, non-distended, with normoactive bowel sounds.  ?Ext: Warm, no deformities ?Skin: Severe bilateral facial ecchymoses and ecchymosis/hemosiderosis of lower legs as well. No other rashes, lesions or ulcers on visualized skin. ?Neuro: Alert and without focal neurological deficits. ?Psych: Judgement and insight appear fair/marginal. Mood euthymic & affect congruent. Behavior is appropriate.   ? ?Data Personally reviewed: ?Hgb: 11, sodium: 128, bnp: 1049, troponin 255>262 ?D-dimer: 3.91 ?TSH 1.022 ?LDL 132, HDL 26 ?CT head/face: no acute finding ?Ct cervical spine: no acute finding ?CTA chest: no PE, diffuse coronary artery disease ?CXR: no edema. ? Nodular density in right lung apex.  ?Doppler: no DVT ?In ED: given '60mg'$  of lasix.  ? ?Family Communication: None at bedside ? ?Disposition: ?Status is: Inpatient ?Remains inpatient appropriate because: Needs IV diuresis ?Planned Discharge Destination: Skilled nursing facility ? ?Patrecia Pour, MD ?08/12/2021 1:28 PM ?Page by [email or web address]  ?

## 2021-08-12 NOTE — Evaluation (Signed)
Physical Therapy Evaluation ?Patient Details ?Name: Guy Hutchinson ?MRN: [id number] ?DOB: 1936-01-18 ?Today's Date: 08/12/2021 ? ?History of Present Illness ? 86 yo male admitted 3/15 with SOB and Afib. PMhx:prostate CA, HLD, SCC scalp, ETOH use, glaucoma and h/o falls (most recently 07/31/21)  ?Clinical Impression ? Pt with significant bruising across face, legs and hips with pt reporting fall downstairs. Pt states he lives with son who works and cannot have supervision at home. Pt with decreased awareness of safety and deficits repeatedly asking for baby aspirin for "neck vessels" with current heparin IV. Pt with decreased strength function and balance who will benefit from acute therapy to maximize mobility, safety and function to decrease burden of care.  ? ?126/67 supine, HR 98 ?92/60 sitting, HR 90 ?98/61 standing, HR 85 ? ?Sats 98% RA ?   ? ?Recommendations for follow up therapy are one component of a multi-disciplinary discharge planning process, led by the attending physician.  Recommendations may be updated based on patient status, additional functional criteria and insurance authorization. ? ?Follow Up Recommendations Skilled nursing-short term rehab (<3 hours/day) ? ?  ?Assistance Recommended at Discharge Frequent or constant Supervision/Assistance  ?Patient can return home with the following ? A little help with walking and/or transfers;A little help with bathing/dressing/bathroom;Assistance with cooking/housework;Direct supervision/assist for financial management;Assist for transportation;Direct supervision/assist for medications management;Help with stairs or ramp for entrance ? ?  ?Equipment Recommendations BSC/3in1  ?Recommendations for Other Services ?    ?  ?Functional Status Assessment Patient has had a recent decline in their functional status and demonstrates the ability to make significant improvements in function in a reasonable and predictable amount of time.  ? ?  ?Precautions / Restrictions  Precautions ?Precautions: Fall;Other (comment) ?Precaution Comments: watch BP  ? ?  ? ?Mobility ? Bed Mobility ?Overal bed mobility: Needs Assistance ?Bed Mobility: Supine to Sit ?  ?  ?Supine to sit: Min guard, HOB elevated ?  ?  ?General bed mobility comments: HOB 40 degrees with increased time and effort with reliance on rail ?  ? ?Transfers ?Overall transfer level: Needs assistance ?  ?Transfers: Sit to/from Stand ?Sit to Stand: Min assist ?  ?  ?  ?  ?  ?General transfer comment: min assist to rise from bed and toilet with cues for hand placement and safety x 3 trials ?  ? ?Ambulation/Gait ?Ambulation/Gait assistance: Min guard ?Gait Distance (Feet): 18 Feet ?Assistive device: Rolling walker (2 wheels) ?Gait Pattern/deviations: Step-through pattern, Decreased stride length, Trunk flexed ?  ?Gait velocity interpretation: <1.8 ft/sec, indicate of risk for recurrent falls ?  ?General Gait Details: cues for safety and direction with assist for line management. pt walked 7' then 18' after toileting. pt required assist for pericare for completeness. Pt denied further gait with lightheadedness ? ?Stairs ?  ?  ?  ?  ?  ? ?Wheelchair Mobility ?  ? ?Modified Rankin (Stroke Patients Only) ?  ? ?  ? ?Balance Overall balance assessment: History of Falls, Needs assistance ?Sitting-balance support: No upper extremity supported, Feet supported ?Sitting balance-Leahy Scale: Fair ?Sitting balance - Comments: EOB and toilet without LOB ?  ?Standing balance support: Bilateral upper extremity supported ?Standing balance-Leahy Scale: Poor ?Standing balance comment: able to static stand with supervision to wash hands, Rw for gait ?  ?  ?  ?  ?  ?  ?  ?  ?  ?  ?  ?   ? ? ? ?Pertinent Vitals/Pain Pain Assessment ?Pain Assessment: 0-10 ?Pain  Score: 4  ?Pain Location: bil LE with touch ?Pain Descriptors / Indicators: Aching, Sore ?Pain Intervention(s): Limited activity within patient's tolerance, Monitored during session, Repositioned   ? ? ?Home Living Family/patient expects to be discharged to:: Private residence ?Living Arrangements: Children ?Available Help at Discharge: Family;Available PRN/intermittently ?Type of Home: House ?Home Access: Stairs to enter ?Entrance Stairs-Rails: Left ?Entrance Stairs-Number of Steps: 7 ?  ?Home Layout: One level ?Home Equipment: Cane - single Barista (2 wheels) ?Additional Comments: 3 falls in last year  ?  ?Prior Function Prior Level of Function : Needs assist ?  ?  ?  ?Physical Assist : Mobility (physical) ?Mobility (physical): Gait ?  ?Mobility Comments: walks with RW or cane ?ADLs Comments: son assists with getting pants on ?  ? ? ?Hand Dominance  ?   ? ?  ?Extremity/Trunk Assessment  ? Upper Extremity Assessment ?Upper Extremity Assessment: Generalized weakness ?  ? ?Lower Extremity Assessment ?Lower Extremity Assessment: Generalized weakness (bil LE edema with wounds and bruising. Weakness with grossly 2+/5 strength) ?  ? ?Cervical / Trunk Assessment ?Cervical / Trunk Assessment: Kyphotic  ?Communication  ? Communication: No difficulties  ?Cognition Arousal/Alertness: Awake/alert ?Behavior During Therapy: Flat affect ?Overall Cognitive Status: Impaired/Different from baseline ?Area of Impairment: Safety/judgement, Orientation, Problem solving ?  ?  ?  ?  ?  ?  ?  ?  ?Orientation Level: Time ?  ?  ?  ?Safety/Judgement: Decreased awareness of safety, Decreased awareness of deficits ?  ?Problem Solving: Slow processing ?  ?  ?  ? ?  ?General Comments   ? ?  ?Exercises    ? ?Assessment/Plan  ?  ?PT Assessment Patient needs continued PT services  ?PT Problem List Decreased strength;Decreased mobility;Decreased safety awareness;Decreased activity tolerance;Cardiopulmonary status limiting activity;Decreased balance;Decreased knowledge of use of DME;Decreased skin integrity;Decreased cognition ? ?   ?  ?PT Treatment Interventions Gait training;Balance training;Stair training;Therapeutic  activities;Patient/family education;Therapeutic exercise;DME instruction;Cognitive remediation;Functional mobility training   ? ?PT Goals (Current goals can be found in the Care Plan section)  ?Acute Rehab PT Goals ?Patient Stated Goal: return home and sing ?PT Goal Formulation: With patient ?Time For Goal Achievement: 08/26/21 ?Potential to Achieve Goals: Fair ? ?  ?Frequency Min 3X/week ?  ? ? ?Co-evaluation   ?  ?  ?  ?  ? ? ?  ?AM-PAC PT "6 Clicks" Mobility  ?Outcome Measure Help needed turning from your back to your side while in a flat bed without using bedrails?: A Little ?Help needed moving from lying on your back to sitting on the side of a flat bed without using bedrails?: A Little ?Help needed moving to and from a bed to a chair (including a wheelchair)?: A Little ?Help needed standing up from a chair using your arms (e.g., wheelchair or bedside chair)?: A Little ?Help needed to walk in hospital room?: A Lot ?Help needed climbing 3-5 steps with a railing? : Total ?6 Click Score: 15 ? ?  ?End of Session Equipment Utilized During Treatment: Gait belt ?Activity Tolerance: Patient tolerated treatment well ?Patient left: in chair;with call bell/phone within reach;with chair alarm set ?Nurse Communication: Mobility status ?PT Visit Diagnosis: Other abnormalities of gait and mobility (R26.89);Difficulty in walking, not elsewhere classified (R26.2);Muscle weakness (generalized) (M62.81) ?  ? ?Time: 5397-6734 ?PT Time Calculation (min) (ACUTE ONLY): 29 min ? ? ?Charges:   PT Evaluation ?$PT Eval Moderate Complexity: 1 Mod ?PT Treatments ?$Therapeutic Activity: 8-22 mins ?  ?   ? ? ?Guy Hutchinson  P, PT ?Acute Rehabilitation Services ?Pager: [phone] ?Office: [phone] ? ? ?Akasha Melena B Nadira Single ?08/12/2021, 11:57 AM ? ?

## 2021-08-12 NOTE — Progress Notes (Signed)
ANTICOAGULATION CONSULT NOTE - Follow Up Consult ? ?Pharmacy Consult for IV Heparin ?Indication: atrial fibrillation ? ?No Known Allergies ? ?Patient Measurements: ?Height: '5\' 10"'$  (177.8 cm) ?Weight: 75.9 kg (167 lb 5.3 oz) ?IBW/kg (Calculated) : 73 ?Heparin Dosing Weight: 75.9 kg ? ?Vital Signs: ?Temp: 98.2 ?F (36.8 ?C) (03/17 1950) ?Temp Source: Oral (03/17 1950) ?BP: 101/61 (03/17 1950) ?Pulse Rate: 85 (03/17 1950) ? ?Labs: ?Recent Labs  ?  08/11/21 ?1521 08/11/21 ?1532 08/11/21 ?1800 08/11/21 ?2113 08/12/21 ?0107 08/12/21 ?1836  ?HGB 11.0*  --   --   --  10.6*  --   ?HCT 33.0*  --   --   --  30.5*  --   ?PLT 309  --   --   --  298  --   ?LABPROT  --   --   --  15.8*  --   --   ?INR  --   --   --  1.3*  --   --   ?HEPARINUNFRC  --   --   --   --   --  0.34  ?CREATININE 1.18  --   --   --  1.23  --   ?CKTOTAL  --  132  --   --   --   --   ?TROPONINIHS  --  255* 262*  --  218*  --   ? ? ?Estimated Creatinine Clearance: 47 mL/min (by C-G formula based on SCr of 1.23 mg/dL). ? ? ?Medications:  ?Infusions:  ? sodium chloride    ? heparin 1,000 Units/hr (08/12/21 1900)  ? ? ?Assessment: ?86 year old male with new afib. Not on anticoagulation prior to admit. Discussion ongoing with family about transition to oral anticoagulation. Pharmacy consulted for dosing of IV heparin. Received lovenox 3/16pm. CBC stable, no bleeding issues noted.  ? ?2nd: Initial heparin level is therapeutic at 0.34 on current rate. No bleeding reported.  ? ?Goal of Therapy:  ?Heparin level 0.3-0.7 units/ml ?Monitor platelets by anticoagulation protocol: Yes ?  ?Plan:  ?Continue IV Heparin at 1000 units/hr.  ?Follow-up AM Heparin level and CBC.  ? ?Sloan Leiter, PharmD, BCPS, BCCCP ?Clinical Pharmacist ?Please refer to [facility] for [place] numbers ?08/12/2021,8:22 PM ? ? ?

## 2021-08-12 NOTE — Progress Notes (Addendum)
? ?Progress Note ? ?Patient Name: Guy Hutchinson ?Date of Encounter: 08/12/2021 ? ?[facility] Cardiologist: None  ? ?Subjective  ? ?Well appearing. Lying flat. No distress ? ?Normal renal function ?Hgb 10 ? ? ?Inpatient Medications  ?  ?Scheduled Meds: ? dorzolamide-timolol  1 drop Both Eyes BID  ? enoxaparin (LOVENOX) injection  40 mg Subcutaneous N27P  ? folic acid  1 mg Oral Daily  ? latanoprost  1 drop Both Eyes QHS  ? metoprolol tartrate  25 mg Oral Q6H  ? multivitamin with minerals  1 tablet Oral Daily  ? potassium chloride  40 mEq Oral Once  ? sodium chloride flush  3 mL Intravenous Q12H  ? thiamine  100 mg Oral Daily  ? Or  ? thiamine  100 mg Intravenous Daily  ? ?Continuous Infusions: ? sodium chloride    ? ?PRN Meds: ?sodium chloride, acetaminophen **OR** acetaminophen, LORazepam **OR** LORazepam, melatonin, sodium chloride flush  ? ?Vital Signs  ?  ?Vitals:  ? 08/12/21 0522 08/12/21 0630 08/12/21 0749 08/12/21 0750  ?BP:  (!) 101/55 104/67 104/67  ?Pulse:   99 (!) 110  ?Resp:   20 (!) 21  ?Temp:   98.9 ?F (37.2 ?C)   ?TempSrc:   Oral   ?SpO2:   96% 97%  ?Weight: 75.9 kg     ?Height:      ? ? ?Intake/Output Summary (Last 24 hours) at 08/12/2021 0903 ?Last data filed at 08/11/2021 2300 ?Gross per 24 hour  ?Intake --  ?Output 1275 ml  ?Net -1275 ml  ? ?Last 3 Weights 08/12/2021 08/11/2021 04/02/2020  ?Weight (lbs) 167 lb 5.3 oz 168 lb 145 lb 12.8 oz  ?Weight (kg) 75.9 kg 76.204 kg 66.134 kg  ?   ? ?Telemetry  ?  ?Atrial fibrillation rates predominantly < 110 bpm - Personally Reviewed ? ?ECG  ?  ?No new - Personally Reviewed ? ?Physical Exam  ? ?Vitals:  ? 08/12/21 0749 08/12/21 0750  ?BP: 104/67 104/67  ?Pulse: 99 (!) 110  ?Resp: 20 (!) 21  ?Temp: 98.9 ?F (37.2 ?C)   ?SpO2: 96% 97%  ? ? ?GEN: No acute distress.   ?Neck: mild JVD ?Cardiac: RRR, RUSB SEM, no rubs or gallops.  ?Respiratory: nl wob, expiratory wheezes ?GI: Soft, nontender, non-distended  ?MS: 2+ LE edema ?Neuro:  Nonfocal  ?Psych: Normal affect   ? ?Labs  ?  ?High Sensitivity Troponin:   ?Recent Labs  ?Lab 08/11/21 ?1532 08/11/21 ?1800 08/12/21 ?0107  ?TROPONINIHS 255* 262* 218*  ?   ?Chemistry ?Recent Labs  ?Lab 08/11/21 ?1521 08/12/21 ?0107  ?NA 128* 129*  ?K 3.9 3.2*  ?CL 98 98  ?CO2 21* 22  ?GLUCOSE 130* 126*  ?BUN 21 21  ?CREATININE 1.18 1.23  ?CALCIUM 8.4* 8.3*  ?MG 2.5*  --   ?PROT 6.5  --   ?ALBUMIN 3.5  --   ?AST 35  --   ?ALT 28  --   ?ALKPHOS 96  --   ?BILITOT 2.2*  --   ?GFRNONAA >60 58*  ?ANIONGAP 9 9  ?  ?Lipids  ?Recent Labs  ?Lab 08/12/21 ?0107  ?CHOL 176  ?TRIG 88  ?HDL 26*  ?LDLCALC 132*  ?CHOLHDL 6.8  ?  ?Hematology ?Recent Labs  ?Lab 08/11/21 ?1521 08/12/21 ?0107  ?WBC 9.9 12.4*  ?RBC 3.40* 3.22*  ?HGB 11.0* 10.6*  ?HCT 33.0* 30.5*  ?MCV 97.1 94.7  ?MCH 32.4 32.9  ?MCHC 33.3 34.8  ?RDW 14.7 14.6  ?PLT 309  298  ? ?Thyroid  ?Recent Labs  ?Lab 08/11/21 ?1837  ?TSH 1.022  ?FREET4 1.54*  ?  ?BNP ?Recent Labs  ?Lab 08/11/21 ?1521  ?BNP 1,049.1*  ?  ?DDimer  ?Recent Labs  ?Lab 08/11/21 ?1532  ?DDIMER 3.91*  ?  ? ?Radiology  ?  ?DG Chest 2 View ? ?Result Date: 08/11/2021 ?CLINICAL DATA:  Shortness of breath. EXAM: CHEST - 2 VIEW COMPARISON:  Chest x-ray 07/31/2021. FINDINGS: There is a vague focal nodular density in the right upper lobe measuring 2 cm overlying the anterior right second rib lungs are otherwise clear. Cardiomediastinal silhouette is stable and within normal limits. No pleural effusion or pneumothorax. No acute fractures. IMPRESSION: 1. No evidence for pneumonia or edema. 2. Questionable nodular density in the right lung apex. Recommend follow-up nonemergent chest CT. Electronically Signed   By: Ronney Asters M.D.   On: 08/11/2021 15:42  ? ?CT HEAD WO CONTRAST (5MM) ? ?Result Date: 08/11/2021 ?CLINICAL DATA:  Weak; Head trauma, moderate-severe fall EXAM: CT HEAD WITHOUT CONTRAST CT MAXILLOFACIAL WITHOUT CONTRAST TECHNIQUE: Multidetector CT imaging of the head and maxillofacial structures were performed using the standard protocol  without intravenous contrast. Multiplanar CT image reconstructions of the maxillofacial structures were also generated. RADIATION DOSE REDUCTION: This exam was performed according to the departmental dose-optimization program which includes automated exposure control, adjustment of the mA and/or kV according to patient size and/or use of iterative reconstruction technique. COMPARISON:  CT head July 31, 2021 FINDINGS: CT HEAD FINDINGS Brain: There is no acute intracranial hemorrhage, mass effect, or edema. Gray-white differentiation is preserved. Patchy and confluent areas of low-density in the supratentorial white matter are nonspecific but probably reflects stable chronic microvascular ischemic changes. No extra-axial collection. Vascular: There is intracranial atherosclerotic calcification at the skull base. Skull: Unremarkable. Other: Mastoid air cells are clear. CT MAXILLOFACIAL FINDINGS Osseous: No acute facial fracture allowing for motion degradation. Orbits: No intraorbital hematoma. Sinuses: Aerated. Soft tissues: Residual left frontal scalp soft tissue swelling/hematoma. IMPRESSION: No evidence of acute intracranial injury. No acute facial fracture. Electronically Signed   By: Macy Mis M.D.   On: 08/11/2021 18:22  ? ?CT Angio Chest PE W and/or Wo Contrast ? ?Result Date: 08/11/2021 ?CLINICAL DATA:  Elevated D-dimer EXAM: CT ANGIOGRAPHY CHEST WITH CONTRAST TECHNIQUE: Multidetector CT imaging of the chest was performed using the standard protocol during bolus administration of intravenous contrast. Multiplanar CT image reconstructions and MIPs were obtained to evaluate the vascular anatomy. RADIATION DOSE REDUCTION: This exam was performed according to the departmental dose-optimization program which includes automated exposure control, adjustment of the mA and/or kV according to patient size and/or use of iterative reconstruction technique. CONTRAST:  82m OMNIPAQUE IOHEXOL 350 MG/ML SOLN COMPARISON:   None. FINDINGS: Cardiovascular: Cardiomegaly. Diffuse coronary artery and aortic calcifications. No aneurysm. No filling defects in the pulmonary arteries to suggest pulmonary emboli. Mediastinum/Nodes: No mediastinal, hilar, or axillary adenopathy. Trachea and esophagus are unremarkable. Thyroid unremarkable. Lungs/Pleura: Linear atelectasis or scarring in the lung bases. No confluent opacities or effusions. Upper Abdomen: Imaging into the upper abdomen demonstrates no acute findings. Musculoskeletal: Chest wall soft tissues are unremarkable. No acute bony abnormality. Review of the MIP images confirms the above findings. IMPRESSION: No evidence of pulmonary embolus. Diffuse coronary artery disease. Bibasilar scarring or atelectasis. No acute cardiopulmonary disease. Aortic Atherosclerosis (ICD10-I70.0). Electronically Signed   By: KRolm BaptiseM.D.   On: 08/11/2021 18:22  ? ?CT Cervical Spine Wo Contrast ? ?Result Date: 08/11/2021 ?CLINICAL DATA:  Trauma. EXAM: CT CERVICAL SPINE WITHOUT CONTRAST TECHNIQUE: Multidetector CT imaging of the cervical spine was performed without intravenous contrast. Multiplanar CT image reconstructions were also generated. RADIATION DOSE REDUCTION: This exam was performed according to the departmental dose-optimization program which includes automated exposure control, adjustment of the mA and/or kV according to patient size and/or use of iterative reconstruction technique. COMPARISON:  Cervical spine CT 07/31/2021. FINDINGS: Alignment: Alignment is stable. There is 3 mm of anterolisthesis at C4-C5. Skull base and vertebrae: No acute fracture. No primary bone lesion or focal pathologic process. Soft tissues and spinal canal: No prevertebral fluid or swelling. No visible canal hematoma. Disc levels: Disc space narrowing and endplate osteophyte formation throughout the cervical spine appear similar to the prior study. There are marked degenerative changes of bilateral facet joints, also  similar to the prior study. Multilevel neural foraminal stenosis has not significantly changed. There is stable moderate central canal stenosis at C3-C4. Upper chest: Negative. Other: There are atherosc

## 2021-08-12 NOTE — TOC Benefit Eligibility Note (Signed)
Patient Advocate Encounter ?  ?Insurance verification completed.   ?  ?The patient is currently admitted and upon discharge could be taking ELIQUIS '5MG'$ . ?  ?The current 30 day co-pay is, $47.  ? ?The patient is insured through [employer]. ? ? ?  ? ?

## 2021-08-12 NOTE — Assessment & Plan Note (Addendum)
-   Supplemented ?- Monitor  ?

## 2021-08-13 DIAGNOSIS — I359 Nonrheumatic aortic valve disorder, unspecified: Secondary | ICD-10-CM

## 2021-08-13 DIAGNOSIS — N179 Acute kidney failure, unspecified: Secondary | ICD-10-CM

## 2021-08-13 LAB — COMPREHENSIVE METABOLIC PANEL
ALT: 21 U/L (ref 0–44)
AST: 22 U/L (ref 15–41)
Albumin: 2.8 g/dL — ABNORMAL LOW (ref 3.5–5.0)
Alkaline Phosphatase: 79 U/L (ref 38–126)
Anion gap: 12 (ref 5–15)
BUN: 34 mg/dL — ABNORMAL HIGH (ref 8–23)
CO2: 19 mmol/L — ABNORMAL LOW (ref 22–32)
Calcium: 8 mg/dL — ABNORMAL LOW (ref 8.9–10.3)
Chloride: 98 mmol/L (ref 98–111)
Creatinine, Ser: 1.62 mg/dL — ABNORMAL HIGH (ref 0.61–1.24)
GFR, Estimated: 42 mL/min — ABNORMAL LOW (ref >60)
Glucose, Bld: 103 mg/dL — ABNORMAL HIGH (ref 70–99)
Potassium: 3.7 mmol/L (ref 3.5–5.1)
Sodium: 129 mmol/L — ABNORMAL LOW (ref 135–145)
Total Bilirubin: 2.3 mg/dL — ABNORMAL HIGH (ref 0.3–1.2)
Total Protein: 5.6 g/dL — ABNORMAL LOW (ref 6.5–8.1)

## 2021-08-13 LAB — HEPARIN LEVEL (UNFRACTIONATED)
Heparin Unfractionated: 0.27 IU/mL — ABNORMAL LOW (ref 0.30–0.70)
Heparin Unfractionated: 0.2 IU/mL — ABNORMAL LOW (ref 0.30–0.70)
Heparin Unfractionated: 0.35 IU/mL (ref 0.30–0.70)

## 2021-08-13 LAB — CBC
HCT: 27.8 % — ABNORMAL LOW (ref 39.0–52.0)
Hemoglobin: 9.5 g/dL — ABNORMAL LOW (ref 13.0–17.0)
MCH: 32.3 pg (ref 26.0–34.0)
MCHC: 34.2 g/dL (ref 30.0–36.0)
MCV: 94.6 fL (ref 80.0–100.0)
Platelets: 258 10*3/uL (ref 150–400)
RBC: 2.94 MIL/uL — ABNORMAL LOW (ref 4.22–5.81)
RDW: 14.9 % (ref 11.5–15.5)
WBC: 13.4 10*3/uL — ABNORMAL HIGH (ref 4.0–10.5)
nRBC: 0 % (ref 0.0–0.2)

## 2021-08-13 LAB — MAGNESIUM: Magnesium: 2.1 mg/dL (ref 1.7–2.4)

## 2021-08-13 MED ORDER — POTASSIUM CHLORIDE CRYS ER 20 MEQ PO TBCR
30.0000 meq | EXTENDED_RELEASE_TABLET | Freq: Once | ORAL | Status: AC
  Administered 2021-08-13: 30 meq via ORAL
  Filled 2021-08-13: qty 1

## 2021-08-13 MED ORDER — FUROSEMIDE 40 MG PO TABS
40.0000 mg | ORAL_TABLET | Freq: Every day | ORAL | Status: DC
  Administered 2021-08-13 – 2021-08-16 (×4): 40 mg via ORAL
  Filled 2021-08-13 (×4): qty 1

## 2021-08-13 MED ORDER — HEPARIN BOLUS VIA INFUSION
1100.0000 [IU] | Freq: Once | INTRAVENOUS | Status: AC
  Administered 2021-08-13: 1100 [IU] via INTRAVENOUS
  Filled 2021-08-13: qty 1100

## 2021-08-13 NOTE — Progress Notes (Signed)
ANTICOAGULATION CONSULT NOTE - Follow Up Consult ? ?Pharmacy Consult for IV heparin ?Indication: atrial fibrillation ? ?No Known Allergies ? ?Patient Measurements: ?Height: '5\' 10"'$  (177.8 cm) ?Weight: 76.1 kg (167 lb 12.3 oz) ?IBW/kg (Calculated) : 73 ?Heparin Dosing Weight: 76 kg ? ?Vital Signs: ?Temp: 97.3 ?F (36.3 ?C) (03/18 2036) ?Temp Source: Oral (03/18 2036) ?BP: 109/72 (03/18 2036) ?Pulse Rate: 79 (03/18 2036) ? ?Labs: ?Recent Labs  ?  08/11/21 ?1521 08/11/21 ?1532 08/11/21 ?1800 08/11/21 ?2113 08/12/21 ?0107 08/12/21 ?1836 08/13/21 ?0128 08/13/21 ?1305 08/13/21 ?2124  ?HGB 11.0*  --   --   --  10.6*  --  9.5*  --   --   ?HCT 33.0*  --   --   --  30.5*  --  27.8*  --   --   ?PLT 309  --   --   --  298  --  258  --   --   ?LABPROT  --   --   --  15.8*  --   --   --   --   --   ?INR  --   --   --  1.3*  --   --   --   --   --   ?HEPARINUNFRC  --   --   --   --   --    < > 0.27* 0.20* 0.35  ?CREATININE 1.18  --   --   --  1.23  --  1.62*  --   --   ?CKTOTAL  --  132  --   --   --   --   --   --   --   ?TROPONINIHS  --  255* 262*  --  218*  --   --   --   --   ? < > = values in this interval not displayed.  ? ? ? ?Estimated Creatinine Clearance: 35.7 mL/min (A) (by C-G formula based on SCr of 1.62 mg/dL (H)). ? ? ?Assessment: ?86 year old male with new afib. Not on anticoagulation prior to admit. Discussion ongoing with family about transition to oral anticoagulation. Pharmacy consulted for dosing of IV heparin. Received lovenox 3/16pm. ? ?Heparin level came back therapeutic at 0.35, on 1300 units/hr. No s/sx of bleeding or infusion issues.   ? ?Goal of Therapy:  ?Heparin level 0.3-0.7 units/ml ?Monitor platelets by anticoagulation protocol: Yes ?  ?Plan:  ?Increase heparin infusion to 1350 units/hr to keep in goal range ?Check anti-Xa level daily while on heparin ?Continue to monitor H&H and platelets ? ?Antonietta Jewel, PharmD, BCCCP ?Clinical Pharmacist  ?Phone: [phone] ?08/13/2021 10:24 PM ? ?Please check  AMION for all [facility] phone numbers ?After 10:00 PM, call [place] [phone] ? ? ? ? ?

## 2021-08-13 NOTE — Assessment & Plan Note (Addendum)
Echo reveals moderate AR, severe AS. Suspect this is symptomatic/cause of lightheadedness and falls. Pt/family decline consideration for TAVR. ?

## 2021-08-13 NOTE — TOC Initial Note (Signed)
Transition of Care (TOC) - Initial/Assessment Note  ? ? ?Patient Details  ?Name: Guy Hutchinson ?MRN: [id number] ?Date of Birth: 1936/02/04 ? ?Transition of Care (TOC) CM/SW Contact:    ?Bary Castilla, LCSW ?Phone [phone] ?08/13/2021, 10:49 AM ? ?Clinical Narrative:                 ? ?CSW met with patient to discuss PT recommendation of a SNF. Patient was aware of recommendation and in agreement with going to a [facility] SNF. CSW discussed the SNF process.CSW provided patient with medicare.gov rating list.  Patient gave CSW permission to fax referrals out to local facilities.CSW answered questions about the SNF process and the next steps in the process.Pt stated that he has been vaccinated and he has been to [facility] before and is willing to return if a bed offer is made. ? ?TOC team will continue to assist with discharge planning needs.   ? ?  ? ?Expected Discharge Plan: [place] ?Barriers to Discharge: Ship broker, Continued Medical Work up, SNF Pending bed offer ? ? ?Patient Goals and CMS Choice ?  ?CMS Medicare.gov Compare Post Acute Care list provided to:: Patient ?Choice offered to / list presented to : Patient ? ?Expected Discharge Plan and Services ?Expected Discharge Plan: [place] ?  ?  ?  ?Living arrangements for the past 2 months: [place] ?                ?  ?  ?  ?  ?  ?  ?  ?  ?  ?  ? ?Prior Living Arrangements/Services ?Living arrangements for the past 2 months: [place] ?Lives with:: Self, Adult Children ?Patient language and need for interpreter reviewed:: Yes ?Do you feel safe going back to the place where you live?: Yes      ?  ?Care giver support system in place?: Yes (comment) ?  ?  ? ?Activities of Daily Living ?Home Assistive Devices/Equipment: Cane (specify quad or straight) ?ADL Screening (condition at time of admission) ?Patient's cognitive ability adequate to safely complete daily activities?: Yes ?Is the patient  deaf or have difficulty hearing?: Yes ?Does the patient have difficulty seeing, even when wearing glasses/contacts?: No ?Does the patient have difficulty concentrating, remembering, or making decisions?: Yes ?Patient able to express need for assistance with ADLs?: Yes ?Does the patient have difficulty dressing or bathing?: Yes ?Independently performs ADLs?: Yes (appropriate for developmental age) ?Does the patient have difficulty walking or climbing stairs?: Yes ?Weakness of Legs: Both ?Weakness of Arms/Hands: Both ? ?Permission Sought/Granted ?  ?Permission granted to share information with : Yes, Verbal Permission Granted ? Share Information with NAME: Lester Crickenberger ? Permission granted to share info w AGENCY: SNFs ? Permission granted to share info w Relationship: Son ? Permission granted to share info w Contact Information: [phone] ? ?Emotional Assessment ?Appearance:: Appears younger than stated age ?Attitude/Demeanor/Rapport: Engaged ?Affect (typically observed): Accepting, Adaptable ?Orientation: : Oriented to Self, Oriented to Place, Oriented to  Time, Oriented to Situation ?  ?  ? ?Admission diagnosis:  Atrial fibrillation ([place]) [I48.91] ?Elevated troponin [R77.8] ?Hypervolemia, unspecified hypervolemia type [E87.70] ?Atrial fibrillation, unspecified type ([place]) [I48.91] ?Acute CHF ([place]) [I50.9] ?Patient Active Problem List  ? Diagnosis Date Noted  ? Acute CHF ([place]) 08/12/2021  ? Hypokalemia 08/12/2021  ? Hyperbilirubinemia 08/12/2021  ? Pure hypercholesterolemia 08/11/2021  ? Atrial fibrillation ([place]) 08/11/2021  ? Hyponatremia 08/11/2021  ? Bilateral lower  extremity edema 08/11/2021  ? Elevated troponin 08/11/2021  ? Baker's cyst of knee, right 08/11/2021  ? Alcohol use 08/11/2021  ? Hypervolemia   ? Squamous cell carcinoma of scalp 10/19/2017  ? Dizziness 04/11/2017  ? Hearing impaired 10/22/2014  ? ?PCP:  Wendie Agreste, MD ?Pharmacy:   ?[id number] [address] [place]  [address] ?[place] ?[place] [address] ?Phone: [phone] Fax: [phone] ? ?[id number] [place] [address] [address] [place]?Phone: [phone]: [phone]? ? ? ? ?Social Determinants of Health (SDOH) Interventions ?  ? ?Readmission Risk Interventions ?No flowsheet data found. ? ? ?

## 2021-08-13 NOTE — Progress Notes (Signed)
ANTICOAGULATION CONSULT NOTE - Follow Up Consult ? ?Pharmacy Consult for IV heparin ?Indication: atrial fibrillation ? ?No Known Allergies ? ?Patient Measurements: ?Height: '5\' 10"'$  (177.8 cm) ?Weight: 76.1 kg (167 lb 12.3 oz) ?IBW/kg (Calculated) : 73 ?Heparin Dosing Weight: 76 kg ? ?Vital Signs: ?Temp: 97.6 ?F (36.4 ?C) (03/18 1121) ?Temp Source: Oral (03/18 1121) ?BP: 110/63 (03/18 1121) ?Pulse Rate: 86 (03/18 1121) ? ?Labs: ?Recent Labs  ?  08/11/21 ?1521 08/11/21 ?1532 08/11/21 ?1800 08/11/21 ?2113 08/12/21 ?0107 08/12/21 ?1836 08/13/21 ?0128 08/13/21 ?1305  ?HGB 11.0*  --   --   --  10.6*  --  9.5*  --   ?HCT 33.0*  --   --   --  30.5*  --  27.8*  --   ?PLT 309  --   --   --  298  --  258  --   ?LABPROT  --   --   --  15.8*  --   --   --   --   ?INR  --   --   --  1.3*  --   --   --   --   ?HEPARINUNFRC  --   --   --   --   --  0.34 0.27* 0.20*  ?CREATININE 1.18  --   --   --  1.23  --  1.62*  --   ?CKTOTAL  --  132  --   --   --   --   --   --   ?TROPONINIHS  --  255* 262*  --  218*  --   --   --   ? ? ? ?Estimated Creatinine Clearance: 35.7 mL/min (A) (by C-G formula based on SCr of 1.62 mg/dL (H)). ? ? ?Assessment: ?86 year old male with new afib. Not on anticoagulation prior to admit. Discussion ongoing with family about transition to oral anticoagulation. Pharmacy consulted for dosing of IV heparin. Received lovenox 3/16pm. ? ?Heparin level returned subtherapeutic at 0.2 on 1150 units/hr. Per RN no issues with heparin infusion, no s/sx of bleeding. Hgb down from 10.6 to 9.5 and plts stable. Patient currently in  AKI Creatinine 1.62 (1.18 BL).  ? ?Goal of Therapy:  ?Heparin level 0.3-0.7 units/ml ?Monitor platelets by anticoagulation protocol: Yes ?  ?Plan:  ?Bolus 1100 units heparin ?Increase heparin infusion to 1300 units/hr ?Check anti-Xa level in 8 hours and daily while on heparin ?Continue to monitor H&H and platelets ? ?Cathrine Muster, PharmD ?PGY2 Cardiology Pharmacy Resident ?Phone:  [phone] ?08/13/2021  2:25 PM ? ?Please check [email or web address] for unit-specific pharmacy phone numbers. ? ? ? ?

## 2021-08-13 NOTE — Progress Notes (Signed)
? ?Progress Note ? ?Patient Name: Guy Hutchinson ?Date of Encounter: 08/13/2021 ? ?[facility] Cardiologist: None  ? ?Subjective  ? ?Guy Hutchinson feels well this AM. No issues ? ?AKI 1.2-> 1.6 ?Hgb 9.5 ? ?Weight 168->167 ? ?Wt Readings from Last 3 Encounters:  ?08/13/21 76.1 kg  ?04/02/20 66.1 kg  ?03/10/20 65.3 kg  ? ?Net negative 400 cc. Total net negative 1.7L. on lasix 60 mg IV  ? ?Inpatient Medications  ?  ?Scheduled Meds: ? cholecalciferol  1,000 Units Oral QHS  ? dorzolamide-timolol  1 drop Both Eyes BID  ? folic acid  1 mg Oral Daily  ? latanoprost  1 drop Both Eyes QHS  ? metoprolol tartrate  25 mg Oral Q6H  ? multivitamin with minerals  1 tablet Oral Daily  ? sodium chloride flush  3 mL Intravenous Q12H  ? thiamine  100 mg Oral Daily  ? Or  ? thiamine  100 mg Intravenous Daily  ? ?Continuous Infusions: ? sodium chloride    ? heparin 1,150 Units/hr (08/13/21 0740)  ? ?PRN Meds: ?sodium chloride, acetaminophen **OR** acetaminophen, LORazepam **OR** LORazepam, melatonin, sodium chloride flush  ? ?Vital Signs  ?  ?Vitals:  ? 08/13/21 0321 08/13/21 0441 08/13/21 0631 08/13/21 0752  ?BP: (!) 99/59  106/64 107/63  ?Pulse: 91  88 85  ?Resp: '19 17  20  '$ ?Temp: 98.1 ?F (36.7 ?C)   98 ?F (36.7 ?C)  ?TempSrc: Oral   Oral  ?SpO2: 98%   96%  ?Weight:  76.1 kg    ?Height:      ? ? ?Intake/Output Summary (Last 24 hours) at 08/13/2021 0843 ?Last data filed at 08/13/2021 0442 ?Gross per 24 hour  ?Intake 165.8 ml  ?Output 600 ml  ?Net -434.2 ml  ? ?Last 3 Weights 08/13/2021 08/12/2021 08/11/2021  ?Weight (lbs) 167 lb 12.3 oz 167 lb 5.3 oz 168 lb  ?Weight (kg) 76.1 kg 75.9 kg 76.204 kg  ?   ? ?Telemetry  ?  ?Atrial fibrillation rates predominantly < 110 bpm - Personally Reviewed ? ?ECG  ?  ?No new - Personally Reviewed ? ?Physical Exam  ? ?Vitals:  ? 08/13/21 0631 08/13/21 0752  ?BP: 106/64 107/63  ?Pulse: 88 85  ?Resp:  20  ?Temp:  98 ?F (36.7 ?C)  ?SpO2:  96%  ? ? ?GEN: No acute distress.   ?Neck: mild JVD ?Cardiac: RRR, RUSB  SEM, no rubs or gallops.  ?Respiratory: nl wob, expiratory wheezes ?GI: Soft, nontender, non-distended  ?MS: 2+ LE edema ?Neuro:  Nonfocal  ?Psych: Normal affect  ? ?Labs  ?  ?High Sensitivity Troponin:   ?Recent Labs  ?Lab 08/11/21 ?1532 08/11/21 ?1800 08/12/21 ?0107  ?TROPONINIHS 255* 262* 218*  ?   ?Chemistry ?Recent Labs  ?Lab 08/11/21 ?1521 08/12/21 ?0107 08/13/21 ?0128  ?NA 128* 129* 129*  ?K 3.9 3.2* 3.7  ?CL 98 98 98  ?CO2 21* 22 19*  ?GLUCOSE 130* 126* 103*  ?BUN 21 21 34*  ?CREATININE 1.18 1.23 1.62*  ?CALCIUM 8.4* 8.3* 8.0*  ?MG 2.5*  --  2.1  ?PROT 6.5  --  5.6*  ?ALBUMIN 3.5  --  2.8*  ?AST 35  --  22  ?ALT 28  --  21  ?ALKPHOS 96  --  79  ?BILITOT 2.2*  --  2.3*  ?GFRNONAA >60 58* 42*  ?ANIONGAP '9 9 12  '$ ?  ?Lipids  ?Recent Labs  ?Lab 08/12/21 ?0107  ?CHOL 176  ?TRIG 88  ?HDL  26*  ?LDLCALC 132*  ?CHOLHDL 6.8  ?  ?Hematology ?Recent Labs  ?Lab 08/11/21 ?1521 08/12/21 ?0107 08/13/21 ?0128  ?WBC 9.9 12.4* 13.4*  ?RBC 3.40* 3.22* 2.94*  ?HGB 11.0* 10.6* 9.5*  ?HCT 33.0* 30.5* 27.8*  ?MCV 97.1 94.7 94.6  ?MCH 32.4 32.9 32.3  ?MCHC 33.3 34.8 34.2  ?RDW 14.7 14.6 14.9  ?PLT 309 298 258  ? ?Thyroid  ?Recent Labs  ?Lab 08/11/21 ?1837  ?TSH 1.022  ?FREET4 1.54*  ?  ?BNP ?Recent Labs  ?Lab 08/11/21 ?1521  ?BNP 1,049.1*  ?  ?DDimer  ?Recent Labs  ?Lab 08/11/21 ?1532  ?DDIMER 3.91*  ?  ? ?Radiology  ?  ?DG Chest 2 View ? ?Result Date: 08/11/2021 ?CLINICAL DATA:  Shortness of breath. EXAM: CHEST - 2 VIEW COMPARISON:  Chest x-ray 07/31/2021. FINDINGS: There is a vague focal nodular density in the right upper lobe measuring 2 cm overlying the anterior right second rib lungs are otherwise clear. Cardiomediastinal silhouette is stable and within normal limits. No pleural effusion or pneumothorax. No acute fractures. IMPRESSION: 1. No evidence for pneumonia or edema. 2. Questionable nodular density in the right lung apex. Recommend follow-up nonemergent chest CT. Electronically Signed   By: Ronney Asters M.D.   On:  08/11/2021 15:42  ? ?CT HEAD WO CONTRAST (5MM) ? ?Result Date: 08/11/2021 ?CLINICAL DATA:  Weak; Head trauma, moderate-severe fall EXAM: CT HEAD WITHOUT CONTRAST CT MAXILLOFACIAL WITHOUT CONTRAST TECHNIQUE: Multidetector CT imaging of the head and maxillofacial structures were performed using the standard protocol without intravenous contrast. Multiplanar CT image reconstructions of the maxillofacial structures were also generated. RADIATION DOSE REDUCTION: This exam was performed according to the departmental dose-optimization program which includes automated exposure control, adjustment of the mA and/or kV according to patient size and/or use of iterative reconstruction technique. COMPARISON:  CT head July 31, 2021 FINDINGS: CT HEAD FINDINGS Brain: There is no acute intracranial hemorrhage, mass effect, or edema. Gray-white differentiation is preserved. Patchy and confluent areas of low-density in the supratentorial white matter are nonspecific but probably reflects stable chronic microvascular ischemic changes. No extra-axial collection. Vascular: There is intracranial atherosclerotic calcification at the skull base. Skull: Unremarkable. Other: Mastoid air cells are clear. CT MAXILLOFACIAL FINDINGS Osseous: No acute facial fracture allowing for motion degradation. Orbits: No intraorbital hematoma. Sinuses: Aerated. Soft tissues: Residual left frontal scalp soft tissue swelling/hematoma. IMPRESSION: No evidence of acute intracranial injury. No acute facial fracture. Electronically Signed   By: Macy Mis M.D.   On: 08/11/2021 18:22  ? ?CT Angio Chest PE W and/or Wo Contrast ? ?Result Date: 08/11/2021 ?CLINICAL DATA:  Elevated D-dimer EXAM: CT ANGIOGRAPHY CHEST WITH CONTRAST TECHNIQUE: Multidetector CT imaging of the chest was performed using the standard protocol during bolus administration of intravenous contrast. Multiplanar CT image reconstructions and MIPs were obtained to evaluate the vascular anatomy.  RADIATION DOSE REDUCTION: This exam was performed according to the departmental dose-optimization program which includes automated exposure control, adjustment of the mA and/or kV according to patient size and/or use of iterative reconstruction technique. CONTRAST:  70m OMNIPAQUE IOHEXOL 350 MG/ML SOLN COMPARISON:  None. FINDINGS: Cardiovascular: Cardiomegaly. Diffuse coronary artery and aortic calcifications. No aneurysm. No filling defects in the pulmonary arteries to suggest pulmonary emboli. Mediastinum/Nodes: No mediastinal, hilar, or axillary adenopathy. Trachea and esophagus are unremarkable. Thyroid unremarkable. Lungs/Pleura: Linear atelectasis or scarring in the lung bases. No confluent opacities or effusions. Upper Abdomen: Imaging into the upper abdomen demonstrates no acute findings. Musculoskeletal: Chest wall soft  tissues are unremarkable. No acute bony abnormality. Review of the MIP images confirms the above findings. IMPRESSION: No evidence of pulmonary embolus. Diffuse coronary artery disease. Bibasilar scarring or atelectasis. No acute cardiopulmonary disease. Aortic Atherosclerosis (ICD10-I70.0). Electronically Signed   By: Rolm Baptise M.D.   On: 08/11/2021 18:22  ? ?CT Cervical Spine Wo Contrast ? ?Result Date: 08/11/2021 ?CLINICAL DATA:  Trauma. EXAM: CT CERVICAL SPINE WITHOUT CONTRAST TECHNIQUE: Multidetector CT imaging of the cervical spine was performed without intravenous contrast. Multiplanar CT image reconstructions were also generated. RADIATION DOSE REDUCTION: This exam was performed according to the departmental dose-optimization program which includes automated exposure control, adjustment of the mA and/or kV according to patient size and/or use of iterative reconstruction technique. COMPARISON:  Cervical spine CT 07/31/2021. FINDINGS: Alignment: Alignment is stable. There is 3 mm of anterolisthesis at C4-C5. Skull base and vertebrae: No acute fracture. No primary bone lesion or  focal pathologic process. Soft tissues and spinal canal: No prevertebral fluid or swelling. No visible canal hematoma. Disc levels: Disc space narrowing and endplate osteophyte formation throughout the cervical spin

## 2021-08-13 NOTE — Plan of Care (Signed)
?  Problem: Education: ?Goal: Knowledge of disease or condition will improve ?Outcome: Progressing ?  ?Problem: Activity: ?Goal: Ability to tolerate increased activity will improve ?Outcome: Progressing ?  ?Problem: Clinical Measurements: ?Goal: Cardiovascular complication will be avoided ?Outcome: Progressing ?  ?Problem: Activity: ?Goal: Risk for activity intolerance will decrease ?Outcome: Progressing ?  ?Problem: Skin Integrity: ?Goal: Risk for impaired skin integrity will decrease ?Outcome: Progressing ?  ?

## 2021-08-13 NOTE — Assessment & Plan Note (Addendum)
Due to diuresis which we've deescalated with subsequent stabilization.  ?- Monitor in AM, avoiding nephrotoxins.  ?

## 2021-08-13 NOTE — Plan of Care (Signed)
  Problem: Activity: Goal: Ability to tolerate increased activity will improve Outcome: Progressing   Problem: Cardiac: Goal: Ability to achieve and maintain adequate cardiopulmonary perfusion will improve Outcome: Progressing   

## 2021-08-13 NOTE — Progress Notes (Signed)
?Progress Note ? ?Patient: Guy Hutchinson VHQ:[id number] DOB: Mar 11, 1936  ?DOA: 08/11/2021  DOS: 08/13/2021  ?  ?Brief hospital course: ?Guy Hutchinson is an 86 y.o. male with a history of HLD, prostate CA, recurrent falls, daily alcohol use who presented to the ED 3/16 with leg swelling, dyspnea worse with exertion, and worsening weakness and falls. He appeared peripherally volume overloaded consistent with CHF, and with new atrial fibrillation. IV diuresis, metoprolol, IV heparin were given, and cardiology consult pursued. Echo reveals aortic valve disease. ? ?Assessment and Plan: ?* Atrial fibrillation ([place]) ?New diagnosis.  ?- Continue metoprolol '25mg'$  po q6h, will convert to succinate closer to discharge ?- Continue cardiac monitoring.  ?- Risk of fall with severe bleeding on anticoagulation appears to exceed the relative risk reduction in stroke for this patient long term. Will continue IV heparin and continue family discussions. Guy Hutchinson in agreement with this plan, if no TAVR, unlikely to continue anticoagulation. ?- Maintain K and Mg levels ? ?Elevated troponin ?Troponin 255>265 without chest pain or ischemic ST changes on ECG. Pt has extensive coronary calcifications on CT, but presentation is not consistent with ACS at this time.  ?- Cardiology not currently planning intervention/work up as inpatient. ?- Continue BB, statin, anticoagulation as above. Antiplatelet medication is reasonable if not on anticoagulation. ? ?Hyponatremia ?Likely hypervolemia hyponatremia in setting of new afib with volume overload. Improved with diuresis thus far.  ?- Continue monitoring with lasix. ? ?Bilateral lower extremity edema ?BNP >1000, echo pending. Edema is likely multifactorial inclusive of venous stasis based on hyperpigmentation (in addition to bruising). No DVT on U/S.  ?- WOC consulted for weeping wounds. ? ?Baker's cyst of knee, right ?Follow up outpatient  ? ?Pure hypercholesterolemia ?LDL 132, HDL 23.  ?- Given  extensive coronary calcifications, would qualify for statin. LFTs wnl. ? ?Aortic valve disease ?Echo reveals moderate AR, severe AS. Suspect this is symptomatic/cause of lightheadedness and falls.  ?- Structural heart team to evaluate for TAVR candidacy 3/20. ? ?AKI (acute kidney injury) ([place]) ?Due to diuresis which we've deescalated.  ?- Monitor in AM, avoiding nephrotoxins.  ? ?Hyperbilirubinemia ?Unclear etiology with otherwise normal LFTs, no RUQ tenderness. ?Gilbert's ?- Check fractionated bili. ? ?Hypokalemia ?- Supplemented ?- Monitor  ? ?Acute heart failure with preserved ejection fraction (HFpEF) ([place]) ?LVEF 60-65%, mod LVH with basal-inferior hypokinesis, indeterminate diastolic function (echo during atrial fibrillation), dilated IVC.  ?- Continue diuretic and monitoring UOP, weights.  ?- Cardiac diet ?- Note wt up at 76kg from priors in 2021 in mid 60's, though no orthopnea/pulmonary edema currently evident. ? ? ?Alcohol use ?Daily use, unclear quantity.  ?- Continue on CIWA protocol. No evidence of withdrawal yet. If none in next 24 hours, can DC CIWA ? ?Subjective: No complaints. Laying flat, denies dyspnea or chest pain. Leg swelling about the same.  ? ?Objective: ?Vitals:  ? 08/13/21 0441 08/13/21 0631 08/13/21 0752 08/13/21 1121  ?BP:  106/64 107/63 110/63  ?Pulse:  88 85 86  ?Resp: '17  20 17  '$ ?Temp:   98 ?F (36.7 ?C) 97.6 ?F (36.4 ?C)  ?TempSrc:   Oral Oral  ?SpO2:   96% 94%  ?Weight: 76.1 kg     ?Height:      ? ?Gen: 86 y.o. male in no distress ?Pulm: Nonlabored breathing room air. Clear. ?CV: Irreg with systolic and diastolic murmur, no rub, or gallop. No JVD, stable dependent edema. ?GI: Abdomen soft, non-tender, non-distended, with normoactive bowel sounds.  ?Ext: Warm, no deformities ?  Skin: No new rashes, lesions or ulcers on visualized skin. ?Neuro: Alert and oriented. No focal neurological deficits. ?Psych: Judgement and insight appear fair. Mood euthymic & affect congruent. Behavior is  appropriate.   ? ?Data Personally reviewed: ?Hgb: 11, sodium: 128, bnp: 1049, troponin 255>262 ?D-dimer: 3.91 ?TSH 1.022 ?LDL 132, HDL 26 ?CT head/face: no acute finding ?Ct cervical spine: no acute finding ?CTA chest: no PE, diffuse coronary artery disease ?CXR: no edema. ? Nodular density in right lung apex.  ?Doppler: no DVT ?ECHO 08/12/2021 ?1. Left ventricular ejection fraction, by estimation, is 60 to 65%. The left ventricle has normal function. The left ventricle demonstrates regional wall motion abnormalities with basal inferior hypokinesis. There is moderate left ventricular hypertrophy. Left ventricular diastolic parameters are indeterminate.   ?2. Right ventricular systolic function is normal. The right ventricular size is normal. Tricuspid regurgitation signal is inadequate for assessing PA pressure.   ?3. Left atrial size was mildly dilated.   ?4. The mitral valve is normal in structure. Trivial mitral valve regurgitation. No evidence of mitral stenosis.   ?5. The aortic valve is tricuspid. There is severe calcifcation of the aortic valve. Aortic valve regurgitation is moderate. Severe aortic valve stenosis. Aortic valve mean gradient measures 58.0 mmHg.   ?6. The inferior vena cava is dilated in size with >50% respiratory variability, suggesting right atrial pressure of 8 mmHg.   ?7. The patient was in atrial fibrillation. ? ?Family Communication: None at bedside. Called and spoke with Guy Hutchinson by phone. ? ?Disposition: ?Status is: Inpatient ?Remains inpatient appropriate because: Needs monitored diuresis and rate control with new/high risk anticoagulation and evaluation for TAVR. ?Planned Discharge Destination: Skilled nursing facility ? ?Patrecia Pour, MD ?08/13/2021 2:26 PM ?Page by [email or web address]  ?

## 2021-08-13 NOTE — Progress Notes (Signed)
Mobility Specialist Progress Note ? ? 08/13/21 1736  ?Mobility  ?Activity Turned to right side;Turned to left side;Moved into chair position in bed  ?Level of Assistance +2 (takes two people)  ?Assistive Device  ?(HHA)  ?Activity Response Tolerated well  ?$Mobility charge 1 Mobility  ? ?NT requesting assistance to shift pt up in bed, upon arrival pt had soiled themselves and needed to be cleaned. Assisted RN and NT in getting pt clean and new linens. Then shift pt up in chair position to eat dinner. Left w/ NT still in room.  ? ?Holland Falling ?Mobility Specialist ?Phone Number [phone] ? ?

## 2021-08-13 NOTE — Assessment & Plan Note (Addendum)
LVEF 60-65%, mod LVH with basal-inferior hypokinesis, indeterminate diastolic function (echo during atrial fibrillation), dilated IVC.  ?- Continue oral diuretic and monitoring UOP, weights.  ?- Cardiac diet ?- Note wt up at 76kg from priors in 2021 in mid 60's, though no orthopnea/pulmonary edema currently evident. ? ?

## 2021-08-13 NOTE — Progress Notes (Signed)
RN made aware by Tele of a 2.34 second pause at 1608. Pt in afib and asymptomatic.  MD notified ?

## 2021-08-13 NOTE — [place] FL2 (Signed)
?[place] MEDICAID FL2 LEVEL OF CARE SCREENING TOOL  ?  ? ?IDENTIFICATION  ?Patient Name: ?Guy Hutchinson Birthdate: 1936/01/25 Sex: male Admission Date (Current Location): ?08/11/2021  ?[place] and [place] Number: ? [place] ?  Facility and Address:  ?[facility], [place] [address], [place], [place] [address] ?     Provider Number: ?[id number]  ?Attending Physician Name and Address:  ?Patrecia Pour, MD ? Relative Name and Phone Number:  ?Shavon [phone] ?   ?Current Level of Care: ?Hospital Recommended Level of Care: ?[place] Prior Approval Number: ?  ? ?Date Approved/Denied: ?  PASRR Number: ?[id number] A ? ?Discharge Plan: ?SNF ?  ? ?Current Diagnoses: ?Patient Active Problem List  ? Diagnosis Date Noted  ? Acute CHF ([place]) 08/12/2021  ? Hypokalemia 08/12/2021  ? Hyperbilirubinemia 08/12/2021  ? Pure hypercholesterolemia 08/11/2021  ? Atrial fibrillation ([place]) 08/11/2021  ? Hyponatremia 08/11/2021  ? Bilateral lower extremity edema 08/11/2021  ? Elevated troponin 08/11/2021  ? Baker's cyst of knee, right 08/11/2021  ? Alcohol use 08/11/2021  ? Hypervolemia   ? Squamous cell carcinoma of scalp 10/19/2017  ? Dizziness 04/11/2017  ? Hearing impaired 10/22/2014  ? ? ?Orientation RESPIRATION BLADDER Height & Weight   ?  ?Self, Time, Situation, Place ? Normal Continent, External catheter Weight: 167 lb 12.3 oz (76.1 kg) ?Height:  '5\' 10"'$  (177.8 cm)  ?BEHAVIORAL SYMPTOMS/MOOD NEUROLOGICAL BOWEL NUTRITION STATUS  ?    Continent Diet (See DC summary)  ?AMBULATORY STATUS COMMUNICATION OF NEEDS Skin   ?Limited Assist Verbally Other (Comment) (ecchymosis-arm,leg and face) ?  ?  ?  ?    ?     ?     ? ? ?Personal Care Assistance Level of Assistance  ?Bathing, Feeding, Dressing Bathing Assistance: Limited assistance ?Feeding assistance: Limited assistance ?Dressing Assistance: Limited assistance ?   ? ?Functional Limitations Info  ?Sight, Hearing Sight Info: Impaired ?Hearing Info:  Adequate ?   ? ? ?SPECIAL CARE FACTORS FREQUENCY  ?PT (By licensed PT), OT (By licensed OT)   ?  ?PT Frequency: 5x per week ?OT Frequency: 5x per week ?  ?  ?  ?   ? ? ?Contractures Contractures Info: Not present  ? ? ?Additional Factors Info  ?Code Status, Allergies Code Status Info: DNR ?Allergies Info: NKA ?  ?  ?  ?   ? ?Current Medications (08/13/2021):  This is the current hospital active medication list ?Current Facility-Administered Medications  ?Medication Dose Route Frequency Provider Last Rate Last Admin  ? 0.9 %  sodium chloride infusion  250 mL Intravenous PRN Orma Flaming, MD      ? acetaminophen (TYLENOL) tablet 650 mg  650 mg Oral Q6H PRN Orma Flaming, MD      ? Or  ? acetaminophen (TYLENOL) suppository 650 mg  650 mg Rectal Q6H PRN Orma Flaming, MD      ? cholecalciferol (VITAMIN D3) tablet 1,000 Units  1,000 Units Oral QHS Patrecia Pour, MD   1,000 Units at 08/12/21 2153  ? dorzolamide-timolol (COSOPT) 22.3-6.8 MG/ML ophthalmic solution 1 drop  1 drop Both Eyes BID Orma Flaming, MD   1 drop at 68/34/19 6222  ? folic acid (FOLVITE) tablet 1 mg  1 mg Oral Daily Orma Flaming, MD   1 mg at 08/13/21 0830  ? furosemide (LASIX) tablet 40 mg  40 mg Oral Daily Janina Mayo, MD   40 mg at 08/13/21 1010  ? heparin ADULT infusion  100 units/mL (25000 units/291m)  1,150 Units/hr Intravenous Continuous BLaren Everts RPH 11.5 mL/hr at 08/13/21 0740 1,150 Units/hr at 08/13/21 0740  ? latanoprost (XALATAN) 0.005 % ophthalmic solution 1 drop  1 drop Both Eyes QHS WOrma Flaming MD   1 drop at 08/12/21 2155  ? LORazepam (ATIVAN) tablet 1-4 mg  1-4 mg Oral Q1H PRN WOrma Flaming MD      ? Or  ? LORazepam (ATIVAN) injection 1-4 mg  1-4 mg Intravenous Q1H PRN WOrma Flaming MD      ? melatonin tablet 5 mg  5 mg Oral QHS PRN RShela Leff MD   5 mg at 08/12/21 2153  ? metoprolol tartrate (LOPRESSOR) tablet 25 mg  25 mg Oral Q6H MRudean Curt MD   25 mg at 08/13/21 0631  ? multivitamin  with minerals tablet 1 tablet  1 tablet Oral Daily WOrma Flaming MD   1 tablet at 08/13/21 0830  ? sodium chloride flush (NS) 0.9 % injection 3 mL  3 mL Intravenous Q12H WOrma Flaming MD   3 mL at 08/13/21 0827  ? sodium chloride flush (NS) 0.9 % injection 3 mL  3 mL Intravenous PRN WOrma Flaming MD      ? thiamine tablet 100 mg  100 mg Oral Daily WOrma Flaming MD   100 mg at 08/13/21 0830  ? Or  ? thiamine (B-1) injection 100 mg  100 mg Intravenous Daily WOrma Flaming MD      ? ? ? ?Discharge Medications: ?Please see discharge summary for a list of discharge medications. ? ?Relevant Imaging Results: ? ?Relevant Lab Results: ? ? ?Additional Information ?SSN# [id number] vaccinated ? ?LBary Castilla LCSW ? ? ? ? ?

## 2021-08-13 NOTE — Progress Notes (Addendum)
ANTICOAGULATION CONSULT NOTE - Follow Up Consult ? ?Pharmacy Consult for IV heparin ?Indication: atrial fibrillation ? ?No Known Allergies ? ?Patient Measurements: ?Height: '5\' 10"'$  (177.8 cm) ?Weight: 76.1 kg (167 lb 12.3 oz) ?IBW/kg (Calculated) : 73 ?Heparin Dosing Weight: 76 kg ? ?Vital Signs: ?Temp: 98.1 ?F (36.7 ?C) (03/18 0321) ?Temp Source: Oral (03/18 0321) ?BP: 99/59 (03/18 0321) ?Pulse Rate: 91 (03/18 0321) ? ?Labs: ?Recent Labs  ?  08/11/21 ?1521 08/11/21 ?1532 08/11/21 ?1800 08/11/21 ?2113 08/12/21 ?0107 08/12/21 ?1836 08/13/21 ?0128  ?HGB 11.0*  --   --   --  10.6*  --  9.5*  ?HCT 33.0*  --   --   --  30.5*  --  27.8*  ?PLT 309  --   --   --  298  --  258  ?LABPROT  --   --   --  15.8*  --   --   --   ?INR  --   --   --  1.3*  --   --   --   ?HEPARINUNFRC  --   --   --   --   --  0.34 0.27*  ?CREATININE 1.18  --   --   --  1.23  --  1.62*  ?CKTOTAL  --  132  --   --   --   --   --   ?TROPONINIHS  --  255* 262*  --  218*  --   --   ? ? ?Estimated Creatinine Clearance: 35.7 mL/min (A) (by C-G formula based on SCr of 1.62 mg/dL (H)). ? ? ?Assessment: ?86 year old male with new afib. Not on anticoagulation prior to admit. Discussion ongoing with family about transition to oral anticoagulation. Pharmacy consulted for dosing of IV heparin. Received lovenox 3/16pm. ? ?Heparin level returned subtherapeutic at 0.27 on 1000 units/hr. Per RN no issues with heparin infusion, no s/sx of bleeding. Patient currently has AKI Creatinine 1.18 >> 1.62. ? ?Goal of Therapy:  ?Heparin level 0.3-0.7 units/ml ?Monitor platelets by anticoagulation protocol: Yes ?  ?Plan:  ?Increase heparin infusion to 1150 units/hr ?Check anti-Xa level in 8 hours and daily while on heparin ?Continue to monitor H&H and platelets ? ?Carma Lair, PharmD Candidate [phone] ?08/13/2021,5:03 AM ? ? ?

## 2021-08-14 DIAGNOSIS — L8990 Pressure ulcer of unspecified site, unspecified stage: Secondary | ICD-10-CM | POA: Insufficient documentation

## 2021-08-14 LAB — BASIC METABOLIC PANEL
Anion gap: 11 (ref 5–15)
BUN: 40 mg/dL — ABNORMAL HIGH (ref 8–23)
CO2: 20 mmol/L — ABNORMAL LOW (ref 22–32)
Calcium: 8.1 mg/dL — ABNORMAL LOW (ref 8.9–10.3)
Chloride: 94 mmol/L — ABNORMAL LOW (ref 98–111)
Creatinine, Ser: 1.5 mg/dL — ABNORMAL HIGH (ref 0.61–1.24)
GFR, Estimated: 46 mL/min — ABNORMAL LOW (ref >60)
Glucose, Bld: 96 mg/dL (ref 70–99)
Potassium: 4.3 mmol/L (ref 3.5–5.1)
Sodium: 125 mmol/L — ABNORMAL LOW (ref 135–145)

## 2021-08-14 LAB — CBC
HCT: 28.8 % — ABNORMAL LOW (ref 39.0–52.0)
Hemoglobin: 9.5 g/dL — ABNORMAL LOW (ref 13.0–17.0)
MCH: 32.4 pg (ref 26.0–34.0)
MCHC: 33 g/dL (ref 30.0–36.0)
MCV: 98.3 fL (ref 80.0–100.0)
Platelets: 276 10*3/uL (ref 150–400)
RBC: 2.93 MIL/uL — ABNORMAL LOW (ref 4.22–5.81)
RDW: 14.7 % (ref 11.5–15.5)
WBC: 9.9 10*3/uL (ref 4.0–10.5)
nRBC: 0 % (ref 0.0–0.2)

## 2021-08-14 LAB — BILIRUBIN, FRACTIONATED(TOT/DIR/INDIR)
Bilirubin, Direct: 0.5 mg/dL — ABNORMAL HIGH (ref 0.0–0.2)
Indirect Bilirubin: 1.2 mg/dL — ABNORMAL HIGH (ref 0.3–0.9)
Total Bilirubin: 1.7 mg/dL — ABNORMAL HIGH (ref 0.3–1.2)

## 2021-08-14 LAB — MAGNESIUM: Magnesium: 2.3 mg/dL (ref 1.7–2.4)

## 2021-08-14 LAB — HEPARIN LEVEL (UNFRACTIONATED): Heparin Unfractionated: 0.23 IU/mL — ABNORMAL LOW (ref 0.30–0.70)

## 2021-08-14 LAB — OSMOLALITY: Osmolality: 277 mOsm/kg (ref 275–295)

## 2021-08-14 MED ORDER — ENOXAPARIN SODIUM 40 MG/0.4ML IJ SOSY
40.0000 mg | PREFILLED_SYRINGE | INTRAMUSCULAR | Status: DC
  Administered 2021-08-14 – 2021-08-15 (×2): 40 mg via SUBCUTANEOUS
  Filled 2021-08-14 (×2): qty 0.4

## 2021-08-14 MED ORDER — ASCORBIC ACID 500 MG PO TABS
500.0000 mg | ORAL_TABLET | Freq: Every day | ORAL | Status: DC
  Administered 2021-08-14 – 2021-08-16 (×3): 500 mg via ORAL
  Filled 2021-08-14 (×3): qty 1

## 2021-08-14 MED ORDER — ASPIRIN EC 81 MG PO TBEC
81.0000 mg | DELAYED_RELEASE_TABLET | Freq: Every day | ORAL | Status: DC
  Administered 2021-08-14 – 2021-08-16 (×3): 81 mg via ORAL
  Filled 2021-08-14 (×3): qty 1

## 2021-08-14 MED ORDER — METOPROLOL SUCCINATE ER 50 MG PO TB24
75.0000 mg | ORAL_TABLET | Freq: Every day | ORAL | Status: DC
  Administered 2021-08-14 – 2021-08-16 (×3): 75 mg via ORAL
  Filled 2021-08-14 (×3): qty 1

## 2021-08-14 NOTE — Progress Notes (Addendum)
ANTICOAGULATION CONSULT NOTE - Follow Up Consult ? ?Pharmacy Consult for heparin ?Indication: atrial fibrillation ? ?No Known Allergies ? ?Patient Measurements: ?Height: '5\' 10"'$  (177.8 cm) ?Weight: 76.1 kg (167 lb 12.3 oz) ?IBW/kg (Calculated) : 73 ?Heparin Dosing Weight: 76 kg ? ?Vital Signs: ?Temp: 97.3 ?F (36.3 ?C) (03/19 0002) ?Temp Source: Oral (03/19 0002) ?BP: 116/59 (03/19 0002) ?Pulse Rate: 81 (03/19 0002) ? ?Labs: ?Recent Labs  ?  08/11/21 ?1532 08/11/21 ?1800 08/11/21 ?2113 08/12/21 ?0107 08/12/21 ?1836 08/13/21 ?0128 08/13/21 ?1305 08/13/21 ?2124 08/14/21 ?0008 08/14/21 ?0045  ?HGB  --   --   --  10.6*  --  9.5*  --   --  9.5*  --   ?HCT  --   --   --  30.5*  --  27.8*  --   --  28.8*  --   ?PLT  --   --   --  298  --  258  --   --  276  --   ?LABPROT  --   --  15.8*  --   --   --   --   --   --   --   ?INR  --   --  1.3*  --   --   --   --   --   --   --   ?HEPARINUNFRC  --   --   --   --    < > 0.27* 0.20* 0.35  --  0.23*  ?CREATININE  --   --   --  1.23  --  1.62*  --   --  1.50*  --   ?CKTOTAL 132  --   --   --   --   --   --   --   --   --   ?TROPONINIHS 255* 262*  --  218*  --   --   --   --   --   --   ? < > = values in this interval not displayed.  ? ? ?Estimated Creatinine Clearance: 38.5 mL/min (A) (by C-G formula based on SCr of 1.5 mg/dL (H)). ? ?Assessment: ?86 year old male with new afib. Not on anticoagulation prior to admit. Discussion ongoing with family about transition to oral anticoagulation. Pharmacy consulted for dosing of IV heparin. Received lovenox 3/16pm. ? ?Patient has had several falls recently, has bruising and 'racoon eyes' but RN reports it has not gotten worse since admit.  ? ?Heparin level returned subtherapeutic at 0.23 on 1350 units/hr.  ? ?Goal of Therapy:  ?Heparin level 0.3-0.7 units/ml ?Monitor platelets by anticoagulation protocol: Yes ?  ?Plan:  ?Increase heparin infusion to 1500 units/hr, no bolus due to bruising, level is 0.23 (close to therapeutic range). ?Check  anti-Xa level in 8 hours ?Continue to monitor H&H and platelets ? ?Carma Lair, PharmD Candidate [phone] ?08/14/2021,2:01 AM ? ? ?

## 2021-08-14 NOTE — Plan of Care (Signed)
  Problem: Activity: Goal: Ability to tolerate increased activity will improve Outcome: Progressing   Problem: Cardiac: Goal: Ability to achieve and maintain adequate cardiopulmonary perfusion will improve Outcome: Progressing   

## 2021-08-14 NOTE — Progress Notes (Signed)
Chaplain responded to [facility], "pt wants to die".  Pt was sitting up in bed, and despite a very black and blue face, appeared to be in a very good mood.  He was chatty, friendly.  Son was bedside.  Chaplain asked about needs, pt said nothing, then suggested a prayer, saying that he would pray for the chaplain.  Chaplain and pt both prayed. Pt is very religious, Baptist. Pt and son appreciated visit but denied further needs at this time. ? ?Please contact as needed for ongoing support.  ? ?Minus Liberty, Chaplain ?Pager:  [phone] ? ? ? 08/14/21 1430  ?Clinical Encounter Type  ?Visited With Patient and family together  ?Visit Type Initial;Spiritual support  ?Referral From Nurse  ?Consult/Referral To Chaplain  ?Spiritual Encounters  ?Spiritual Needs Prayer  ?Stress Factors  ?Patient Stress Factors Health changes  ?Family Stress Factors Not reviewed  ? ? ?

## 2021-08-14 NOTE — Progress Notes (Signed)
? ?Progress Note ? ?Patient Name: Guy Hutchinson ?Date of Encounter: 08/14/2021 ? ?[facility] Cardiologist: None  ? ?Subjective  ? ?Guy Hutchinson feels well this AM, looks like plan is for SNF. Talking to him and his son about further w/u for valve replacement, decided to not pursue. Otherwise he is doing well ? ? ?Inpatient Medications  ?  ?Scheduled Meds: ? cholecalciferol  1,000 Units Oral QHS  ? dorzolamide-timolol  1 drop Both Eyes BID  ? folic acid  1 mg Oral Daily  ? furosemide  40 mg Oral Daily  ? latanoprost  1 drop Both Eyes QHS  ? metoprolol tartrate  25 mg Oral Q6H  ? multivitamin with minerals  1 tablet Oral Daily  ? sodium chloride flush  3 mL Intravenous Q12H  ? thiamine  100 mg Oral Daily  ? Or  ? thiamine  100 mg Intravenous Daily  ? ?Continuous Infusions: ? sodium chloride    ? heparin 1,500 Units/hr (08/14/21 0342)  ? ?PRN Meds: ?sodium chloride, acetaminophen **OR** acetaminophen, melatonin, sodium chloride flush  ? ?Vital Signs  ?  ?Vitals:  ? 08/14/21 0400 08/14/21 0629 08/14/21 0742 08/14/21 0743  ?BP: 116/74 111/63 119/74   ?Pulse: 78 73 84   ?Resp: 18  18   ?Temp: (!) 97.3 ?F (36.3 ?C)     ?TempSrc: Oral   Oral  ?SpO2: 97%  99%   ?Weight:      ?Height:      ? ? ?Intake/Output Summary (Last 24 hours) at 08/14/2021 0843 ?Last data filed at 08/14/2021 0800 ?Gross per 24 hour  ?Intake 290 ml  ?Output 900 ml  ?Net -610 ml  ? ?Last 3 Weights 08/13/2021 08/12/2021 08/11/2021  ?Weight (lbs) 167 lb 12.3 oz 167 lb 5.3 oz 168 lb  ?Weight (kg) 76.1 kg 75.9 kg 76.204 kg  ?   ? ?Telemetry  ?  ?Atrial fibrillation rates predominantly < 110 bpm - Personally Reviewed ? ?ECG  ?  ?No new - Personally Reviewed ? ?Physical Exam  ? ?Vitals:  ? 08/14/21 0629 08/14/21 0742  ?BP: 111/63 119/74  ?Pulse: 73 84  ?Resp:  18  ?Temp:    ?SpO2:  99%  ? ? ?GEN: No acute distress.   ?Neck: mild JVD ?Cardiac: RRR, RUSB SEM, no rubs or gallops.  ?Respiratory: nl wob, expiratory wheezes ?GI: Soft, nontender, non-distended  ?MS:  2+ LE edema ?Neuro:  Nonfocal  ?Psych: Normal affect  ? ?Labs  ?  ?High Sensitivity Troponin:   ?Recent Labs  ?Lab 08/11/21 ?1532 08/11/21 ?1800 08/12/21 ?0107  ?TROPONINIHS 255* 262* 218*  ?   ?Chemistry ?Recent Labs  ?Lab 08/11/21 ?1521 08/12/21 ?0107 08/13/21 ?0128 08/14/21 ?0008  ?NA 128* 129* 129* 125*  ?K 3.9 3.2* 3.7 4.3  ?CL 98 98 98 94*  ?CO2 21* 22 19* 20*  ?GLUCOSE 130* 126* 103* 96  ?BUN 21 21 34* 40*  ?CREATININE 1.18 1.23 1.62* 1.50*  ?CALCIUM 8.4* 8.3* 8.0* 8.1*  ?MG 2.5*  --  2.1 2.3  ?PROT 6.5  --  5.6*  --   ?ALBUMIN 3.5  --  2.8*  --   ?AST 35  --  22  --   ?ALT 28  --  21  --   ?ALKPHOS 96  --  79  --   ?BILITOT 2.2*  --  2.3* 1.7*  ?GFRNONAA >60 58* 42* 46*  ?ANIONGAP '9 9 12 11  '$ ?  ?Lipids  ?Recent Labs  ?Lab 08/12/21 ?  0107  ?CHOL 176  ?TRIG 88  ?HDL 26*  ?LDLCALC 132*  ?CHOLHDL 6.8  ?  ?Hematology ?Recent Labs  ?Lab 08/12/21 ?0107 08/13/21 ?0128 08/14/21 ?0008  ?WBC 12.4* 13.4* 9.9  ?RBC 3.22* 2.94* 2.93*  ?HGB 10.6* 9.5* 9.5*  ?HCT 30.5* 27.8* 28.8*  ?MCV 94.7 94.6 98.3  ?MCH 32.9 32.3 32.4  ?MCHC 34.8 34.2 33.0  ?RDW 14.6 14.9 14.7  ?PLT 298 258 276  ? ?Thyroid  ?Recent Labs  ?Lab 08/11/21 ?1837  ?TSH 1.022  ?FREET4 1.54*  ?  ?BNP ?Recent Labs  ?Lab 08/11/21 ?1521  ?BNP 1,049.1*  ?  ?DDimer  ?Recent Labs  ?Lab 08/11/21 ?1532  ?DDIMER 3.91*  ?  ? ?Radiology  ?  ?ECHOCARDIOGRAM COMPLETE ? ?Result Date: 08/12/2021 ?   ECHOCARDIOGRAM REPORT   Patient Name:   Guy Hutchinson Date of Exam: 08/12/2021 Medical Rec #:  [id number]        Height:       70.0 in Accession #:    [id number]       Weight:       167.3 lb Date of Birth:  [birth year]        BSA:          1.935 m? Patient Age:    86 years         BP:           101/55 mmHg Patient Gender: M                HR:           91 bpm. Exam Location:  Inpatient Procedure: 2D Echo, Cardiac Doppler, Color Doppler and Intracardiac            Opacification Agent Indications:    I48.91* Unspeicified atrial fibrillation  History:        Patient has no prior history  of Echocardiogram examinations.                 Abnormal ECG, Arrythmias:Atrial Fibrillation,                 Signs/Symptoms:Dizziness/Lightheadedness; Risk                 Factors:Dyslipidemia. ETOH.  Sonographer:    Guy Hutchinson RDCS Referring Phys: [id number] [facility]  Sonographer Comments: Technically difficult study due to poor echo windows. Patient talking throughout pedoff. IMPRESSIONS  1. Left ventricular ejection fraction, by estimation, is 60 to 65%. The left ventricle has normal function. The left ventricle demonstrates regional wall motion abnormalities with basal inferior hypokinesis. There is moderate left ventricular hypertrophy. Left ventricular diastolic parameters are indeterminate.  2. Right ventricular systolic function is normal. The right ventricular size is normal. Tricuspid regurgitation signal is inadequate for assessing PA pressure.  3. Left atrial size was mildly dilated.  4. The mitral valve is normal in structure. Trivial mitral valve regurgitation. No evidence of mitral stenosis.  5. The aortic valve is tricuspid. There is severe calcifcation of the aortic valve. Aortic valve regurgitation is moderate. Severe aortic valve stenosis. Aortic valve mean gradient measures 58.0 mmHg.  6. The inferior vena cava is dilated in size with >50% respiratory variability, suggesting right atrial pressure of 8 mmHg.  7. The patient was in atrial fibrillation. FINDINGS  Left Ventricle: Left ventricular ejection fraction, by estimation, is 60 to 65%. The left ventricle has normal function. The left ventricle demonstrates regional wall motion abnormalities. The left ventricular internal cavity size was normal in  size. There is moderate left ventricular hypertrophy. Left ventricular diastolic parameters are indeterminate. Right Ventricle: The right ventricular size is normal. No increase in right ventricular wall thickness. Right ventricular systolic function is normal. Tricuspid regurgitation signal is  inadequate for assessing PA pressure. Left Atrium: Left atrial size was mildly dilated. Right Atrium: Right atrial size was normal in size. Pericardium: There is no evidence of pericardial effusion. Mitral Valve: The mitral valve is normal in structure. There is moderate calcification of the mitral valve leaflet(s). Mild mitral annular calcification. Trivial mitral valve regurgitation. No evidence of mitral valve stenosis. Tricuspid Valve: The tricuspid valve is normal in structure. Tricuspid valve regurgitation is not demonstrated. Aortic Valve: The aortic valve is tricuspid. There is severe calcifcation of the aortic valve. Aortic valve regurgitation is moderate. Aortic regurgitation PHT measures 441 msec. Severe aortic stenosis is present. Aortic valve mean gradient measures 58.0  mmHg. Aortic valve peak gradient measures 57.0 mmHg. Aortic valve area, by VTI measures 1.51 cm?. Pulmonic Valve: The pulmonic valve was normal in structure. Pulmonic valve regurgitation is mild. Aorta: The aortic root is normal in size and structure. Venous: The inferior vena cava is dilated in size with greater than 50% respiratory variability, suggesting right atrial pressure of 8 mmHg. IAS/Shunts: No atrial level shunt detected by color flow Doppler.  LEFT VENTRICLE PLAX 2D LVIDd:         3.90 cm LVIDs:         3.50 cm LV PW:         1.60 cm LV IVS:        1.70 cm LVOT diam:     2.50 cm LV SV:         143 LV SV Index:   74 LVOT Area:     4.91 cm?  LV Volumes (MOD) LV vol d, MOD A2C: 131.0 ml LV vol d, MOD A4C: 142.0 ml LV vol s, MOD A2C: 62.2 ml LV vol s, MOD A4C: 94.8 ml LV SV MOD A2C:     68.8 ml LV SV MOD A4C:     142.0 ml LV SV MOD BP:      57.9 ml RIGHT VENTRICLE            IVC RV S prime:     8.68 cm/s  IVC diam: 2.40 cm TAPSE (M-mode): 1.4 cm LEFT ATRIUM           Index        RIGHT ATRIUM           Index LA diam:      3.70 cm 1.91 cm/m?   RA Area:     14.00 cm? LA Vol (A2C): 23.9 ml 12.35 ml/m?  RA Volume:   25.50 ml  13.18  ml/m? LA Vol (A4C): 45.8 ml 23.67 ml/m?  AORTIC VALVE                     PULMONIC VALVE AV Area (Vmax):    2.35 cm?      PR End Diast Vel: 2.21 msec AV Area (Vmean):   2.30 cm? AV Area (VTI):     1.51 c

## 2021-08-14 NOTE — Progress Notes (Signed)
?Progress Note ? ?Patient: Guy Hutchinson XLK:[id number] DOB: 07/01/1935  ?DOA: 08/11/2021  DOS: 08/14/2021  ?  ?Brief hospital course: ?Guy Hutchinson is an 86 y.o. male with a history of HLD, prostate CA, recurrent falls, daily alcohol use who presented to the ED 3/16 with leg swelling, dyspnea worse with exertion, and worsening weakness and falls. He appeared peripherally volume overloaded consistent with CHF, and with new atrial fibrillation. IV diuresis, metoprolol, IV heparin were given, and cardiology consult pursued. Echo reveals aortic valve disease for which patient and family decline TAVR consideration. SNF placement is pursued. ? ?Assessment and Plan: ?* Atrial fibrillation ([place]) ?New diagnosis.  ?- Continue metoprolol, converted to succinate '75mg'$  daily.  ?- Continue cardiac monitoring.  ?- Risk of fall with severe bleeding on anticoagulation appears to exceed the relative risk reduction in stroke for this patient long term. Both medical and cardiology teams have discussed with patient/family and we will not continue this.   ?- Maintain K and Mg levels ? ?Elevated troponin ?Troponin 255>265 without chest pain or ischemic ST changes on ECG. Pt has extensive coronary calcifications on CT, but presentation is not consistent with ACS at this time.  ?- Cardiology not currently planning intervention/work up as inpatient. ?- Continue BB, statin, aspirin ? ?Hyponatremia ?Likely hypervolemia hyponatremia in setting of new afib with volume overload. Remains low.  ?- Check urine studies, serum osm. ?- Continue lasix as above ?- Implement fluid restriction ? ?Bilateral lower extremity edema ?BNP >1000, echo pending. Edema is likely multifactorial inclusive of venous stasis based on hyperpigmentation (in addition to bruising). No DVT on U/S.  ?- WOC consulted for weeping wounds. ? ?Baker's cyst of knee, right ?Follow up outpatient  ? ?Pure hypercholesterolemia ?LDL 132, HDL 23.  ?- Given extensive coronary  calcifications, would qualify for statin. LFTs wnl. ? ?Aortic valve disease ?Echo reveals moderate AR, severe AS. Suspect this is symptomatic/cause of lightheadedness and falls. Pt/family decline consideration for TAVR. ? ?AKI (acute kidney injury) ([place]) ?Due to diuresis which we've deescalated with subsequent stabilization.  ?- Monitor in AM, avoiding nephrotoxins.  ? ?Hyperbilirubinemia ?In the absence of evidence of hemolysis, unconjugated hyperbili in this setting is most consistent with Gilbert's. Also possible related to widespread ecchymoses. ? ?Hypokalemia ?- Supplemented ?- Monitor  ? ?Acute heart failure with preserved ejection fraction (HFpEF) ([place]) ?LVEF 60-65%, mod LVH with basal-inferior hypokinesis, indeterminate diastolic function (echo during atrial fibrillation), dilated IVC.  ?- Continue oral diuretic and monitoring UOP, weights.  ?- Cardiac diet ?- Note wt up at 76kg from priors in 2021 in mid 60's, though no orthopnea/pulmonary edema currently evident. ? ? ?Alcohol use ?Daily use, unclear quantity. No evidence of withdrawal during hospitalization, stopped CIWA. ? ?Subjective: No bleeding. Wants to stop getting blood draws so often, doesn't want to be on a blood thinner, wants to restart his baby aspirin. No chest pain, palpitations, or dyspnea.  ? ?Objective: ?Vitals:  ? 08/14/21 0629 08/14/21 0742 08/14/21 0743 08/14/21 1153  ?BP: 111/63 119/74  120/69  ?Pulse: 73 84  85  ?Resp:  18  20  ?Temp:      ?TempSrc:   Oral   ?SpO2:  99%  98%  ?Weight:      ?Height:      ?Gen: 86 y.o. male in no distress ?Pulm: Nonlabored breathing room air. Clear. ?CV: Irreg with rate in 70's, stable S/D murmur, no rub, or gallop. No JVD, improved dependent edema. ?GI: Abdomen soft, non-tender, non-distended, with normoactive bowel  sounds.  ?Ext: Warm, no deformities ?Skin: No new rashes, lesions or ulcers on visualized skin. Ecchymoses are healing as expected. Legs are wrapped without exudate, odor or  bleeding. ?Neuro: Alert and oriented. No focal neurological deficits. ?Psych: Judgement and insight appear fair. Mood euthymic & affect congruent. Behavior is appropriate.   ? ?Data Personally reviewed: ?Hgb: 11, sodium: 128, bnp: 1049, troponin 255>262 ?D-dimer: 3.91 ?TSH 1.022 ?LDL 132, HDL 26 ?CT head/face: no acute finding ?Ct cervical spine: no acute finding ?CTA chest: no PE, diffuse coronary artery disease ?CXR: no edema. ? Nodular density in right lung apex.  ?Doppler: no DVT ?ECHO 08/12/2021 ?1. Left ventricular ejection fraction, by estimation, is 60 to 65%. The left ventricle has normal function. The left ventricle demonstrates regional wall motion abnormalities with basal inferior hypokinesis. There is moderate left ventricular hypertrophy. Left ventricular diastolic parameters are indeterminate.   ?2. Right ventricular systolic function is normal. The right ventricular size is normal. Tricuspid regurgitation signal is inadequate for assessing PA pressure.   ?3. Left atrial size was mildly dilated.   ?4. The mitral valve is normal in structure. Trivial mitral valve regurgitation. No evidence of mitral stenosis.   ?5. The aortic valve is tricuspid. There is severe calcifcation of the aortic valve. Aortic valve regurgitation is moderate. Severe aortic valve stenosis. Aortic valve mean gradient measures 58.0 mmHg.   ?6. The inferior vena cava is dilated in size with >50% respiratory variability, suggesting right atrial pressure of 8 mmHg.   ?7. The patient was in atrial fibrillation. ? ?Family Communication: None at bedside. Called and spoke with son by phone 3/18 who spoke with cardiology today. ? ?Disposition: ?Status is: Inpatient ?Remains inpatient appropriate because: Will pursue SNF. ?Planned Discharge Destination: Skilled nursing facility ? ?Patrecia Pour, MD ?08/14/2021 11:56 AM ?Page by [email or web address]  ?

## 2021-08-15 DIAGNOSIS — I359 Nonrheumatic aortic valve disorder, unspecified: Secondary | ICD-10-CM

## 2021-08-15 DIAGNOSIS — N179 Acute kidney failure, unspecified: Secondary | ICD-10-CM

## 2021-08-15 DIAGNOSIS — E876 Hypokalemia: Secondary | ICD-10-CM

## 2021-08-15 DIAGNOSIS — I5031 Acute diastolic (congestive) heart failure: Secondary | ICD-10-CM

## 2021-08-15 DIAGNOSIS — I480 Paroxysmal atrial fibrillation: Secondary | ICD-10-CM

## 2021-08-15 LAB — BASIC METABOLIC PANEL
Anion gap: 9 (ref 5–15)
BUN: 35 mg/dL — ABNORMAL HIGH (ref 8–23)
CO2: 22 mmol/L (ref 22–32)
Calcium: 8.2 mg/dL — ABNORMAL LOW (ref 8.9–10.3)
Chloride: 96 mmol/L — ABNORMAL LOW (ref 98–111)
Creatinine, Ser: 1.31 mg/dL — ABNORMAL HIGH (ref 0.61–1.24)
GFR, Estimated: 54 mL/min — ABNORMAL LOW (ref >60)
Glucose, Bld: 100 mg/dL — ABNORMAL HIGH (ref 70–99)
Potassium: 3.6 mmol/L (ref 3.5–5.1)
Sodium: 127 mmol/L — ABNORMAL LOW (ref 135–145)

## 2021-08-15 LAB — MAGNESIUM: Magnesium: 2.3 mg/dL (ref 1.7–2.4)

## 2021-08-15 LAB — OSMOLALITY: Osmolality: 273 mOsm/kg — ABNORMAL LOW (ref 275–295)

## 2021-08-15 MED ORDER — FUROSEMIDE 40 MG PO TABS
40.0000 mg | ORAL_TABLET | Freq: Every day | ORAL | Status: DC
Start: 2021-08-16 — End: 2021-10-26

## 2021-08-15 MED ORDER — METOPROLOL SUCCINATE ER 25 MG PO TB24: 75.0000 mg | ORAL_TABLET | Freq: Every day | ORAL | Status: DC

## 2021-08-15 MED ORDER — ASPIRIN 81 MG PO TBEC
81.0000 mg | DELAYED_RELEASE_TABLET | Freq: Every day | ORAL | Status: AC
Start: 2021-08-16 — End: ?

## 2021-08-15 NOTE — Plan of Care (Signed)
?  Problem: Education: ?Goal: Knowledge of disease or condition will improve ?Outcome: Progressing ?Goal: Understanding of medication regimen will improve ?Outcome: Progressing ?  ?Problem: Activity: ?Goal: Risk for activity intolerance will decrease ?Outcome: Progressing ?  ?Problem: Skin Integrity: ?Goal: Risk for impaired skin integrity will decrease ?Outcome: Progressing ?  ?

## 2021-08-15 NOTE — Discharge Summary (Signed)
?Physician Discharge Summary ?  ?Patient: Guy Hutchinson MRN: [id number] DOB: [birth year]  ?Admit date:     08/11/2021  ?Discharge date: 08/15/2021   ?Discharge Physician: Patrecia Pour  ? ?PCP: Guy Agreste, MD  ? ?Recommendations at discharge:  ?Follow up with cardiology as scheduled. Could consider TAVR evaluation in the future, not currently desired by pt. ?Continue routine PCP follow up. ?Recheck BMP in the next week with attention to renal function and hyponatremia. ?Continue wound care for weeping LE edema. ? ?Discharge Diagnoses: ?Principal Problem: ?  Atrial fibrillation ([place]) ?Active Problems: ?  Elevated troponin ?  Hyponatremia ?  Bilateral lower extremity edema ?  Baker's cyst of knee, right ?  Pure hypercholesterolemia ?  Alcohol use ?  Hypervolemia ?  Acute heart failure with preserved ejection fraction (HFpEF) ([place]) ?  Hypokalemia ?  Hyperbilirubinemia ?  AKI (acute kidney injury) ([place]) ?  Aortic valve disease ?  Pressure injury of skin ? ?Resolved Problems: ?  History of prostate cancer ? ?Hospital Course: ?IGNACIO LOWDER is an 86 y.o. male with a history of HLD, prostate CA, recurrent falls, daily alcohol use who presented to the ED 3/16 with leg swelling, dyspnea worse with exertion, and worsening weakness and falls. He appeared peripherally volume overloaded consistent with CHF, and with new atrial fibrillation. IV diuresis, metoprolol, IV heparin were given, and cardiology consult pursued. Echo reveals aortic valve disease for which patient and family decline TAVR consideration at this time. The patient's severe history of falls with severe ecchymoses and untreated aortic valve disorder are considered to be relative contraindications to ongoing anticoagulation and the patient and family confirm their desire to avoid blood thinners. The patient will require rehabilitation at SNF. ? ?Assessment and Plan: ?* Atrial fibrillation ([place]) ?New diagnosis. Remains in rate-controlled AFib.  ?- Continue  metoprolol, converted to succinate '75mg'$  daily.  ?- Risk of fall with severe bleeding on anticoagulation appears to exceed the relative risk reduction in stroke for this patient long term. Both medical and cardiology teams have discussed with patient/family and we will not continue this.   ?- Maintain K and Mg levels ? ?Elevated troponin ?Troponin 255>265 without chest pain or ischemic ST changes on ECG. Pt has extensive coronary calcifications on CT, but presentation is not consistent with ACS at this time.  ?- Cardiology not currently planning intervention/work up as inpatient. ?- Continue BB, statin, aspirin  ? ?Hyponatremia ?Likely hypervolemia hyponatremia in setting of new afib with volume overload. Remains low but asymptomatic. May be chronic as well, was low in 2018 and 2021 (available data). ?- Continue lasix as above ?- Implement fluid restriction ?- Recheck within the next week. If persistent would need further work up. ? ?Bilateral lower extremity edema ?BNP >1000, echo pending. Edema is likely multifactorial inclusive of venous stasis based on hyperpigmentation (in addition to bruising). No DVT on U/S.  ?- WOC consulted for weeping wounds. ? ?Baker's cyst of knee, right ?- Follow up prn ? ?Pure hypercholesterolemia ?LDL 132, HDL 23.  ?- Given extensive coronary calcifications, would qualify for statin. LFTs wnl. ? ?Aortic valve disease ?Echo reveals moderate AR, severe AS. Suspect this is symptomatic/cause of lightheadedness and falls. Pt/family decline consideration for TAVR. ? ?AKI (acute kidney injury) ([place]) ?Due to diuresis which we've deescalated with subsequent stabilization.  ?- Continues to improve, recheck at follow up ? ?Hyperbilirubinemia ?In the absence of evidence of hemolysis, unconjugated hyperbili in this setting is most consistent with Gilbert's. Also possibly  related to widespread ecchymoses. ? ?Hypokalemia ?- Supplemented ?- Monitor  ? ?Acute heart failure with preserved ejection  fraction (HFpEF) ([place]) ?LVEF 60-65%, mod LVH with basal-inferior hypokinesis, indeterminate diastolic function (echo during atrial fibrillation), dilated IVC.  ?- Continue oral diuretic and monitoring UOP, weights.  ?- Cardiac diet ?- Note wt up at 76kg from priors in 2021 in mid 60's, though no orthopnea/pulmonary edema currently evident. ? ?Leukocytosis ?Resolved without antimicrobial therapy, suspected to be reactive in setting of no fever or nidus of infection identified. ? ?Alcohol use ?Daily use, unclear quantity. No evidence of withdrawal during hospitalization, stopped CIWA. ? ?Consultants: Cardiology ?Procedures performed: None  ?Disposition: Skilled nursing facility ?Diet recommendation:  ?Cardiac diet ?DISCHARGE MEDICATION: ?Allergies as of 08/15/2021   ?No Known Allergies ?  ? ?  ?Medication List  ?  ? ?STOP taking these medications   ? ?ALPRAZolam 0.5 MG tablet ?Commonly known as: XANAX ?  ? ?  ? ?TAKE these medications   ? ?ARNICA EX ?Apply 1 application. topically in the morning and at bedtime. ?  ?aspirin 81 MG EC tablet ?Take 1 tablet (81 mg total) by mouth daily. Swallow whole. ?Start taking on: August 16, 2021 ?  ?dorzolamide-timolol 22.3-6.8 MG/ML ophthalmic solution ?Commonly known as: COSOPT ?Place 1 drop into both eyes 2 (two) times daily. ?  ?furosemide 40 MG tablet ?Commonly known as: LASIX ?Take 1 tablet (40 mg total) by mouth daily. ?Start taking on: August 16, 2021 ?  ?GARLIC PO ?Take 1 capsule by mouth daily. ?  ?Glucosamine Chond Double Str Caps ?Take 1 capsule by mouth daily. ?  ?latanoprost 0.005 % ophthalmic solution ?Commonly known as: XALATAN ?Place 1 drop into both eyes at bedtime. ?  ?metoprolol succinate 25 MG 24 hr tablet ?Commonly known as: TOPROL-XL ?Take 3 tablets (75 mg total) by mouth daily. ?Start taking on: August 16, 2021 ?  ?MILK THISTLE PO ?Take 1 capsule by mouth every morning. ?  ?multivitamin with minerals Tabs tablet ?Take 1 tablet by mouth every other day. ?  ?OLIVE  LEAF PO ?Take 400 mg by mouth daily. ?  ?OVER THE COUNTER MEDICATION ?Take 1 capsule by mouth every morning. [place] ?  ?Turmeric 400 MG Caps ?Take 1 capsule by mouth daily. ?  ?UNABLE TO FIND ?Take 1 capsule by mouth daily. Med Name: paudarco ?  ?UNABLE TO FIND ?Take 1 capsule by mouth daily. Med Name: elderberry ?  ?Vitamin C 500 MG Caps ?Take 1 capsule by mouth daily. ?  ? ?  ? ? Follow-up Information   ? ? Janina Mayo, MD Follow up.   ?Specialty: Cardiology ?Why: [facility] location - cardiology follow-up appointment is scheduled for Friday Sep 09, 2021 at 8:20 AM (Arrive by 8:05 AM). ?Contact information: ?[address] ?[place] [address] ?[phone] ? ? ?  ?  ? ? Guy Agreste, MD Follow up.   ?Specialties: Family Medicine, Sports Medicine ?Contact information: ?[address] ?[place] [address] ?[phone] ? ? ?  ?  ? ?  ?  ? ?  ? ?Subjective: No chest pain or dyspnea. He has no bleeding, says his bruising continues to improve but would improve faster if he had topical herbal remedy available. Wants melatonin this morning because he didn't sleep well last night. Leg swelling improved.  ? ?Discharge Exam: ?BP 112/71 (BP Location: Right Arm)   Pulse 81   Temp 97.7 ?F (36.5 ?C) (Oral)   Resp 18  Ht '5\' 10"'$  (1.778 m)   Wt 76.1 kg   SpO2 96%   BMI 24.07 kg/m?   ?Pleasant, elderly male in no distress ?Irreg irreg with high pitched holosystolic murmur, no rub, stable-improved LE edema ?Legs wrapped c/d/I without exudate or hemorrhage ?Clear, nonlabored ?Soft, NT, ND ?Alert, oriented ? ?Condition at discharge: stable ? ?The results of significant diagnostics from this hospitalization (including imaging, microbiology, ancillary and laboratory) are listed below for reference.  ? ?Imaging Studies: ?DG Chest 2 View ? ?Result Date: 08/11/2021 ?CLINICAL DATA:  Shortness of breath. EXAM: CHEST - 2 VIEW COMPARISON:  Chest x-ray 07/31/2021. FINDINGS: There is a vague  focal nodular density in the right upper lobe measuring 2 cm overlying the anterior right second rib lungs are otherwise clear. Cardiomediastinal silhouette is stable and within normal limits. No pleural effusion o

## 2021-08-15 NOTE — Care Management Important Message (Signed)
Important Message ? ?Patient Details  ?Name: Guy Hutchinson ?MRN: [id number] ?Date of Birth: 01/16/1936 ? ? ?Medicare Important Message Given:  Yes ? ? ? ? ?Guy Hutchinson ?08/15/2021, 3:26 PM ?

## 2021-08-15 NOTE — Progress Notes (Signed)
? ?Progress Note ? ?Patient Name: Guy Hutchinson ?Date of Encounter: 08/15/2021 ? ?[facility] Cardiologist: None  ? ?Subjective  ? ?Laying comfortably in bed.  [place].  No significant shortness of breath, no chest pain. ? ?Inpatient Medications  ?  ?Scheduled Meds: ? vitamin C  500 mg Oral Daily  ? aspirin EC  81 mg Oral Daily  ? cholecalciferol  1,000 Units Oral QHS  ? dorzolamide-timolol  1 drop Both Eyes BID  ? enoxaparin (LOVENOX) injection  40 mg Subcutaneous F64P  ? folic acid  1 mg Oral Daily  ? furosemide  40 mg Oral Daily  ? latanoprost  1 drop Both Eyes QHS  ? metoprolol succinate  75 mg Oral Daily  ? multivitamin with minerals  1 tablet Oral Daily  ? sodium chloride flush  3 mL Intravenous Q12H  ? thiamine  100 mg Oral Daily  ? Or  ? thiamine  100 mg Intravenous Daily  ? ?Continuous Infusions: ? sodium chloride    ? ?PRN Meds: ?sodium chloride, acetaminophen **OR** acetaminophen, melatonin, sodium chloride flush  ? ?Vital Signs  ?  ?Vitals:  ? 08/14/21 2205 08/15/21 0010 08/15/21 0402 08/15/21 3295  ?BP:  118/64 124/62 122/67  ?Pulse: 98 85 87 96  ?Resp: '19 15 19 18  '$ ?Temp:  97.7 ?F (36.5 ?C) 97.6 ?F (36.4 ?C) 97.7 ?F (36.5 ?C)  ?TempSrc:  Oral Oral Oral  ?SpO2: 97% 97% 94% 97%  ?Weight:      ?Height:      ? ? ?Intake/Output Summary (Last 24 hours) at 08/15/2021 0842 ?Last data filed at 08/15/2021 0600 ?Gross per 24 hour  ?Intake 400 ml  ?Output 3400 ml  ?Net -3000 ml  ? ?Last 3 Weights 08/13/2021 08/12/2021 08/11/2021  ?Weight (lbs) 167 lb 12.3 oz 167 lb 5.3 oz 168 lb  ?Weight (kg) 76.1 kg 75.9 kg 76.204 kg  ?   ? ?Telemetry  ?  ?Atrial fibrillation under good control- Personally Reviewed ? ?ECG  ?  ?No new- Personally Reviewed ? ?Physical Exam  ? ?GEN: No acute distress.   ?Neck: No JVD, facial ecchymoses from fall ?Cardiac: Irregularly irregular, 3/6 systolic murmur no rubs, or gallops.  ?Respiratory: Clear to auscultation bilaterally. ?GI: Soft, nontender, non-distended  ?MS: Mild lower extremity  edema; No deformity. ?Neuro:  Nonfocal  ?Psych: Normal affect  ? ?Labs  ?  ?High Sensitivity Troponin:   ?Recent Labs  ?Lab 08/11/21 ?1532 08/11/21 ?1800 08/12/21 ?0107  ?TROPONINIHS 255* 262* 218*  ?   ?Chemistry ?Recent Labs  ?Lab 08/11/21 ?1521 08/12/21 ?0107 08/13/21 ?0128 08/14/21 ?0008 08/15/21 ?0008  ?NA 128*   < > 129* 125* 127*  ?K 3.9   < > 3.7 4.3 3.6  ?CL 98   < > 98 94* 96*  ?CO2 21*   < > 19* 20* 22  ?GLUCOSE 130*   < > 103* 96 100*  ?BUN 21   < > 34* 40* 35*  ?CREATININE 1.18   < > 1.62* 1.50* 1.31*  ?CALCIUM 8.4*   < > 8.0* 8.1* 8.2*  ?MG 2.5*  --  2.1 2.3 2.3  ?PROT 6.5  --  5.6*  --   --   ?ALBUMIN 3.5  --  2.8*  --   --   ?AST 35  --  22  --   --   ?ALT 28  --  21  --   --   ?ALKPHOS 96  --  79  --   --   ?  BILITOT 2.2*  --  2.3* 1.7*  --   ?GFRNONAA >60   < > 42* 46* 54*  ?ANIONGAP 9   < > '12 11 9  '$ ? < > = values in this interval not displayed.  ?  ?Lipids  ?Recent Labs  ?Lab 08/12/21 ?0107  ?CHOL 176  ?TRIG 88  ?HDL 26*  ?LDLCALC 132*  ?CHOLHDL 6.8  ?  ?Hematology ?Recent Labs  ?Lab 08/12/21 ?0107 08/13/21 ?0128 08/14/21 ?0008  ?WBC 12.4* 13.4* 9.9  ?RBC 3.22* 2.94* 2.93*  ?HGB 10.6* 9.5* 9.5*  ?HCT 30.5* 27.8* 28.8*  ?MCV 94.7 94.6 98.3  ?MCH 32.9 32.3 32.4  ?MCHC 34.8 34.2 33.0  ?RDW 14.6 14.9 14.7  ?PLT 298 258 276  ? ?Thyroid  ?Recent Labs  ?Lab 08/11/21 ?1837  ?TSH 1.022  ?FREET4 1.54*  ?  ?BNP ?Recent Labs  ?Lab 08/11/21 ?1521  ?BNP 1,049.1*  ?  ?DDimer  ?Recent Labs  ?Lab 08/11/21 ?1532  ?DDIMER 3.91*  ?  ? ? ? ?Patient Profile  ?   ?86 y.o. male with decompensated diastolic heart failure with moderate aortic stenosis moderate aortic insufficiency with history of prostate cancer hyperlipidemia squamous cell carcinoma of scalp history of falls most recently 2 weeks ago. ? ?Assessment & Plan  ?  ?Severe aortic stenosis/moderate aortic insufficiency ?-Personally reviewed echocardiogram.  ?- Heavily calcified valve on echocardiogram with mean gradient of 54mHg on Pedoff probe.  ?- Dr. BHarl Bowie had discussion with he and his son regarding possible TAVR.  They decided not to pursue further consideration.  Continue to monitor. ? ?New onset atrial fibrillation paroxysmal ?- Rates under good control.  Continue with rate control strategy with metoprolol 75 mg XL daily. ?-Had long discussion about risks and benefits of chronic anticoagulation.  Given his recent fall, fairly traumatic, risks of anticoagulation likely outweigh benefits. ? ?Dizziness ?- Has been longstanding over the past year.  His daughter states that he has been battling with this.  Has good days and bad days.  Sees a neurologist.  Has been on aspirin 81 mg. ? ? ?Diastolic heart failure ?- BNP 1000.  No pulmonary effusion on CT.  Does not appear significantly overloaded.  Significant lower extremity edema on arrival. ?Has received dose of IV Lasix 60 mg.  Now on 40 mg once a day.  Improved. ? ?Hyponatremia ?- Per primary team. ? ?Seems reasonable for discharge to skilled nursing facility. ?Spoke with son and daughter on phone.  Obviously, now is not the right time to have structural heart team see him for potential TAVR.  In follow-up, this can be further discussed with Dr. MPhineas Inchesas an outpatient. ? ? ?For questions or updates, please contact [facility]?Please consult [email or web address] for contact info under  ? ?  ?   ?Signed, ?MCandee Furbish MD  ?08/15/2021, 8:42 AM   ? ?

## 2021-08-15 NOTE — TOC Progression Note (Addendum)
Transition of Care (TOC) - Progression Note  ? ? ?Patient Details  ?Name: Guy Hutchinson ?MRN: [id number] ?Date of Birth: Mar 04, 1936 ? ?Transition of Care (TOC) CM/SW Contact  ?Coralee Pesa, LCSWA ?Phone Number: ?08/15/2021, 10:55 AM ? ?Clinical Narrative:    ?4:15- CSW contacted the insurance and they are still reviewing the case at this time. CSW notified medical team and facility. CSW attempted to notify pt's son, VM was full. Plan to DC tomorrow. ?12:30 Authorization pending. ?CSW spoke with pt and son at bedside. They were advised that pt is medically ready and provided with bed offers. Pt and family chose [facility] confirmed they have a bed when he is ready and will not need a covid test. ?Pt will need a new PT note prior to starting insurance authorization. Medical team notified, TOC will continue to follow for DC needs. ? ? ?Expected Discharge Plan: [place] ?Barriers to Discharge: Ship broker, Continued Medical Work up, SNF Pending bed offer ? ?Expected Discharge Plan and Services ?Expected Discharge Plan: [place] ?  ?  ?  ?Living arrangements for the past 2 months: [place] ?Expected Discharge Date: 08/15/21               ?  ?  ?  ?  ?  ?  ?  ?  ?  ?  ? ? ?Social Determinants of Health (SDOH) Interventions ?  ? ?Readmission Risk Interventions ?No flowsheet data found. ? ?

## 2021-08-15 NOTE — Progress Notes (Signed)
Dr. Marlou Porch requested 1 mo f/u, appt arranged and placed on AVS. ?

## 2021-08-15 NOTE — Progress Notes (Signed)
Physical Therapy Treatment ?Patient Details ?Name: Guy Hutchinson ?MRN: [id number] ?DOB: 02/08/1936 ?Today's Date: 08/15/2021 ? ? ?History of Present Illness 86 yo male admitted 3/15 with SOB and Afib. PMhx:prostate CA, HLD, SCC scalp, ETOH use, glaucoma and h/o falls (most recently 07/31/21) ? ?  ?PT Comments  ? ? Pt pleasant with maintained lack of awareness for safety and deficits. Pt continues to perseverate on wanting his herbs from home. Pt with increased gait tolerance this session but continues to have impaired balance and safety. Pt educated for progression and HEP.  ? ?HR 93 ?95% on RA ?  ?Recommendations for follow up therapy are one component of a multi-disciplinary discharge planning process, led by the attending physician.  Recommendations may be updated based on patient status, additional functional criteria and insurance authorization. ? ?Follow Up Recommendations ? Skilled nursing-short term rehab (<3 hours/day) ?  ?  ?Assistance Recommended at Discharge Frequent or constant Supervision/Assistance  ?Patient can return home with the following A little help with walking and/or transfers;A little help with bathing/dressing/bathroom;Assistance with cooking/housework;Direct supervision/assist for financial management;Assist for transportation;Direct supervision/assist for medications management;Help with stairs or ramp for entrance ?  ?Equipment Recommendations ? BSC/3in1  ?  ?Recommendations for Other Services   ? ? ?  ?Precautions / Restrictions Precautions ?Precautions: Fall  ?  ? ?Mobility ? Bed Mobility ?Overal bed mobility: Needs Assistance ?Bed Mobility: Supine to Sit ?  ?  ?Supine to sit: Supervision, HOB elevated ?  ?  ?General bed mobility comments: HOb 20 degrees with increased time and supervision for lines to transition to sitting ?  ? ?Transfers ?Overall transfer level: Needs assistance ?  ?Transfers: Sit to/from Stand ?Sit to Stand: Min assist ?  ?  ?  ?  ?  ?General transfer comment: min  assist to rise from bed, minguard to rise from toilet with rail with cues for hand placement. Assist for pericare in standing ?  ? ?Ambulation/Gait ?Ambulation/Gait assistance: Min guard ?Gait Distance (Feet): 100 Feet ?Assistive device: Rolling walker (2 wheels) ?Gait Pattern/deviations: Step-through pattern, Decreased stride length, Trunk flexed ?  ?Gait velocity interpretation: <1.8 ft/sec, indicate of risk for recurrent falls ?  ?General Gait Details: cues for safety, posture and direction with assist for line management. pt walked 8' then 100' after seated rest to toilet. limited by fatigue. ? ? ?Stairs ?  ?  ?  ?  ?  ? ? ?Wheelchair Mobility ?  ? ?Modified Rankin (Stroke Patients Only) ?  ? ? ?  ?Balance Overall balance assessment: Needs assistance, History of Falls ?Sitting-balance support: No upper extremity supported, Feet supported ?Sitting balance-Leahy Scale: Fair ?Sitting balance - Comments: EOB and toilet without LOB ?  ?Standing balance support: Bilateral upper extremity supported ?Standing balance-Leahy Scale: Poor ?Standing balance comment: RW for gait ?  ?  ?  ?  ?  ?  ?  ?  ?  ?  ?  ?  ? ?  ?Cognition Arousal/Alertness: Awake/alert ?Behavior During Therapy: Flat affect ?Overall Cognitive Status: Impaired/Different from baseline ?Area of Impairment: Safety/judgement, Problem solving ?  ?  ?  ?  ?  ?  ?  ?  ?  ?  ?  ?  ?Safety/Judgement: Decreased awareness of safety, Decreased awareness of deficits ?  ?Problem Solving: Slow processing ?General Comments: perseverating on calling son about bills and getting his herbs from home ?  ?  ? ?  ?Exercises General Exercises - Lower Extremity ?Long Arc Quad: AROM, Both, Seated, 15  reps ?Hip Flexion/Marching: AROM, Both, Seated, 15 reps ? ?  ?General Comments   ?  ?  ? ?Pertinent Vitals/Pain Pain Assessment ?Pain Score: 3  ?Pain Location: bil knee ?Pain Descriptors / Indicators: Aching, Sore ?Pain Intervention(s): Limited activity within patient's tolerance,  Monitored during session, Repositioned  ? ? ?Home Living   ?  ?  ?  ?  ?  ?  ?  ?  ?  ?   ?  ?Prior Function    ?  ?  ?   ? ?PT Goals (current goals can now be found in the care plan section) Progress towards PT goals: Progressing toward goals ? ?  ?Frequency ? ? ? Min 3X/week ? ? ? ?  ?PT Plan Current plan remains appropriate  ? ? ?Co-evaluation   ?  ?  ?  ?  ? ?  ?AM-PAC PT "6 Clicks" Mobility   ?Outcome Measure ? Help needed turning from your back to your side while in a flat bed without using bedrails?: A Little ?Help needed moving from lying on your back to sitting on the side of a flat bed without using bedrails?: A Little ?Help needed moving to and from a bed to a chair (including a wheelchair)?: A Little ?Help needed standing up from a chair using your arms (e.g., wheelchair or bedside chair)?: A Little ?Help needed to walk in hospital room?: A Lot ?Help needed climbing 3-5 steps with a railing? : Total ?6 Click Score: 15 ? ?  ?End of Session Equipment Utilized During Treatment: Gait belt ?Activity Tolerance: Patient tolerated treatment well ?Patient left: in chair;with call bell/phone within reach;with chair alarm set ?Nurse Communication: Mobility status ?PT Visit Diagnosis: Other abnormalities of gait and mobility (R26.89);Difficulty in walking, not elsewhere classified (R26.2);Muscle weakness (generalized) (M62.81) ?  ? ? ?Time: 9024-0973 ?PT Time Calculation (min) (ACUTE ONLY): 30 min ? ?Charges:  $Gait Training: 8-22 mins ?$Therapeutic Exercise: 8-22 mins          ?          ? ?Darron Stuck P, PT ?Acute Rehabilitation Services ?Pager: [phone] ?Office: [phone] ? ? ? ?Abdoul Encinas B Horice Carrero ?08/15/2021, 12:14 PM ? ?

## 2021-08-15 NOTE — Plan of Care (Signed)
?  Problem: Cardiac: ?Goal: Ability to achieve and maintain adequate cardiopulmonary perfusion will improve ?Outcome: Progressing ?  ?Problem: Education: ?Goal: Knowledge of General Education information will improve ?Description: Including pain rating scale, medication(s)/side effects and non-pharmacologic comfort measures ?Outcome: Progressing ?  ?Problem: Clinical Measurements: ?Goal: Will remain free from infection ?Outcome: Progressing ?  ?Problem: Clinical Measurements: ?Goal: Diagnostic test results will improve ?Outcome: Progressing ?  ?Problem: Clinical Measurements: ?Goal: Respiratory complications will improve ?Outcome: Progressing ?  ?Problem: Clinical Measurements: ?Goal: Cardiovascular complication will be avoided ?Outcome: Progressing ?  ?

## 2021-08-16 DIAGNOSIS — I4811 Longstanding persistent atrial fibrillation: Secondary | ICD-10-CM

## 2021-08-16 NOTE — Plan of Care (Signed)

## 2021-08-16 NOTE — TOC Transition Note (Signed)
Transition of Care (TOC) - CM/SW Discharge Note ? ? ?Patient Details  ?Name: Guy Hutchinson ?MRN: [id number] ?Date of Birth: December 16, 1935 ? ?Transition of Care (TOC) CM/SW Contact:  ?Reece Agar, LCSWA ?Phone Number: ?08/16/2021, 12:05 PM ? ? ?Clinical Narrative:    ?Patient will DC to: [facility] ?Anticipated DC date: 08/16/2021 ?Family notified: Pt son ?Transport by: Corey Coulton ? ? ?Per MD patient ready for DC to [facility]. RN to call report prior to discharge [phone]). RN, patient, patient's family, and facility notified of DC. Discharge Summary and FL2 sent to facility. DC packet on chart. Ambulance transport requested for patient.  ? ?CSW will sign off for now as social work intervention is no longer needed. Please consult [place] again if new needs arise. ? ? ? ? ?  ?Barriers to Discharge: Ship broker, Continued Medical Work up, SNF Pending bed offer ? ? ?Patient Goals and CMS Choice ?  ?CMS Medicare.gov Compare Post Acute Care list provided to:: Patient ?Choice offered to / list presented to : Patient ? ?Discharge Placement ?  ?           ?  ?  ?  ?  ? ?Discharge Plan and Services ?  ?  ?           ?  ?  ?  ?  ?  ?  ?  ?  ?  ?  ? ?Social Determinants of Health (SDOH) Interventions ?  ? ? ?Readmission Risk Interventions ?No flowsheet data found. ? ? ? ? ?

## 2021-08-16 NOTE — Discharge Summary (Signed)
?Physician Discharge Summary ?  ?Patient: Guy Hutchinson MRN: [id number] DOB: Mar 30, 1936  ?Admit date:     08/11/2021  ?Discharge date: 08/16/2021  (delayed DC on 08/15/2021 by insurance authorization for SNF)  ?Discharge Physician: Patrecia Pour  ? ?PCP: Wendie Agreste, MD  ? ?Recommendations at discharge:  ?Follow up with cardiology as scheduled. Could consider TAVR evaluation in the future, not currently desired by pt. ?Continue routine PCP follow up. ?Recheck BMP in the next week with attention to renal function and hyponatremia. ?Continue wound care for weeping LE edema. ? ?Discharge Diagnoses: ?Principal Problem: ?  Atrial fibrillation ([place]) ?Active Problems: ?  Elevated troponin ?  Hyponatremia ?  Bilateral lower extremity edema ?  Baker's cyst of knee, right ?  Pure hypercholesterolemia ?  Alcohol use ?  Hypervolemia ?  Acute heart failure with preserved ejection fraction (HFpEF) ([place]) ?  Hypokalemia ?  Hyperbilirubinemia ?  AKI (acute kidney injury) ([place]) ?  Aortic valve disease ?  Pressure injury of skin ? ?Resolved Problems: ?  History of prostate cancer ? ?Hospital Course: ?Guy Hutchinson is an 86 y.o. male with a history of HLD, prostate CA, recurrent falls, daily alcohol use who presented to the ED 3/16 with leg swelling, dyspnea worse with exertion, and worsening weakness and falls. He appeared peripherally volume overloaded consistent with CHF, and with new atrial fibrillation. IV diuresis, metoprolol, IV heparin were given, and cardiology consult pursued. Echo reveals aortic valve disease for which patient and family decline TAVR consideration at this time. The patient's severe history of falls with severe ecchymoses and untreated aortic valve disorder are considered to be relative contraindications to ongoing anticoagulation and the patient and family confirm their desire to avoid blood thinners. The patient will require rehabilitation at SNF. ? ?Assessment and Plan: ?* Atrial fibrillation  ([place]) ?New diagnosis. Remains in rate-controlled AFib.  ?- Continue metoprolol, converted to succinate '75mg'$  daily.  ?- Risk of fall with severe bleeding on anticoagulation appears to exceed the relative risk reduction in stroke for this patient long term. Both medical and cardiology teams have discussed with patient/family and we will not continue this.   ?- Maintain K and Mg levels ? ?Elevated troponin ?Troponin 255>265 without chest pain or ischemic ST changes on ECG. Pt has extensive coronary calcifications on CT, but presentation is not consistent with ACS at this time.  ?- Cardiology not currently planning intervention/work up as inpatient. ?- Continue BB, statin, aspirin  ? ?Hyponatremia ?Likely hypervolemia hyponatremia in setting of new afib with volume overload. Remains low but asymptomatic. May be chronic as well, was low in 2018 and 2021 (available data). ?- Continue lasix as above ?- Implement fluid restriction ?- Recheck within the next week. If persistent would need further work up. ? ?Bilateral lower extremity edema ?BNP >1000, echo pending. Edema is likely multifactorial inclusive of venous stasis based on hyperpigmentation (in addition to bruising). No DVT on U/S.  ?- WOC consulted for weeping wounds. ? ?Baker's cyst of knee, right ?- Follow up prn ? ?Pure hypercholesterolemia ?LDL 132, HDL 23.  ?- Given extensive coronary calcifications, would qualify for statin. LFTs wnl. ? ?Aortic valve disease ?Echo reveals moderate AR, severe AS. Suspect this is symptomatic/cause of lightheadedness and falls. Pt/family decline consideration for TAVR. ? ?AKI (acute kidney injury) ([place]) ?Due to diuresis which we've deescalated with subsequent stabilization.  ?- Continues to improve, recheck at follow up ? ?Hyperbilirubinemia ?In the absence of evidence of hemolysis, unconjugated hyperbili in  this setting is most consistent with Gilbert's. Also possibly related to widespread ecchymoses. ? ?Hypokalemia ?-  Supplemented ?- Monitor  ? ?Acute heart failure with preserved ejection fraction (HFpEF) ([place]) ?LVEF 60-65%, mod LVH with basal-inferior hypokinesis, indeterminate diastolic function (echo during atrial fibrillation), dilated IVC.  ?- Continue oral diuretic and monitoring UOP, weights.  ?- Cardiac diet ?- Note wt up at 76kg from priors in 2021 in mid 60's, though no orthopnea/pulmonary edema currently evident. ? ?Leukocytosis ?Resolved without antimicrobial therapy, suspected to be reactive in setting of no fever or nidus of infection identified. ? ?Alcohol use ?Daily use, unclear quantity. No evidence of withdrawal during hospitalization, stopped CIWA. ? ?Consultants: Cardiology ?Procedures performed: None  ?Disposition: Skilled nursing facility ?Diet recommendation:  ?Cardiac diet ?DISCHARGE MEDICATION: ?Allergies as of 08/16/2021   ?No Known Allergies ?  ? ?  ?Medication List  ?  ? ?STOP taking these medications   ? ?ALPRAZolam 0.5 MG tablet ?Commonly known as: XANAX ?  ? ?  ? ?TAKE these medications   ? ?ARNICA EX ?Apply 1 application. topically in the morning and at bedtime. ?  ?aspirin 81 MG EC tablet ?Take 1 tablet (81 mg total) by mouth daily. Swallow whole. ?  ?dorzolamide-timolol 22.3-6.8 MG/ML ophthalmic solution ?Commonly known as: COSOPT ?Place 1 drop into both eyes 2 (two) times daily. ?  ?furosemide 40 MG tablet ?Commonly known as: LASIX ?Take 1 tablet (40 mg total) by mouth daily. ?  ?GARLIC PO ?Take 1 capsule by mouth daily. ?  ?Glucosamine Chond Double Str Caps ?Take 1 capsule by mouth daily. ?  ?latanoprost 0.005 % ophthalmic solution ?Commonly known as: XALATAN ?Place 1 drop into both eyes at bedtime. ?  ?metoprolol succinate 25 MG 24 hr tablet ?Commonly known as: TOPROL-XL ?Take 3 tablets (75 mg total) by mouth daily. ?  ?MILK THISTLE PO ?Take 1 capsule by mouth every morning. ?  ?multivitamin with minerals Tabs tablet ?Take 1 tablet by mouth every other day. ?  ?OLIVE LEAF PO ?Take 400 mg by  mouth daily. ?  ?OVER THE COUNTER MEDICATION ?Take 1 capsule by mouth every morning. Cats Claw ?  ?Turmeric 400 MG Caps ?Take 1 capsule by mouth daily. ?  ?UNABLE TO FIND ?Take 1 capsule by mouth daily. Med Name: paudarco ?  ?UNABLE TO FIND ?Take 1 capsule by mouth daily. Med Name: elderberry ?  ?Vitamin C 500 MG Caps ?Take 1 capsule by mouth daily. ?  ? ?  ? ? Contact information for follow-up providers   ? ? Janina Mayo, MD Follow up.   ?Specialty: Cardiology ?Why: [place] location - cardiology follow-up appointment is scheduled for Friday Sep 09, 2021 at 8:20 AM (Arrive by 8:05 AM). ?Contact information: ?[address] ?[place] [address] ?[phone] ? ? ?  ?  ? ? Wendie Agreste, MD Follow up.   ?Specialties: Family Medicine, Sports Medicine ?Contact information: ?[address] ?[place] [address] ?[phone] ? ? ?  ?  ? ?  ?  ? ? Contact information for after-discharge care   ? ? Destination   ? ? [facility] .   ?Service: Skilled Nursing ?Contact information: ?[address] ?[place] ?[phone] ? ?  ?  ? ?  ?  ? ?  ?  ? ?  ? ?Subjective: No complaints this morning. No chest pain or dyspnea or palpitations.  ? ?Discharge Exam: ?BP 134/77 (BP Location: Right Arm)  Pulse 89   Temp (!) 97.3 ?F (36.3 ?C) (Oral)   Resp (!) 25   Ht '5\' 10"'$  (1.778 m)   Wt 72.5 kg   SpO2 97%   BMI 22.93 kg/m?   ?Pleasant, elderly male with resolving ecchymoses in legs and face.  ?Irreg irreg with 3/6 holosystolic whining murmur, stable LE edema.  ?Clear, nonlabored ?Dressing on legs c/d/i ? ?Condition at discharge: stable ? ?The results of significant diagnostics from this hospitalization (including imaging, microbiology, ancillary and laboratory) are listed below for reference.  ? ?Imaging Studies: ?DG Chest 2 View ? ?Result Date: 08/11/2021 ?CLINICAL DATA:  Shortness of breath. EXAM: CHEST - 2 VIEW COMPARISON:  Chest x-ray  07/31/2021. FINDINGS: There is a vague focal nodular density in the right upper lobe measuring 2 cm overlying the anterior right second rib lungs are otherwise clear. Cardiomediastinal silhouette is stable and within normal limi

## 2021-08-16 NOTE — Progress Notes (Signed)
? ?Progress Note ? ?Patient Name: Guy Hutchinson ?Date of Encounter: 08/16/2021 ? ?[facility] Cardiologist: Janina Mayo, MD  ? ?Subjective  ? ?Laying comfortably in bed.  [place].  No significant shortness of breath, no chest pain.  Conversant.  Awaiting skilled nursing facility. ? ?Inpatient Medications  ?  ?Scheduled Meds: ? vitamin C  500 mg Oral Daily  ? aspirin EC  81 mg Oral Daily  ? cholecalciferol  1,000 Units Oral QHS  ? dorzolamide-timolol  1 drop Both Eyes BID  ? enoxaparin (LOVENOX) injection  40 mg Subcutaneous S06T  ? folic acid  1 mg Oral Daily  ? furosemide  40 mg Oral Daily  ? latanoprost  1 drop Both Eyes QHS  ? metoprolol succinate  75 mg Oral Daily  ? multivitamin with minerals  1 tablet Oral Daily  ? sodium chloride flush  3 mL Intravenous Q12H  ? thiamine  100 mg Oral Daily  ? Or  ? thiamine  100 mg Intravenous Daily  ? ?Continuous Infusions: ? sodium chloride    ? ?PRN Meds: ?sodium chloride, acetaminophen **OR** acetaminophen, melatonin, sodium chloride flush  ? ?Vital Signs  ?  ?Vitals:  ? 08/15/21 2310 08/16/21 0335 08/16/21 0515 08/16/21 0756  ?BP: 113/66 (!) 115/59  134/77  ?Pulse: 81 94  89  ?Resp: 15 20  (!) 25  ?Temp: 97.6 ?F (36.4 ?C) (!) 97.3 ?F (36.3 ?C)    ?TempSrc: Oral Oral    ?SpO2: 96% 97%  97%  ?Weight:   72.5 kg   ?Height:      ? ? ?Intake/Output Summary (Last 24 hours) at 08/16/2021 0852 ?Last data filed at 08/16/2021 0335 ?Gross per 24 hour  ?Intake 243 ml  ?Output 550 ml  ?Net -307 ml  ? ?Last 3 Weights 08/16/2021 08/13/2021 08/12/2021  ?Weight (lbs) 159 lb 13.3 oz 167 lb 12.3 oz 167 lb 5.3 oz  ?Weight (kg) 72.5 kg 76.1 kg 75.9 kg  ?   ? ?Telemetry  ?  ?Atrial fibrillation under good rate control, no changes- Personally Reviewed ? ?ECG  ?  ?No new- Personally Reviewed ? ?Physical Exam  ? ?GEN: No acute distress.   ?Neck: No JVD, acial ecchymosis from fall ?Cardiac: Irregularly irregular, 3/6 systolic murmur no rubs, or gallops.  ?Respiratory: Clear to auscultation  bilaterally. ?GI: Soft, nontender, non-distended  ?MS: Mild lower extremity edema; No deformity. ?Neuro:  Nonfocal  ?Psych: Normal affect  ? ?Labs  ?  ?High Sensitivity Troponin:   ?Recent Labs  ?Lab 08/11/21 ?1532 08/11/21 ?1800 08/12/21 ?0107  ?TROPONINIHS 255* 262* 218*  ?   ?Chemistry ?Recent Labs  ?Lab 08/11/21 ?1521 08/12/21 ?0107 08/13/21 ?0128 08/14/21 ?0008 08/15/21 ?0008  ?NA 128*   < > 129* 125* 127*  ?K 3.9   < > 3.7 4.3 3.6  ?CL 98   < > 98 94* 96*  ?CO2 21*   < > 19* 20* 22  ?GLUCOSE 130*   < > 103* 96 100*  ?BUN 21   < > 34* 40* 35*  ?CREATININE 1.18   < > 1.62* 1.50* 1.31*  ?CALCIUM 8.4*   < > 8.0* 8.1* 8.2*  ?MG 2.5*  --  2.1 2.3 2.3  ?PROT 6.5  --  5.6*  --   --   ?ALBUMIN 3.5  --  2.8*  --   --   ?AST 35  --  22  --   --   ?ALT 28  --  21  --   --   ?  ALKPHOS 96  --  79  --   --   ?BILITOT 2.2*  --  2.3* 1.7*  --   ?GFRNONAA >60   < > 42* 46* 54*  ?ANIONGAP 9   < > '12 11 9  '$ ? < > = values in this interval not displayed.  ?  ?Lipids  ?Recent Labs  ?Lab 08/12/21 ?0107  ?CHOL 176  ?TRIG 88  ?HDL 26*  ?LDLCALC 132*  ?CHOLHDL 6.8  ?  ?Hematology ?Recent Labs  ?Lab 08/12/21 ?0107 08/13/21 ?0128 08/14/21 ?0008  ?WBC 12.4* 13.4* 9.9  ?RBC 3.22* 2.94* 2.93*  ?HGB 10.6* 9.5* 9.5*  ?HCT 30.5* 27.8* 28.8*  ?MCV 94.7 94.6 98.3  ?MCH 32.9 32.3 32.4  ?MCHC 34.8 34.2 33.0  ?RDW 14.6 14.9 14.7  ?PLT 298 258 276  ? ?Thyroid  ?Recent Labs  ?Lab 08/11/21 ?1837  ?TSH 1.022  ?FREET4 1.54*  ?  ?BNP ?Recent Labs  ?Lab 08/11/21 ?1521  ?BNP 1,049.1*  ?  ?DDimer  ?Recent Labs  ?Lab 08/11/21 ?1532  ?DDIMER 3.91*  ?  ? ? ? ?Patient Profile  ?   ?86 y.o. male with decompensated diastolic heart failure with moderate aortic stenosis moderate aortic insufficiency with history of prostate cancer hyperlipidemia squamous cell carcinoma of scalp history of falls most recently 2 weeks ago. ? ?Assessment & Plan  ?  ?Severe aortic stenosis/moderate aortic insufficiency ?-Personally reviewed echocardiogram.  ?- Heavily calcified valve on  echocardiogram with mean gradient of 51mHg on Pedoff probe.  ?- Dr. BHarl Bowieand I had discussion with he and his son/daughter on phone regarding possible TAVR.  At this time, he should heal from his fall.  At a later point as an outpatient, 1 could consider TAVR therapy.  Structural team would have to deem him appropriate. ? ?New onset atrial fibrillation paroxysmal ?- Rates under good control.  Continue with rate control strategy with metoprolol 75 mg XL daily. ?-Had long discussion about risks and benefits of chronic anticoagulation.  Given his recent fall, fairly traumatic, risks of anticoagulation likely outweigh benefits. ? ?Dizziness ?- Has been longstanding over the past year.  His daughter states that he has been battling with this.  Has good days and bad days.  Sees a neurologist.  Has been on aspirin 81 mg.  He fell while going up stairs, missed the last step. ? ? ?Diastolic heart failure ?- BNP 1000.  No pulmonary effusion on CT.  Does not appear significantly overloaded.  Significant lower extremity edema on arrival. ?Has received dose of IV Lasix 60 mg.  Now on 40 mg once a day.  Improved.  No changes ? ?Hyponatremia ?- Per primary team.  Stable at 127 ? ?Seems reasonable again for discharge to skilled nursing facility. ?Spoke with son and daughter on phone.  Obviously, now is not the right time to have structural heart team see him for potential TAVR.  In follow-up, this can be further discussed with Dr. MPhineas Inchesas an outpatient.  This has been arranged. ? ? ?For questions or updates, please contact [facility]?Please consult [email or web address] for contact info under  ? ?  ?   ?Signed, ?MCandee Furbish MD  ?08/16/2021, 8:52 AM   ? ?

## 2021-08-16 NOTE — Progress Notes (Signed)
Patient refusing dressing changes at this time. ?

## 2021-08-16 NOTE — Progress Notes (Addendum)
27 RN attempted to call report to [facility]. [phone].  ?[phone] attempted x2 ?[phone] attempted x3.  ? ?No answer after transferred from main desk to receiving RN x3.  ?

## 2021-09-09 ENCOUNTER — Ambulatory Visit: Payer: Medicare Other | Admitting: Internal Medicine

## 2021-09-09 ENCOUNTER — Encounter: Payer: Self-pay | Admitting: Internal Medicine

## 2021-09-09 VITALS — BP 122/62 | HR 101 | Ht 70.0 in | Wt 154.4 lb

## 2021-09-09 DIAGNOSIS — I5031 Acute diastolic (congestive) heart failure: Secondary | ICD-10-CM | POA: Diagnosis not present

## 2021-09-09 DIAGNOSIS — I351 Nonrheumatic aortic (valve) insufficiency: Secondary | ICD-10-CM

## 2021-09-09 MED ORDER — METOPROLOL SUCCINATE ER 100 MG PO TB24: 100.0000 mg | ORAL_TABLET | Freq: Every day | ORAL | 1 refills | Status: DC

## 2021-09-09 NOTE — Patient Instructions (Signed)
Medication Instructions:  ?Increase Metoprolol 100 mg daily.  ? ?*If you need a refill on your cardiac medications before your next appointment, please call your pharmacy* ? ? ?Follow-Up: ?At [facility], you and your health needs are our priority.  As part of our continuing mission to provide you with exceptional heart care, we have created designated Provider Care Teams.  These Care Teams include your primary Cardiologist (physician) and Advanced Practice Providers (APPs -  Physician Assistants and Nurse Practitioners) who all work together to provide you with the care you need, when you need it. ? ?We recommend signing up for the patient portal called "MyChart".  Sign up information is provided on this After Visit Summary.  MyChart is used to connect with patients for Virtual Visits (Telemedicine).  Patients are able to view lab/test results, encounter notes, upcoming appointments, etc.  Non-urgent messages can be sent to your provider as well.   ?To learn more about what you can do with MyChart, go to [email or web address].   ? ?Your next appointment:   ?3 month(s) ? ?The format for your next appointment:   ?In Person ? ?Provider:   ?Janina Mayo, MD   ? ? ?Other Instructions ?Referral to Structural Heart Team - they will contact you for an appointment.  ? ? ?Important Information About Sugar ? ? ? ? ? ? ?

## 2021-09-09 NOTE — Progress Notes (Signed)
?Cardiology Office Note:   ? ?Date:  09/09/2021  ? ?ID:  Guy Hutchinson, DOB May 04, 1936, MRN [id number] ? ?PCP:  Wendie Agreste, MD ?  ?[facility] Providers ?Cardiologist:  Janina Mayo, MD    ? ?Referring MD: Wendie Agreste, MD  ? ?No chief complaint on file. ?Follow up ? ?History of Present Illness:   ? ?86 y.o. male with decompensated diastolic heart failure with moderate aortic stenosis moderate aortic insufficiency with history of prostate cancer hyperlipidemia squamous cell carcinoma of scalp history of falls most recently 2 weeks ago. ? ?Saw him in the hospital after a fall. He was found to have afib and severe AS. Plan was to continue rate control. Considering his frequent falls, decision was made to forgo anticoagulation. Additionally, myself and Dr. Marlou Porch discussed TAVR with his family and it was decided that he was high risk at the time post fall, and can re-assess his candidacy as an outpatient. ? ?No recent falls. He had COVID on 08/19/2021. He feels better. He doesn't feel palpitations. He gets a little dizzy. His daughter and son were in the room and she had many questions about his disease state and possible interventions. She wanted to be referred to the structural team to determine if TAVR was feasible.  ? ?Cardiology Studies ?TTE- EF is normal. AV mean gradient 58 mmHg, Severely calcified. Area likely overestimated ? ?Past Medical History:  ?Diagnosis Date  ? Cataract   ? Dizziness 04/11/2017  ? Glaucoma   ? Prostate cancer ([place])   ? ? ?Past Surgical History:  ?Procedure Laterality Date  ? PROSTATE SURGERY  2007  ? Impant  ? ? ?Current Medications: ?No outpatient medications have been marked as taking for the 09/09/21 encounter (Appointment) with Janina Mayo, MD.  ?  ? ?Allergies:   Patient has no known allergies.  ? ?Social History  ? ?Socioeconomic History  ? Marital status: Divorced  ?  Spouse name: Not on file  ? Number of children: 2  ? Years of education: 39  ? Highest  education level: Bachelor's degree (e.g., BA, AB, BS)  ?Occupational History  ? Occupation: Retired  ?Tobacco Use  ? Smoking status: Never  ? Smokeless tobacco: Never  ?Vaping Use  ? Vaping Use: Never used  ?Substance and Sexual Activity  ? Alcohol use: Yes  ?  Alcohol/week: 14.0 standard drinks  ?  Types: 14 Cans of beer per week  ? Drug use: No  ? Sexual activity: Yes  ?Other Topics Concern  ? Not on file  ?Social History Narrative  ? Marital status; Divorced.  Dating same male x 2000.  ?    Children:  ?    Lives:  Alone in 2019.  ?    Tobacco: none; in 2019; quit in 1978.  ?    Alcohol:  Drinks 2-3 miller lights per day.    ?    Exercise:  Plays golf two days per week; walks around in malls.  ?    ADLs; drives; performs ADLs; no assistant devices.  ?    Advanced Directives:  None in 2019.  HCPOA:  Son/[employer].  FULL CODE; no prolonged measures.    ? Right handed   ? ?Social Determinants of Health  ? ?Financial Resource Strain: Not on file  ?Food Insecurity: Not on file  ?Transportation Needs: Not on file  ?Physical Activity: Not on file  ?Stress: Not on file  ?Social Connections: Not on file  ?  ? ?  Family History: ?The patient's family history includes Diabetes in his mother; Heart disease in his brother. ? ?ROS:   ?Please see the history of present illness.    ? All other systems reviewed and are negative. ? ?EKGs/Labs/Other Studies Reviewed:   ? ?The following studies were reviewed today: ? ? ?EKG:  EKG is  ordered today.  The ekg ordered today demonstrates  ? ?Atrial fibrillation HR 100s ? ?Recent Labs: ?08/11/2021: B Natriuretic Peptide 1,049.1; TSH 1.022 ?08/13/2021: ALT 21 ?08/14/2021: Hemoglobin 9.5; Platelets 276 ?08/15/2021: BUN 35; Creatinine, Ser 1.31; Magnesium 2.3; Potassium 3.6; Sodium 127  ?Recent Lipid Panel ?   ?Component Value Date/Time  ? CHOL 176 08/12/2021 0107  ? CHOL 194 01/15/2020 1529  ? TRIG 88 08/12/2021 0107  ? HDL 26 (L) 08/12/2021 0107  ? HDL 49 01/15/2020 1529  ? CHOLHDL 6.8  08/12/2021 0107  ? VLDL 18 08/12/2021 0107  ? [place] 132 (H) 08/12/2021 0107  ? LDLCALC 125 (H) 01/15/2020 1529  ? ? ? ?Risk Assessment/Calculations:   ? ?CHA2DS2-VASc Score = 3  ? This indicates a 3.2% annual risk of stroke. ?The patient's score is based upon: ?CHF History: 1 ?HTN History: 0 ?Diabetes History: 0 ?Stroke History: 0 ?Vascular Disease History: 0 ?Age Score: 2 ?Gender Score: 0 ?  ? ? ?    ? ?Physical Exam:   ? ?VS:  There were no vitals taken for this visit.   ? ?Wt Readings from Last 3 Encounters:  ?08/16/21 159 lb 13.3 oz (72.5 kg)  ?04/02/20 145 lb 12.8 oz (66.1 kg)  ?03/10/20 144 lb (65.3 kg)  ?  ? ?GEN:  Well nourished, well developed in no acute distress ?HEENT: Diffuse facial bruising resolving ?NECK: No JVD; No carotid bruits ?LYMPHATICS: No lymphadenopathy ?CARDIAC: irregular rate and rhythm, SEM RUSB, rubs, gallops ?RESPIRATORY:  Clear to auscultation without rales, wheezing or rhonchi  ?ABDOMEN: Soft, non-tender, non-distended ?MUSCULOSKELETAL:  No edema; No deformity  ?SKIN: Warm and dry ?NEUROLOGIC:  Alert and oriented x 3 ?PSYCHIATRIC:  Normal affect  ? ?ASSESSMENT:   ? ?Severe AS: He has heavy calcified valve with very high gradient. He was also symptomatic. We discussed with him and his family that he is high risk with signs of dementia for TAVR in the hospital. His daughter was here today and would like to explore the risks and benefits in detail with the structural team. It is challenging to know what he is taking which is a concern. I also stated that he would likely be placed on DAPT post TAVR and re-iterated bleeding risk if he fell again. The patient initially was hesitant but changed his mind and wanted to discuss with the structural team as well.  ? ?Afib: rates high today. Increased his metoprolol XL. 75 to 100. We decided [facility] was too high risk with recent fall. Continue off AC. Therefore will opt for rate control strategy. ? ?PLAN:   ? ?In order of problems listed  above: ? ?Increase metoprolol to 100 mg XL  ?Referral for structural team  ? ?   ? ?   ? ? ?Medication Adjustments/Labs and Tests Ordered: ?Current medicines are reviewed at length with the patient today.  Concerns regarding medicines are outlined above.  ?No orders of the defined types were placed in this encounter. ? ?No orders of the defined types were placed in this encounter. ? ? ?There are no Patient Instructions on file for this visit.  ? ?Signed, ?Janina Mayo, MD  ?09/09/2021  7:58 AM    ?[facility] ?

## 2021-09-13 ENCOUNTER — Ambulatory Visit: Payer: Medicare Other

## 2021-09-26 ENCOUNTER — Encounter: Payer: Self-pay | Admitting: Cardiovascular Disease

## 2021-09-26 ENCOUNTER — Ambulatory Visit: Payer: Medicare Other | Admitting: Cardiovascular Disease

## 2021-09-26 VITALS — BP 120/80 | HR 94 | Ht 70.5 in | Wt 160.6 lb

## 2021-09-26 DIAGNOSIS — Z01810 Encounter for preprocedural cardiovascular examination: Secondary | ICD-10-CM | POA: Diagnosis not present

## 2021-09-26 DIAGNOSIS — I350 Nonrheumatic aortic (valve) stenosis: Secondary | ICD-10-CM

## 2021-09-26 NOTE — H&P (View-Only) (Signed)
Cardiology Office Note:    Date:  09/26/2021   ID:  Guy Hutchinson, DOB 04/21/1936, MRN [id number]  PCP:  Guy Agreste, MD   [name] Cardiologist:  Janina Mayo, MD     Referring MD: Guy Agreste, MD   Chief Complaint  Patient presents with   Shortness of Breath    History of Present Illness:    Guy Hutchinson is a 86 y.o. male referred by Dr. Harl Hutchinson for evaluation of aortic stenosis.  The patient is here with his son today.  His daughter is conferenced in over the telephone.  It is unclear how long he has been noted to have a heart murmur.  He really had not been followed by cardiology until her recent hospitalization that occurred after mechanical fall.  The patient fell July 31, 2021 but then developed progressive shortness of breath for several days ultimately leading to his hospitalization.  He was diagnosed with new atrial fibrillation.  An echocardiogram was ultimately performed and showed findings consistent with severe aortic stenosis and moderate aortic regurgitation.  The patient presents today to discuss treatment options for his aortic stenosis.  He has not had any more falls since discharge from the hospital.  He ambulates with a walker and uses a wheelchair to go long distances.  He is primarily limited by shortness of breath with activity.  He denies orthopnea or PND.  He admits to chronic leg swelling.  He denies chest pain or pressure.  He does have lightheadedness at times.  Patient has not been felt to be a candidate for anticoagulation because of fall risk.  Past Medical History:  Diagnosis Date   Cataract    Dizziness 04/11/2017   Glaucoma    Prostate cancer [facility])     Past Surgical History:  Procedure Laterality Date   PROSTATE SURGERY  2007   Impant    Current Medications: Current Meds  Medication Sig   Ascorbic Acid (VITAMIN C) 500 MG CAPS Take 1 capsule by mouth daily.   aspirin EC 81 MG EC tablet Take 1 tablet (81 mg  total) by mouth daily. Swallow whole.   Bilberry 100 MG CAPS Take 1 capsule by mouth daily.   dorzolamide-timolol (COSOPT) 22.3-6.8 MG/ML ophthalmic solution Place 1 drop into both eyes 2 (two) times daily.   furosemide (LASIX) 40 MG tablet Take 1 tablet (40 mg total) by mouth daily.   GARLIC PO Take 1 capsule by mouth daily.   Homeopathic Products (ARNICA EX) Apply 1 application. topically in the morning and at bedtime.   latanoprost (XALATAN) 0.005 % ophthalmic solution Place 1 drop into both eyes at bedtime.   MELATONIN MAXIMUM STRENGTH 5 MG TABS Take 5 mg by mouth at bedtime.   metoprolol succinate (TOPROL-XL) 100 MG 24 hr tablet Take 1 tablet (100 mg total) by mouth daily.   MILK THISTLE PO Take 1 capsule by mouth every morning.   Misc Natural Products (GLUCOSAMINE CHOND DOUBLE STR) CAPS Take 1 capsule by mouth daily.   Multiple Vitamin (MULTIVITAMIN WITH MINERALS) TABS Take 1 tablet by mouth every other day.   OLIVE LEAF PO Take 400 mg by mouth daily.   OVER THE COUNTER MEDICATION Take 1 capsule by mouth every morning. Cats Claw   Turmeric 400 MG CAPS Take 1 capsule by mouth daily.   UNABLE TO FIND Take 1 capsule by mouth daily. Med Name: paudarco   UNABLE TO FIND Take 1 capsule by mouth daily. Med  Name: elderberry     Allergies:   Patient has no known allergies.   Social History   Socioeconomic History   Marital status: Divorced    Spouse name: Not on file   Number of children: 2   Years of education: 16   Highest education level: Bachelor's degree (e.g., BA, AB, BS)  Occupational History   Occupation: Retired  Tobacco Use   Smoking status: Never   Smokeless tobacco: Never  Vaping Use   Vaping Use: Never used  Substance and Sexual Activity   Alcohol use: Yes    Alcohol/week: 14.0 standard drinks    Types: 14 Cans of beer per week   Drug use: No   Sexual activity: Yes  Other Topics Concern   Not on file  Social History Narrative   Marital status; Divorced.  Dating  same male x 2000.      Children:      Lives:  Alone in 2019.      Tobacco: none; in 2019; quit in 1978.      Alcohol:  Drinks 2-3 miller lights per day.        Exercise:  Plays golf two days per week; walks around in malls.      ADLs; drives; performs ADLs; no assistant devices.      Advanced Directives:  None in 2019.  HCPOA:  Son/[employer].  FULL CODE; no prolonged measures.     Right handed    Social Determinants of Health   Financial Resource Strain: Not on file  Food Insecurity: Not on file  Transportation Needs: Not on file  Physical Activity: Not on file  Stress: Not on file  Social Connections: Not on file     Family History: The patient's family history includes Diabetes in his mother; Heart disease in his brother.  ROS:   Please see the history of present illness.    Positive for hard of hearing. All other systems reviewed and are negative.  EKGs/Labs/Other Studies Reviewed:    The following studies were reviewed today: Echo 08/12/2021: FINDINGS   Left Ventricle: Left ventricular ejection fraction, by estimation, is 60  to 65%. The left ventricle has normal function. The left ventricle  demonstrates regional wall motion abnormalities. The left ventricular  internal cavity size was normal in size.  There is moderate left ventricular hypertrophy. Left ventricular diastolic  parameters are indeterminate.   Right Ventricle: The right ventricular size is normal. No increase in  right ventricular wall thickness. Right ventricular systolic function is  normal. Tricuspid regurgitation signal is inadequate for assessing PA  pressure.   Left Atrium: Left atrial size was mildly dilated.   Right Atrium: Right atrial size was normal in size.   Pericardium: There is no evidence of pericardial effusion.   Mitral Valve: The mitral valve is normal in structure. There is moderate  calcification of the mitral valve leaflet(s). Mild mitral annular  calcification.  Trivial mitral valve regurgitation. No evidence of mitral  valve stenosis.   Tricuspid Valve: The tricuspid valve is normal in structure. Tricuspid  valve regurgitation is not demonstrated.   Aortic Valve: The aortic valve is tricuspid. There is severe calcifcation  of the aortic valve. Aortic valve regurgitation is moderate. Aortic  regurgitation PHT measures 441 msec. Severe aortic stenosis is present.  Aortic valve mean gradient measures 58.0   mmHg. Aortic valve peak gradient measures 57.0 mmHg. Aortic valve area,  by VTI measures 1.51 cm.   Pulmonic Valve: The pulmonic  valve was normal in structure. Pulmonic valve  regurgitation is mild.   Aorta: The aortic root is normal in size and structure.   Venous: The inferior vena cava is dilated in size with greater than 50%  respiratory variability, suggesting right atrial pressure of 8 mmHg.   IAS/Shunts: No atrial level shunt detected by color flow Doppler.      LEFT VENTRICLE  PLAX 2D  LVIDd:         3.90 cm  LVIDs:         3.50 cm  LV PW:         1.60 cm  LV IVS:        1.70 cm  LVOT diam:     2.50 cm  LV SV:         143  LV SV Index:   74  LVOT Area:     4.91 cm     LV Volumes (MOD)  LV vol d, MOD A2C: 131.0 ml  LV vol d, MOD A4C: 142.0 ml  LV vol s, MOD A2C: 62.2 ml  LV vol s, MOD A4C: 94.8 ml  LV SV MOD A2C:     68.8 ml  LV SV MOD A4C:     142.0 ml  LV SV MOD BP:      57.9 ml   RIGHT VENTRICLE            IVC  RV S prime:     8.68 cm/s  IVC diam: 2.40 cm  TAPSE (M-mode): 1.4 cm   LEFT ATRIUM           Index        RIGHT ATRIUM           Index  LA diam:      3.70 cm 1.91 cm/m   RA Area:     14.00 cm  LA Vol (A2C): 23.9 ml 12.35 ml/m  RA Volume:   25.50 ml  13.18 ml/m  LA Vol (A4C): 45.8 ml 23.67 ml/m   AORTIC VALVE                     PULMONIC VALVE  AV Area (Vmax):    2.35 cm      PR End Diast Vel: 2.21 msec  AV Area (Vmean):   2.30 cm  AV Area (VTI):     1.51 cm  AV Vmax:           377.60 cm/s   AV Vmean:          260.400 cm/s  AV VTI:            0.952 m  AV Peak Grad:      57.0 mmHg  AV Mean Grad:      58.0 mmHg  LVOT Vmax:         181.00 cm/s  LVOT Vmean:        122.000 cm/s  LVOT VTI:          0.292 m  LVOT/AV VTI ratio: 0.31  AI PHT:            441 msec     AORTA  Ao Root diam: 3.40 cm  Ao Asc diam:  3.20 cm   MITRAL VALVE  MV Area (PHT): 4.80 cm  SHUNTS  MV Decel Time: 158 msec  Systemic VTI:  0.29 m  Systemic Diam: 2.50 cm   EKG:  EKG is not ordered today.    Recent Labs: 08/11/2021: B Natriuretic Peptide 1,049.1; TSH 1.022 08/13/2021: ALT 21 08/14/2021: Hemoglobin 9.5; Platelets 276 08/15/2021: BUN 35; Creatinine, Ser 1.31; Magnesium 2.3; Potassium 3.6; Sodium 127  Recent Lipid Panel    Component Value Date/Time   CHOL 176 08/12/2021 0107   CHOL 194 01/15/2020 1529   TRIG 88 08/12/2021 0107   HDL 26 (L) 08/12/2021 0107   HDL 49 01/15/2020 1529   CHOLHDL 6.8 08/12/2021 0107   VLDL 18 08/12/2021 0107   LDLCALC 132 (H) 08/12/2021 0107   LDLCALC 125 (H) 01/15/2020 1529     Risk Assessment/Calculations:    CHA2DS2-VASc Score = 3   This indicates a 3.2% annual risk of stroke. The patient's score is based upon: CHF History: 1 HTN History: 0 Diabetes History: 0 Stroke History: 0 Vascular Disease History: 0 Age Score: 2 Gender Score: 0          Physical Exam:    VS:  BP 120/80   Pulse 94   Ht 5' 10.5" (1.791 m)   Wt 160 lb 9.6 oz (72.8 kg)   SpO2 98%   BMI 22.72 kg/m     Wt Readings from Last 3 Encounters:  09/26/21 160 lb 9.6 oz (72.8 kg)  09/09/21 154 lb 6.4 oz (70 kg)  08/16/21 159 lb 13.3 oz (72.5 kg)     GEN:  elderly male in no acute distress HEENT: Normal NECK: No JVD; No carotid bruits LYMPHATICS: No lymphadenopathy CARDIAC: irregularly irregular, 2/6 harsh systolic murmur at the RUSB, crescendo decrescendo murmur RESPIRATORY:  Clear to auscultation without rales, wheezing or rhonchi  ABDOMEN: Soft,  non-tender, non-distended MUSCULOSKELETAL:  2+ pretibial edema bilaterally with chronic stasis changes, No deformity  SKIN: Warm and dry NEUROLOGIC:  Alert and oriented x 3 PSYCHIATRIC:  Normal affect   ASSESSMENT:    1. Pre-procedural cardiovascular examination   2. Nonrheumatic aortic valve stenosis    PLAN:    In order of problems listed above:  The patient appears to have severe, stage D1, symptomatic aortic stenosis and moderate aortic valve insufficiency.  I have personally reviewed his echo study which shows preserved LV systolic function, severe calcification and restriction of the aortic valve leaflets, and a mean transvalvular gradient of about 40 mmHg.  I think the mean transaortic gradient and the echo report is a typo and I will review this with the imaging team.  Regardless of that, the patient's aortic stenosis does appear to be severe and certainly mixed in with his degree of aortic insufficiency I would suspect him to have symptoms attributable to this.  He describes New York Heart Association functional class III symptoms of exertional dyspnea.  I suspect that atrial fibrillation also contributes.  Today, we discussed the natural history of aortic stenosis and potential treatment options in light of the patient's comorbid medical conditions.  We reviewed options of palliative medical therapy versus TAVR.  The patient would not be considered a candidate for conventional surgery due to his age, comorbidities, and reduced functional capacity.  I think it would be reasonable to pursue further evaluation with a right and left heart catheterization to better define the patient's hemodynamics, reassess the degree of aortic stenosis, and evaluate coronary anatomy. I have reviewed the risks, indications, and alternatives to cardiac catheterization, possible angioplasty, and stenting with the patient. Risks include but are not limited to bleeding, infection, vascular injury, stroke, myocardial  infection, arrhythmia, kidney injury, radiation-related injury in the case of prolonged fluoroscopy use, emergency cardiac surgery, and death. The patient understands the risks of serious complication is 1-2 in 5053 with diagnostic cardiac cath and 1-2% or less with angioplasty/stenting.  Pending the results of his catheterization, he would then undergo a gated CTA of the heart and a CTA of the chest, abdomen, and pelvis.  He understands that this would assess for feasibility of TAVR as well as anatomic suitability for transfemoral access.  Once the studies are completed, his case will be reviewed with our multidisciplinary heart valve team and he will be referred to see Dr. Cyndia Bent for formal cardiac surgical consultation as part of a multidisciplinary approach to his care.  We specifically discussed the patient's atrial fibrillation and lack of anticoagulation as an additional risk factor for stroke, whether or not the patient undergoes TAVR.      Shared Decision Making/Informed Consent The risks [stroke (1 in 1000), death (1 in 1000), kidney failure [usually temporary] (1 in 500), bleeding (1 in 200), allergic reaction [possibly serious] (1 in 200)], benefits (diagnostic support and management of coronary artery disease) and alternatives of a cardiac catheterization were discussed in detail with Mr. Ciszewski and he is willing to proceed.    Medication Adjustments/Labs and Tests Ordered: Current medicines are reviewed at length with the patient today.  Concerns regarding medicines are outlined above.  Orders Placed This Encounter  Procedures   Basic metabolic panel   CBC   No orders of the defined types were placed in this encounter.   Patient Instructions  Medication Instructions:  Your physician recommends that you continue on your current medications as directed. Please refer to the Current Medication list given to you today.  *If you need a refill on your cardiac medications before your  next appointment, please call your pharmacy*   Lab Work: CBC, BMET on Wednesday 10/12/21 If you have labs (blood work) drawn today and your tests are completely normal, you will receive your results only by: [place] (if you have MyChart) OR A paper copy in the mail If you have any lab test that is abnormal or we need to change your treatment, we will call you to review the results.   Testing/Procedures: R & L heart Cath (TAVR eval) Your physician has requested that you have a cardiac catheterization. Cardiac catheterization is used to diagnose and/or treat various heart conditions. Doctors may recommend this procedure for a number of different reasons. The most common reason is to evaluate chest pain. Chest pain can be a symptom of coronary artery disease (CAD), and cardiac catheterization can show whether plaque is narrowing or blocking your heart's arteries. This procedure is also used to evaluate the valves, as well as measure the blood flow and oxygen levels in different parts of your heart. For further information please visit [email or web address]. Please follow instruction sheet, as given.    Follow-Up: Structural Team will follow-up  We recommend signing up for the patient portal called "MyChart".  Sign up information is provided on this After Visit Summary.  MyChart is used to connect with patients for Virtual Visits (Telemedicine).  Patients are able to view lab/test results, encounter notes, upcoming appointments, etc.  Non-urgent messages can be sent to your provider as well.   To learn more about what you can do with MyChart, go to [email or web address].    Provider:   Sherren Mocha, MD   Other Instructions  [place]  [place] OFFICE [address], [address] [place] [address] Dept: [phone] Loc: [place]  09/26/2021  You are scheduled for a Cardiac  Catheterization on Monday, May 22 with Dr. Sherren Mocha.  1. Please arrive at the Main Entrance A at [facility]: [place],  [address] at 8:30 AM (This time is two hours before your procedure to ensure your preparation). Free valet parking service is available.   Special note: Every effort is made to have your procedure done on time. Please understand that emergencies sometimes delay scheduled procedures.  2. Diet: Do not eat solid foods after midnight.  You may have clear liquids until 5 AM upon the day of the procedure.  3. Labs: You will need to have blood drawn on Wednesday, May 17 at [facility]. [address]  Open: 7:30am - 5pm    Phone: [phone]. You do not need to be fasting.  4. Medication instructions in preparation for your procedure:   Contrast Allergy: No  Do NOT take Furosemide (Lasix) morning of procedure DO NOT take NSAIDS morning of procedure   On the morning of your procedure, take Aspirin and any morning medicines NOT listed above.  You may use sips of water.  5. Plan to go home the same day, you will only stay overnight if medically necessary. 6. You MUST have a responsible adult to drive you home. 7. An adult MUST be with you the first 24 hours after you arrive home. 8. Bring a current list of your medications, and the last time and date medication taken. 9. Bring ID and current insurance cards. 10.Please wear clothes that are easy to get on and off and wear slip-on shoes.  Thank you for allowing [place] to care for you!   -- [place] [facility]   Important Information About Sugar         Signed, Sherren Mocha, MD  09/26/2021 5:51 PM    [place]

## 2021-09-26 NOTE — Patient Instructions (Signed)
Medication Instructions:  ?Your physician recommends that you continue on your current medications as directed. Please refer to the Current Medication list given to you today. ? ?*If you need a refill on your cardiac medications before your next appointment, please call your pharmacy* ? ? ?Lab Work: ?CBC, BMET on Wednesday 10/12/21 ?If you have labs (blood work) drawn today and your tests are completely normal, you will receive your results only by: ?MyChart Message (if you have MyChart) OR ?A paper copy in the mail ?If you have any lab test that is abnormal or we need to change your treatment, we will call you to review the results. ? ? ?Testing/Procedures: ?R & L heart Cath (TAVR eval) ?Your physician has requested that you have a cardiac catheterization. Cardiac catheterization is used to diagnose and/or treat various heart conditions. Doctors may recommend this procedure for a number of different reasons. The most common reason is to evaluate chest pain. Chest pain can be a symptom of coronary artery disease (CAD), and cardiac catheterization can show whether plaque is narrowing or blocking your heart?s arteries. This procedure is also used to evaluate the valves, as well as measure the blood flow and oxygen levels in different parts of your heart. For further information please visit [email or web address]. Please follow instruction sheet, as given. ? ? ? ?Follow-Up: ?Structural Team will follow-up ? ?We recommend signing up for the patient portal called "MyChart".  Sign up information is provided on this After Visit Summary.  MyChart is used to connect with patients for Virtual Visits (Telemedicine).  Patients are able to view lab/test results, encounter notes, upcoming appointments, etc.  Non-urgent messages can be sent to your provider as well.   ?To learn more about what you can do with MyChart, go to [email or web address].   ? ?Provider:   ?Sherren Mocha, MD ? ? ?Other Instructions ? ?[place] ?[place] OFFICE ?[address], [address] ?[place] [address] ?Dept: [phone] ?Loc: [phone] ? ?Guy Hutchinson  09/26/2021 ? ?You are scheduled for a Cardiac Catheterization on Monday, May 22 with Dr. Sherren Mocha. ? ?1. Please arrive at the Main Entrance A at [facility]: [place], [place] [address] at 8:30 AM (This time is two hours before your procedure to ensure your preparation). Free valet parking service is available.  ? ?Special note: Every effort is made to have your procedure done on time. Please understand that emergencies sometimes delay scheduled procedures. ? ?2. Diet: Do not eat solid foods after midnight.  You may have clear liquids until 5 AM upon the day of the procedure. ? ?3. Labs: You will need to have blood drawn on Wednesday, May 17 at [facility] at [facility]. [address], [place]  ?Open: 7:30am - 5pm    Phone: [phone]. You do not need to be fasting. ? ?4. Medication instructions in preparation for your procedure: ? ? Contrast Allergy: No ? ?Do NOT take Furosemide (Lasix) morning of procedure ?DO NOT take NSAIDS morning of procedure ? ? ?On the morning of your procedure, take Aspirin and any morning medicines NOT listed above.  You may use sips of water. ? ?5. Plan to go home the same day, you will only stay overnight if medically necessary. ?6. You MUST have a responsible adult to drive you home. ?7. An adult MUST be with you the first 24 hours after you arrive home. ?8. Bring a  current list of your medications, and the last time and date medication taken. ?9. Bring ID and current insurance cards. ?10.Please wear clothes that are easy to get on and off and wear slip-on shoes. ? ?Thank you for allowing [place] to care for you! ?  -- [place] [facility] ? ? ?Important Information About Sugar ? ? ? ? ?  ?

## 2021-09-26 NOTE — Progress Notes (Signed)
Pre Surgical Assessment: 5 M Walk Test ? ?70M=16.58f ? ?5 Meter Walk Test- trial 1: 8.64 seconds ?5 Meter Walk Test- trial 2: 10.87 seconds ?5 Meter Walk Test- trial 3: 9.31 seconds ?5 Meter Walk Test Average: 9.61 seconds ?  ?

## 2021-09-26 NOTE — Progress Notes (Signed)
?Cardiology Office Note:   ? ?Date:  09/26/2021  ? ?ID:  Guy Hutchinson, DOB 08/12/1935, MRN [id number] ? ?PCP:  Wendie Agreste, MD ?  ?[facility] Prov[facility] ?Cardiologist:  Janina Mayo, MD    ? ?Referring MD: Wendie Agreste, MD  ? ?Chief Complaint  ?Patient presents with  ? Shortness of Breath  ? ? ?History of Present Illness:   ? ?Guy Hutchinson is a 86 y.o. male referred by Dr. Harl Bowie for evaluation of aortic stenosis. ? ?The patient is here with his son today.  His daughter is conferenced in over the telephone.  It is unclear how long he has been noted to have a heart murmur.  He really had not been followed by cardiology until her recent hospitalization that occurred after mechanical fall.  The patient fell July 31, 2021 but then developed progressive shortness of breath for several days ultimately leading to his hospitalization.  He was diagnosed with new atrial fibrillation.  An echocardiogram was ultimately performed and showed findings consistent with severe aortic stenosis and moderate aortic regurgitation.  The patient presents today to discuss treatment options for his aortic stenosis. ? ?He has not had any more falls since discharge from the hospital.  He ambulates with a walker and uses a wheelchair to go long distances.  He is primarily limited by shortness of breath with activity.  He denies orthopnea or PND.  He admits to chronic leg swelling.  He denies chest pain or pressure.  He does have lightheadedness at times.  Patient has not been felt to be a candidate for anticoagulation because of fall risk. ? ?Past Medical History:  ?Diagnosis Date  ? Cataract   ? Dizziness 04/11/2017  ? Glaucoma   ? Prostate cancer ([place])   ? ? ?Past Surgical History:  ?Procedure Laterality Date  ? PROSTATE SURGERY  2007  ? Impant  ? ? ?Current Medications: ?Current Meds  ?Medication Sig  ? Ascorbic Acid (VITAMIN C) 500 MG CAPS Take 1 capsule by mouth daily.  ? aspirin EC 81 MG EC tablet Take 1 tablet (81 mg  total) by mouth daily. Swallow whole.  ? Bilberry 100 MG CAPS Take 1 capsule by mouth daily.  ? dorzolamide-timolol (COSOPT) 22.3-6.8 MG/ML ophthalmic solution Place 1 drop into both eyes 2 (two) times daily.  ? furosemide (LASIX) 40 MG tablet Take 1 tablet (40 mg total) by mouth daily.  ? GARLIC PO Take 1 capsule by mouth daily.  ? Homeopathic Products (ARNICA EX) Apply 1 application. topically in the morning and at bedtime.  ? latanoprost (XALATAN) 0.005 % ophthalmic solution Place 1 drop into both eyes at bedtime.  ? MELATONIN MAXIMUM STRENGTH 5 MG TABS Take 5 mg by mouth at bedtime.  ? metoprolol succinate (TOPROL-XL) 100 MG 24 hr tablet Take 1 tablet (100 mg total) by mouth daily.  ? MILK THISTLE PO Take 1 capsule by mouth every morning.  ? Misc Natural Products (GLUCOSAMINE CHOND DOUBLE STR) CAPS Take 1 capsule by mouth daily.  ? Multiple Vitamin (MULTIVITAMIN WITH MINERALS) TABS Take 1 tablet by mouth every other day.  ? OLIVE LEAF PO Take 400 mg by mouth daily.  ? OVER THE COUNTER MEDICATION Take 1 capsule by mouth every morning. Cats Claw  ? Turmeric 400 MG CAPS Take 1 capsule by mouth daily.  ? UNABLE TO FIND Take 1 capsule by mouth daily. Med Name: paudarco  ? UNABLE TO FIND Take 1 capsule by mouth daily. Med  Name: elderberry  ?  ? ?Allergies:   Patient has no known allergies.  ? ?Social History  ? ?Socioeconomic History  ? Marital status: Divorced  ?  Spouse name: Not on file  ? Number of children: 2  ? Years of education: 50  ? Highest education level: Bachelor's degree (e.g., BA, AB, BS)  ?Occupational History  ? Occupation: Retired  ?Tobacco Use  ? Smoking status: Never  ? Smokeless tobacco: Never  ?Vaping Use  ? Vaping Use: Never used  ?Substance and Sexual Activity  ? Alcohol use: Yes  ?  Alcohol/week: 14.0 standard drinks  ?  Types: 14 Cans of beer per week  ? Drug use: No  ? Sexual activity: Yes  ?Other Topics Concern  ? Not on file  ?Social History Narrative  ? Marital status; Divorced.  Dating  same male x 2000.  ?    Children:  ?    Lives:  Alone in 2019.  ?    Tobacco: none; in 2019; quit in 1978.  ?    Alcohol:  Drinks 2-3 miller lights per day.    ?    Exercise:  Plays golf two days per week; walks [place]  ?    ADLs; drives; performs ADLs; no assistant devices.  ?    Advanced Directives:  None in 2019.  HCPOA:  [employer].  FULL CODE; no prolonged measures.    ? Right handed   ? ?Social Determinants of Health  ? ?Financial Resource Strain: Not on file  ?Food Insecurity: Not on file  ?Transportation Needs: Not on file  ?Physical Activity: Not on file  ?Stress: Not on file  ?Social Connections: Not on file  ?  ? ?Family History: ?The patient's family history includes Diabetes in his mother; Heart disease in his brother. ? ?ROS:   ?Please see the history of present illness.    ?Positive for hard of hearing. All other systems reviewed and are negative. ? ?EKGs/Labs/Other Studies Reviewed:   ? ?The following studies were reviewed today: ?Echo 08/12/2021: ?FINDINGS  ? Left Ventricle: Left ventricular ejection fraction, by estimation, is 60  ?to 65%. The left ventricle has normal function. The left ventricle  ?demonstrates regional wall motion abnormalities. The left ventricular  ?internal cavity size was normal in size.  ?There is moderate left ventricular hypertrophy. Left ventricular diastolic  ?parameters are indeterminate.  ? ?Right Ventricle: The right ventricular size is normal. No increase in  ?right ventricular wall thickness. Right ventricular systolic function is  ?normal. Tricuspid regurgitation signal is inadequate for assessing PA  ?pressure.  ? ?Left Atrium: Left atrial size was mildly dilated.  ? ?Right Atrium: Right atrial size was normal in size.  ? ?Pericardium: There is no evidence of pericardial effusion.  ? ?Mitral Valve: The mitral valve is normal in structure. There is moderate  ?calcification of the mitral valve leaflet(s). Mild mitral annular  ?calcification.  Trivial mitral valve regurgitation. No evidence of mitral  ?valve stenosis.  ? ?Tricuspid Valve: The tricuspid valve is normal in structure. Tricuspid  ?valve regurgitation is not demonstrated.  ? ?Aortic Valve: The aortic valve is tricuspid. There is severe calcifcation  ?of the aortic valve. Aortic valve regurgitation is moderate. Aortic  ?regurgitation PHT measures 441 msec. Severe aortic stenosis is present.  ?Aortic valve mean gradient measures 58.0  ? mmHg. Aortic valve peak gradient measures 57.0 mmHg. Aortic valve area,  ?by VTI measures 1.51 cm?.  ? ?Pulmonic Valve: The pulmonic  valve was normal in structure. Pulmonic valve  ?regurgitation is mild.  ? ?Aorta: The aortic root is normal in size and structure.  ? ?Venous: The inferior vena cava is dilated in size with greater than 50%  ?respiratory variability, suggesting right atrial pressure of 8 mmHg.  ? ?IAS/Shunts: No atrial level shunt detected by color flow Doppler.  ? ?   ?LEFT VENTRICLE  ?PLAX 2D  ?LVIDd:         3.90 cm  ?LVIDs:         3.50 cm  ?LV PW:         1.60 cm  ?LV IVS:        1.70 cm  ?LVOT diam:     2.50 cm  ?LV SV:         143  ?LV SV Index:   74  ?LVOT Area:     4.91 cm?  ?   ?LV Volumes (MOD)  ?LV vol d, MOD A2C: 131.0 ml  ?LV vol d, MOD A4C: 142.0 ml  ?LV vol s, MOD A2C: 62.2 ml  ?LV vol s, MOD A4C: 94.8 ml  ?LV SV MOD A2C:     68.8 ml  ?LV SV MOD A4C:     142.0 ml  ?LV SV MOD BP:      57.9 ml  ? ?RIGHT VENTRICLE            IVC  ?RV S prime:     8.68 cm/s  IVC diam: 2.40 cm  ?TAPSE (M-mode): 1.4 cm  ? ?LEFT ATRIUM           Index        RIGHT ATRIUM           Index  ?LA diam:      3.70 cm 1.91 cm/m?   RA Area:     14.00 cm?  ?LA Vol (A2C): 23.9 ml 12.35 ml/m?  RA Volume:   25.50 ml  13.18 ml/m?  ?LA Vol (A4C): 45.8 ml 23.67 ml/m?  ? AORTIC VALVE                     PULMONIC VALVE  ?AV Area (Vmax):    2.35 cm?      PR End Diast Vel: 2.21 msec  ?AV Area (Vmean):   2.30 cm?  ?AV Area (VTI):     1.51 cm?  ?AV Vmax:           377.60 cm/s   ?AV Vmean:          260.400 cm/s  ?AV VTI:            0.952 m  ?AV Peak Grad:      57.0 mmHg  ?AV Mean Grad:      58.0 mmHg  ?LVOT Vmax:         181.00 cm/s  ?LVOT Vmean:        122.000 cm/s  ?LVOT VTI:

## 2021-09-29 ENCOUNTER — Ambulatory Visit (INDEPENDENT_AMBULATORY_CARE_PROVIDER_SITE_OTHER): Payer: Medicare Other

## 2021-09-29 DIAGNOSIS — Z0000 Encounter for general adult medical examination without abnormal findings: Secondary | ICD-10-CM | POA: Diagnosis not present

## 2021-09-29 NOTE — Patient Instructions (Signed)
Mr. Flight , ?Thank you for taking time to come for your Medicare Wellness Visit. I appreciate your ongoing commitment to your health goals. Please review the following plan we discussed and let me know if I can assist you in the future.  ? ?Screening recommendations/referrals: ?Colonoscopy: no longer required  ?Recommended yearly ophthalmology/optometry visit for glaucoma screening and checkup ?Recommended yearly dental visit for hygiene and checkup ? ?Vaccinations: ?Influenza vaccine: completed  ?Pneumococcal vaccine: due  ?Tdap vaccine: 05/27/2022 ?Shingles vaccine: will consider  ? ?Advanced directives: yes ? ?Conditions/risks identified: none  ? ?Next appointment: none  ? ?Preventive Care 66 Years and Older, Male ?Preventive care refers to lifestyle choices and visits with your health care provider that can promote health and wellness. ?What does preventive care include? ?A yearly physical exam. This is also called an annual well check. ?Dental exams once or twice a year. ?Routine eye exams. Ask your health care provider how often you should have your eyes checked. ?Personal lifestyle choices, including: ?Daily care of your teeth and gums. ?Regular physical activity. ?Eating a healthy diet. ?Avoiding tobacco and drug use. ?Limiting alcohol use. ?Practicing safe sex. ?Taking low doses of aspirin every day. ?Taking vitamin and mineral supplements as recommended by your health care provider. ?What happens during an annual well check? ?The services and screenings done by your health care provider during your annual well check will depend on your age, overall health, lifestyle risk factors, and family history of disease. ?Counseling  ?Your health care provider may ask you questions about your: ?Alcohol use. ?Tobacco use. ?Drug use. ?Emotional well-being. ?Home and relationship well-being. ?Sexual activity. ?Eating habits. ?History of falls. ?Memory and ability to understand (cognition). ?Work and work  Statistician. ?Screening  ?You may have the following tests or measurements: ?Height, weight, and BMI. ?Blood pressure. ?Lipid and cholesterol levels. These may be checked every 5 years, or more frequently if you are over 68 years old. ?Skin check. ?Lung cancer screening. You may have this screening every year starting at age 69 if you have a 30-pack-year history of smoking and currently smoke or have quit within the past 15 years. ?Fecal occult blood test (FOBT) of the stool. You may have this test every year starting at age 79. ?Flexible sigmoidoscopy or colonoscopy. You may have a sigmoidoscopy every 5 years or a colonoscopy every 10 years starting at age 69. ?Prostate cancer screening. Recommendations will vary depending on your family history and other risks. ?Hepatitis C blood test. ?Hepatitis B blood test. ?Sexually transmitted disease (STD) testing. ?Diabetes screening. This is done by checking your blood sugar (glucose) after you have not eaten for a while (fasting). You may have this done every 1-3 years. ?Abdominal aortic aneurysm (AAA) screening. You may need this if you are a current or former smoker. ?Osteoporosis. You may be screened starting at age 25 if you are at high risk. ?Talk with your health care provider about your test results, treatment options, and if necessary, the need for more tests. ?Vaccines  ?Your health care provider may recommend certain vaccines, such as: ?Influenza vaccine. This is recommended every year. ?Tetanus, diphtheria, and acellular pertussis (Tdap, Td) vaccine. You may need a Td booster every 10 years. ?Zoster vaccine. You may need this after age 30. ?Pneumococcal 13-valent conjugate (PCV13) vaccine. One dose is recommended after age 58. ?Pneumococcal polysaccharide (PPSV23) vaccine. One dose is recommended after age 14. ?Talk to your health care provider about which screenings and vaccines you need and how often you  need them. ?This information is not intended to replace  advice given to you by your health care provider. Make sure you discuss any questions you have with your health care provider. ?Document Released: 06/11/2015 Document Revised: 02/02/2016 Document Reviewed: 03/16/2015 ?Elsevier Interactive Patient Education ? 2017 [place]. ? ?Fall Prevention in the Home ?Falls can cause injuries. They can happen to people of all ages. There are many things you can do to make your home safe and to help prevent falls. ?What can I do on the outside of my home? ?Regularly fix the edges of walkways and driveways and fix any cracks. ?Remove anything that might make you trip as you walk through a door, such as a raised step or threshold. ?Trim any bushes or trees on the path to your home. ?Use bright outdoor lighting. ?Clear any walking paths of anything that might make someone trip, such as rocks or tools. ?Regularly check to see if handrails are loose or broken. Make sure that both sides of any steps have handrails. ?Any raised decks and porches should have guardrails on the edges. ?Have any leaves, snow, or ice cleared regularly. ?Use sand or salt on walking paths during winter. ?Clean up any spills in your garage right away. This includes oil or grease spills. ?What can I do in the bathroom? ?Use night lights. ?Install grab bars by the toilet and in the tub and shower. Do not use towel bars as grab bars. ?Use non-skid mats or decals in the tub or shower. ?If you need to sit down in the shower, use a plastic, non-slip stool. ?Keep the floor dry. Clean up any water that spills on the floor as soon as it happens. ?Remove soap buildup in the tub or shower regularly. ?Attach bath mats securely with double-sided non-slip rug tape. ?Do not have throw rugs and other things on the floor that can make you trip. ?What can I do in the bedroom? ?Use night lights. ?Make sure that you have a light by your bed that is easy to reach. ?Do not use any sheets or blankets that are too big for your bed.  They should not hang down onto the floor. ?Have a firm chair that has side arms. You can use this for support while you get dressed. ?Do not have throw rugs and other things on the floor that can make you trip. ?What can I do in the kitchen? ?Clean up any spills right away. ?Avoid walking on wet floors. ?Keep items that you use a lot in easy-to-reach places. ?If you need to reach something above you, use a strong step stool that has a grab bar. ?Keep electrical cords out of the way. ?Do not use floor polish or wax that makes floors slippery. If you must use wax, use non-skid floor wax. ?Do not have throw rugs and other things on the floor that can make you trip. ?What can I do with my stairs? ?Do not leave any items on the stairs. ?Make sure that there are handrails on both sides of the stairs and use them. Fix handrails that are broken or loose. Make sure that handrails are as long as the stairways. ?Check any carpeting to make sure that it is firmly attached to the stairs. Fix any carpet that is loose or worn. ?Avoid having throw rugs at the top or bottom of the stairs. If you do have throw rugs, attach them to the floor with carpet tape. ?Make sure that you have a light switch  at the top of the stairs and the bottom of the stairs. If you do not have them, ask someone to add them for you. ?What else can I do to help prevent falls? ?Wear shoes that: ?Do not have high heels. ?Have rubber bottoms. ?Are comfortable and fit you well. ?Are closed at the toe. Do not wear sandals. ?If you use a stepladder: ?Make sure that it is fully opened. Do not climb a closed stepladder. ?Make sure that both sides of the stepladder are locked into place. ?Ask someone to hold it for you, if possible. ?Clearly mark and make sure that you can see: ?Any grab bars or handrails. ?First and last steps. ?Where the edge of each step is. ?Use tools that help you move around (mobility aids) if they are needed. These  include: ?Canes. ?Walkers. ?Scooters. ?Crutches. ?Turn on the lights when you go into a dark area. Replace any light bulbs as soon as they burn out. ?Set up your furniture so you have a clear path. Avoid moving your furniture around. ?If an

## 2021-09-29 NOTE — Progress Notes (Signed)
? ?Subjective:  ? Guy Hutchinson is a 86 y.o. male who presents for an Subsequent  Medicare Annual Wellness Visit. ? ? ?I connected with Guy Hutchinson today by telephone and verified that I am speaking with the correct person using two identifiers. ?Location patient: home ?Location provider: work ?Persons participating in the virtual visit: patient, provider. ?  ?I discussed the limitations, risks, security and privacy concerns of performing an evaluation and management service by telephone and the availability of in person appointments. I also discussed with the patient that there may be a patient responsible charge related to this service. The patient expressed understanding and verbally consented to this telephonic visit.  ?  ?Interactive audio and video telecommunications were attempted between this provider and patient, however failed, due to patient having technical difficulties OR patient did not have access to video capability.  We continued and completed visit with audio only. ? ?  ?Review of Systems    ? ?Cardiac Risk Factors include: advanced age (>33mn, >>53women) ? ?   ?Objective:  ?  ?Today's Vitals  ? ?There is no height or weight on file to calculate BMI. ? ? ?  09/29/2021  ? 11:14 AM 08/11/2021  ?  9:00 PM 07/31/2021  ?  7:56 PM 03/26/2019  ? 10:16 AM 04/09/2017  ? 11:41 AM  ?Advanced Directives  ?Does Patient Have a Medical Advance Directive? Yes Yes No No Yes  ?Type of [place] Healthcare Power of [place] will  ?Does patient want to make changes to medical advance directive?  No - Patient declined     ?[place] of [place]? No - copy requested    No - copy requested  ?Would patient like information on creating a medical advance directive?  No - Guardian declined No - Patient declined Yes (Inpatient - patient defers creating a medical advance directive at this time - Information given)    ? ? ?Current Medications (verified) ?Outpatient Encounter Medications as of 09/29/2021  ?Medication Sig  ? Ascorbic Acid (VITAMIN C) 500 MG CAPS Take 1 capsule by mouth daily.  ? aspirin EC 81 MG EC tablet Take 1 tablet (81 mg total) by mouth daily. Swallow whole.  ? Bilberry 100 MG CAPS Take 1 capsule by mouth daily.  ? dorzolamide-timolol (COSOPT) 22.3-6.8 MG/ML ophthalmic solution Place 1 drop into both eyes 2 (two) times daily.  ? furosemide (LASIX) 40 MG tablet Take 1 tablet (40 mg total) by mouth daily.  ? GARLIC PO Take 1 capsule by mouth daily.  ? Homeopathic Products (ARNICA EX) Apply 1 application. topically in the morning and at bedtime.  ? latanoprost (XALATAN) 0.005 % ophthalmic solution Place 1 drop into both eyes at bedtime.  ? MELATONIN MAXIMUM STRENGTH 5 MG TABS Take 5 mg by mouth at bedtime.  ? metoprolol succinate (TOPROL-XL) 100 MG 24 hr tablet Take 1 tablet (100 mg total) by mouth daily.  ? MILK THISTLE PO Take 1 capsule by mouth every morning.  ? Misc Natural Products (GLUCOSAMINE CHOND DOUBLE STR) CAPS Take 1 capsule by mouth daily.  ? Multiple Vitamin (MULTIVITAMIN WITH MINERALS) TABS Take 1 tablet by mouth every other day.  ? OLIVE LEAF PO Take 400 mg by mouth daily.  ? OVER THE COUNTER MEDICATION Take 1 capsule by mouth every morning. Cats Claw  ? Turmeric 400 MG CAPS Take 1 capsule by mouth daily.  ? UNABLE TO  FIND Take 1 capsule by mouth daily. Med Name: paudarco  ? UNABLE TO FIND Take 1 capsule by mouth daily. Med Name: elderberry  ? ?No facility-administered encounter medications on file as of 09/29/2021.  ? ? ?Allergies (verified) ?Patient has no known allergies.  ? ?History: ?Past Medical History:  ?Diagnosis Date  ? Cataract   ? Dizziness 04/11/2017  ? Glaucoma   ? Prostate cancer ([place])   ? ?Past Surgical History:  ?Procedure Laterality Date  ? PROSTATE SURGERY  2007  ? Impant  ? ?Family History  ?Problem Relation Age of Onset  ? Diabetes Mother   ? Heart disease Brother   ? ?Social  History  ? ?Socioeconomic History  ? Marital status: Divorced  ?  Spouse name: Not on file  ? Number of children: 2  ? Years of education: 14  ? Highest education level: Bachelor's degree (e.g., BA, AB, BS)  ?Occupational History  ? Occupation: Retired  ?Tobacco Use  ? Smoking status: Never  ? Smokeless tobacco: Never  ?Vaping Use  ? Vaping Use: Never used  ?Substance and Sexual Activity  ? Alcohol use: Yes  ?  Alcohol/week: 14.0 standard drinks  ?  Types: 14 Cans of beer per week  ? Drug use: No  ? Sexual activity: Yes  ?Other Topics Concern  ? Not on file  ?Social History Narrative  ? Marital status; Divorced.  Dating same male x 2000.  ?    Children:  ?    Lives:  Alone in 2019.  ?    Tobacco: none; in 2019; quit in 1978.  ?    Alcohol:  Drinks 2-3 miller lights per day.    ?    Exercise:  Plays golf two days per week; walks around in malls.  ?    ADLs; drives; performs ADLs; no assistant devices.  ?    Advanced Directives:  None in 2019.  HCPOA:  Son/Guy Hutchinson.  FULL CODE; no prolonged measures.    ? Right handed   ? ?Social Determinants of Health  ? ?Financial Resource Strain: Low Risk   ? Difficulty of Paying Living Expenses: Not hard at all  ?Food Insecurity: No Food Insecurity  ? Worried About Charity fundraiser in the Last Year: Never true  ? Ran Out of Food in the Last Year: Never true  ?Transportation Needs: No Transportation Needs  ? Lack of Transportation (Medical): No  ? Lack of Transportation (Non-Medical): No  ?Physical Activity: Insufficiently Active  ? Days of Exercise per Week: 3 days  ? Minutes of Exercise per Session: 30 min  ?Stress: No Stress Concern Present  ? Feeling of Stress : Not at all  ?Social Connections: Moderately Isolated  ? Frequency of Communication with Friends and Family: Three times a week  ? Frequency of Social Gatherings with Friends and Family: Three times a week  ? Attends Religious Services: More than 4 times per year  ? Active Member of Clubs or Organizations:  No  ? Attends Archivist Meetings: Never  ? Marital Status: Divorced  ? ? ?Tobacco Counseling ?Counseling given: Not Answered ? ? ?Clinical Intake: ? ?Pre-visit preparation completed: Yes ? ?Pain : No/denies pain ? ?  ? ?Nutritional Risks: None ?Diabetes: No ? ?How often do you need to have someone help you when you read instructions, pamphlets, or other written materials from your doctor or pharmacy?: 1 - Never ?What is the last grade level you completed in school?: BA ? ?  Diabetic?no  ? ?Interpreter Needed?: No ? ?Information entered by :: [email or web address] ? ? ?Activities of Daily Living ? ?  09/29/2021  ? 11:17 AM 08/11/2021  ? 11:00 PM  ?In your present state of health, do you have any difficulty performing the following activities:  ?Hearing? 0 1  ?Vision? 0 0  ?Difficulty concentrating or making decisions? 0 1  ?Walking or climbing stairs? 0 1  ?Dressing or bathing? 0 1  ?Doing errands, shopping? 0 1  ?Preparing Food and eating ? N   ?Using the Toilet? N   ?In the past six months, have you accidently leaked urine? N   ?Do you have problems with loss of bowel control? N   ?Managing your Medications? N   ?Managing your Finances? N   ?Housekeeping or managing your Housekeeping? N   ? ? ?Patient Care Team: ?Wendie Agreste, MD as PCP - General (Family Medicine) ?Janina Mayo, MD as PCP - Cardiology (Cardiology) ?Melissa Noon, [place] as Referring Physician (Optometry) ? ?Indicate any recent Medical Services you may have received from other than [facility] providers in the past year (date may be approximate). ? ?   ?Assessment:  ? This is a routine wellness examination for [place]. ? ?Hearing/Vision screen ?Vision Screening - Comments:: Annual eye exams  ? ?Dietary issues and exercise activities discussed: ?Current Exercise Habits: Home exercise routine, Type of exercise: strength training/weights, Time (Minutes): 30, Frequency (Times/Week): 3, Weekly Exercise (Minutes/Week): 90, Intensity: Mild, Exercise limited by: None  identified ? ? Goals Addressed   ? ?  ?  ?  ?  ? This Visit's Progress  ?  Increase water intake   On track  ?  Patient states that he wants to try to increase his water intake daily.  ? ?  ? ?  ? ?Dep

## 2021-10-03 ENCOUNTER — Telehealth: Payer: Self-pay | Admitting: Internal Medicine

## 2021-10-03 NOTE — Telephone Encounter (Signed)
Called patient , advised of message from MD.  ?Patient son verbalized understanding,. ? ?

## 2021-10-03 NOTE — Telephone Encounter (Signed)
Contacted patient, he states when they seen Dr.Cooper they had discussed about having a dentist- son believed he was suppose to see a dentist prior to his CATH. I reviewed chart and advised that I had not heard to see a dentist prior to the CATH, but if he was a candidate for a valve replacement and this was completed, they would do abx before dental procedures- they may have discussed this with patient and son at appointment as TAVR was mentioned. Son was just confused. I advised I would route to MD to make sure I was not missing anything prior to upcoming CATH.  ? ?Thanks!  ?

## 2021-10-03 NOTE — Telephone Encounter (Signed)
Pt's son states that he was told that pt may need to see a Dentist before having scheduled procedure done. Son would like nurse to give him a call regarding this. Please advise ?

## 2021-10-07 ENCOUNTER — Telehealth: Payer: Self-pay | Admitting: Family Medicine

## 2021-10-07 NOTE — Telephone Encounter (Signed)
Called back and provided verbal orders  

## 2021-10-07 NOTE — Telephone Encounter (Signed)
Kiki  called in asking for verbal orders to apply Zincoxide 2x daily to the bottom area.  ? ?Please call Kiki at [phone]  ?

## 2021-10-12 ENCOUNTER — Other Ambulatory Visit: Payer: Medicare Other

## 2021-10-12 DIAGNOSIS — Z01810 Encounter for preprocedural cardiovascular examination: Secondary | ICD-10-CM

## 2021-10-12 LAB — BASIC METABOLIC PANEL WITH GFR
BUN/Creatinine Ratio: 17 (ref 10–24)
BUN: 21 mg/dL (ref 8–27)
CO2: 23 mmol/L (ref 20–29)
Calcium: 9.2 mg/dL (ref 8.6–10.2)
Chloride: 97 mmol/L (ref 96–106)
Creatinine, Ser: 1.22 mg/dL (ref 0.76–1.27)
Glucose: 98 mg/dL (ref 70–99)
Potassium: 4.1 mmol/L (ref 3.5–5.2)
Sodium: 133 mmol/L — ABNORMAL LOW (ref 134–144)
eGFR: 59 mL/min/1.73 — ABNORMAL LOW (ref >59)

## 2021-10-12 LAB — CBC
Hematocrit: 34 % — ABNORMAL LOW (ref 37.5–51.0)
Hemoglobin: 11.3 g/dL — ABNORMAL LOW (ref 13.0–17.7)
MCH: 30.2 pg (ref 26.6–33.0)
MCHC: 33.2 g/dL (ref 31.5–35.7)
MCV: 91 fL (ref 79–97)
Platelets: 227 x10E3/uL (ref 150–450)
RBC: 3.74 x10E6/uL — ABNORMAL LOW (ref 4.14–5.80)
RDW: 15.6 % — ABNORMAL HIGH (ref 11.6–15.4)
WBC: 8.8 x10E3/uL (ref 3.4–10.8)

## 2021-10-13 ENCOUNTER — Other Ambulatory Visit: Payer: Self-pay | Admitting: Physician Assistant

## 2021-10-13 ENCOUNTER — Encounter: Payer: Self-pay | Admitting: Physician Assistant

## 2021-10-13 ENCOUNTER — Telehealth: Payer: Self-pay | Admitting: *Deleted

## 2021-10-13 DIAGNOSIS — I350 Nonrheumatic aortic (valve) stenosis: Secondary | ICD-10-CM

## 2021-10-13 NOTE — Telephone Encounter (Signed)
Cardiac Catheterization scheduled at [facility] for: Monday Oct 17, 2021 10:30 AM Arrival time and place: [place] Entrance A at: 8:30 AM   Nothing to eat after midnight prior to procedure, clear liquids until 5 AM day of procedure.  Medication instructions: -Hold:  Lasix-AM of procedure  -Except hold medications usual morning medications can be taken with sips of water including aspirin 81 mg.  Confirmed patient has responsible adult to drive home post procedure and be with patient first 24 hours after arriving home.  Patient reports no new symptoms concerning for COVID-19/no exposure to COVID-19 in the past 10 days.  Reviewed procedure instructions with patient's son (DPR).

## 2021-10-17 ENCOUNTER — Encounter (HOSPITAL_COMMUNITY)
Admission: RE | Disposition: A | Discharge status: Home / Self Care | Payer: Self-pay | Source: Home / Self Care | Attending: Cardiovascular Disease

## 2021-10-17 ENCOUNTER — Other Ambulatory Visit: Payer: Self-pay

## 2021-10-17 ENCOUNTER — Encounter (HOSPITAL_COMMUNITY): Payer: Self-pay | Admitting: Cardiovascular Disease

## 2021-10-17 ENCOUNTER — Ambulatory Visit (HOSPITAL_COMMUNITY)
Admission: RE | Admit: 2021-10-17 | Discharge: 2021-10-17 | Disposition: A | Discharge status: Home / Self Care | Payer: Medicare Other | Attending: Cardiovascular Disease | Admitting: Cardiovascular Disease

## 2021-10-17 DIAGNOSIS — I350 Nonrheumatic aortic (valve) stenosis: Secondary | ICD-10-CM | POA: Diagnosis present

## 2021-10-17 DIAGNOSIS — I351 Nonrheumatic aortic (valve) insufficiency: Secondary | ICD-10-CM | POA: Diagnosis not present

## 2021-10-17 DIAGNOSIS — I4891 Unspecified atrial fibrillation: Secondary | ICD-10-CM | POA: Diagnosis not present

## 2021-10-17 DIAGNOSIS — I2584 Coronary atherosclerosis due to calcified coronary lesion: Secondary | ICD-10-CM | POA: Insufficient documentation

## 2021-10-17 DIAGNOSIS — I2510 Atherosclerotic heart disease of native coronary artery without angina pectoris: Secondary | ICD-10-CM | POA: Diagnosis not present

## 2021-10-17 HISTORY — PX: RIGHT/LEFT HEART CATH AND CORONARY ANGIOGRAPHY: CATH118266

## 2021-10-17 LAB — POCT I-STAT 7, (LYTES, BLD GAS, ICA,H+H)
Acid-base deficit: 2 mmol/L (ref 0.0–2.0)
Bicarbonate: 21.7 mmol/L (ref 20.0–28.0)
Calcium, Ion: 1.15 mmol/L (ref 1.15–1.40)
HCT: 32 % — ABNORMAL LOW (ref 39.0–52.0)
Hemoglobin: 10.9 g/dL — ABNORMAL LOW (ref 13.0–17.0)
O2 Saturation: 99 %
Potassium: 3.4 mmol/L — ABNORMAL LOW (ref 3.5–5.1)
Sodium: 137 mmol/L (ref 135–145)
TCO2: 23 mmol/L (ref 22–32)
pCO2 arterial: 32.2 mmHg (ref 32–48)
pH, Arterial: 7.437 (ref 7.35–7.45)
pO2, Arterial: 131 mmHg — ABNORMAL HIGH (ref 83–108)

## 2021-10-17 LAB — POCT I-STAT EG7
Acid-Base Excess: 0 mmol/L (ref 0.0–2.0)
Bicarbonate: 23.8 mmol/L (ref 20.0–28.0)
Calcium, Ion: 1.18 mmol/L (ref 1.15–1.40)
HCT: 32 % — ABNORMAL LOW (ref 39.0–52.0)
Hemoglobin: 10.9 g/dL — ABNORMAL LOW (ref 13.0–17.0)
O2 Saturation: 80 %
Potassium: 3.5 mmol/L (ref 3.5–5.1)
Sodium: 137 mmol/L (ref 135–145)
TCO2: 25 mmol/L (ref 22–32)
pCO2, Ven: 35.5 mmHg — ABNORMAL LOW (ref 44–60)
pH, Ven: 7.434 — ABNORMAL HIGH (ref 7.25–7.43)
pO2, Ven: 42 mmHg (ref 32–45)

## 2021-10-17 SURGERY — RIGHT/LEFT HEART CATH AND CORONARY ANGIOGRAPHY
Anesthesia: LOCAL

## 2021-10-17 MED ORDER — HEPARIN (PORCINE) IN NACL 2-0.9 UNITS/ML
INTRAMUSCULAR | Status: DC | PRN
  Administered 2021-10-17: 10 mL via INTRA_ARTERIAL

## 2021-10-17 MED ORDER — SODIUM CHLORIDE 0.9 % IV SOLN: 250.0000 mL | INTRAVENOUS | Status: DC | PRN

## 2021-10-17 MED ORDER — MIDAZOLAM HCL 2 MG/2ML IJ SOLN
INTRAMUSCULAR | Status: DC | PRN
  Administered 2021-10-17: 1 mg via INTRAVENOUS

## 2021-10-17 MED ORDER — SODIUM CHLORIDE 0.9% FLUSH: 3.0000 mL | INTRAVENOUS | Status: DC | PRN

## 2021-10-17 MED ORDER — SODIUM CHLORIDE 0.9 % WEIGHT BASED INFUSION: 1.0000 mL/kg/h | INTRAVENOUS | Status: DC

## 2021-10-17 MED ORDER — IOHEXOL 350 MG/ML SOLN
INTRAVENOUS | Status: DC | PRN
  Administered 2021-10-17: 70 mL via INTRA_ARTERIAL

## 2021-10-17 MED ORDER — SODIUM CHLORIDE 0.9% FLUSH: 3.0000 mL | Freq: Two times a day (BID) | INTRAVENOUS | Status: DC

## 2021-10-17 MED ORDER — HYDRALAZINE HCL 20 MG/ML IJ SOLN: 10.0000 mg | INTRAMUSCULAR | Status: DC | PRN

## 2021-10-17 MED ORDER — MIDAZOLAM HCL 2 MG/2ML IJ SOLN
INTRAMUSCULAR | Status: AC
  Filled 2021-10-17: qty 2

## 2021-10-17 MED ORDER — LIDOCAINE HCL (PF) 1 % IJ SOLN
INTRAMUSCULAR | Status: DC | PRN
  Administered 2021-10-17 (×2): 2 mL via INTRADERMAL

## 2021-10-17 MED ORDER — LIDOCAINE HCL (PF) 1 % IJ SOLN
INTRAMUSCULAR | Status: AC
  Filled 2021-10-17: qty 30

## 2021-10-17 MED ORDER — ONDANSETRON HCL 4 MG/2ML IJ SOLN: 4.0000 mg | Freq: Four times a day (QID) | INTRAMUSCULAR | Status: DC | PRN

## 2021-10-17 MED ORDER — HEPARIN (PORCINE) IN NACL 1000-0.9 UT/500ML-% IV SOLN
INTRAVENOUS | Status: AC
  Filled 2021-10-17: qty 1000

## 2021-10-17 MED ORDER — VERAPAMIL HCL 2.5 MG/ML IV SOLN
INTRAVENOUS | Status: AC
  Filled 2021-10-17: qty 2

## 2021-10-17 MED ORDER — SODIUM CHLORIDE 0.9 % WEIGHT BASED INFUSION
3.0000 mL/kg/h | INTRAVENOUS | Status: AC
  Administered 2021-10-17: 3 mL/kg/h via INTRAVENOUS

## 2021-10-17 MED ORDER — LABETALOL HCL 5 MG/ML IV SOLN: 10.0000 mg | INTRAVENOUS | Status: DC | PRN

## 2021-10-17 MED ORDER — HEPARIN (PORCINE) IN NACL 1000-0.9 UT/500ML-% IV SOLN
INTRAVENOUS | Status: DC | PRN
  Administered 2021-10-17 (×2): 500 mL

## 2021-10-17 MED ORDER — ASPIRIN 81 MG PO CHEW: 81.0000 mg | CHEWABLE_TABLET | ORAL | Status: DC

## 2021-10-17 MED ORDER — SODIUM CHLORIDE 0.9 % IV SOLN: INTRAVENOUS | Status: DC

## 2021-10-17 MED ORDER — ACETAMINOPHEN 325 MG PO TABS: 650.0000 mg | ORAL_TABLET | ORAL | Status: DC | PRN

## 2021-10-17 MED ORDER — HEPARIN SODIUM (PORCINE) 1000 UNIT/ML IJ SOLN
INTRAMUSCULAR | Status: DC | PRN
  Administered 2021-10-17: 4000 [IU] via INTRAVENOUS

## 2021-10-17 SURGICAL SUPPLY — 15 items
BAND CMPR LRG ZPHR (HEMOSTASIS) ×1
BAND ZEPHYR COMPRESS 30 LONG (HEMOSTASIS) ×1 IMPLANT
CATH 5FR JL3.5 JR4 ANG PIG MP (CATHETERS) ×1 IMPLANT
CATH BALLN WEDGE 5F 110CM (CATHETERS) ×1 IMPLANT
CATH INFINITI 5FR AL1 (CATHETERS) ×1 IMPLANT
GLIDESHEATH SLEND SS 6F .021 (SHEATH) ×1 IMPLANT
GUIDEWIRE INQWIRE 1.5J.035X260 (WIRE) IMPLANT
INQWIRE 1.5J .035X260CM (WIRE) ×2
KIT HEART LEFT (KITS) ×3 IMPLANT
PACK CARDIAC CATHETERIZATION (CUSTOM PROCEDURE TRAY) ×3 IMPLANT
SHEATH GLIDE SLENDER 4/5FR (SHEATH) ×1 IMPLANT
SHEATH PROBE COVER 6X72 (BAG) ×1 IMPLANT
TRANSDUCER W/STOPCOCK (MISCELLANEOUS) ×3 IMPLANT
TUBING CIL FLEX 10 FLL-RA (TUBING) ×3 IMPLANT
WIRE EMERALD ST .035X150CM (WIRE) ×1 IMPLANT

## 2021-10-17 NOTE — Interval H&P Note (Signed)
History and Physical Interval Note:  10/17/2021 10:37 AM  Linward Headland  has presented today for surgery, with the diagnosis of aortic stenosis.  The various methods of treatment have been discussed with the patient and family. After consideration of risks, benefits and other options for treatment, the patient has consented to  Procedure(s): RIGHT/LEFT HEART CATH AND CORONARY ANGIOGRAPHY (N/A) as a surgical intervention.  The patient's history has been reviewed, patient examined, no change in status, stable for surgery.  I have reviewed the patient's chart and labs.  Questions were answered to the patient's satisfaction.     Sherren Mocha

## 2021-10-21 NOTE — Progress Notes (Signed)
  [facility]   MULTIDISCIPLINARY HEART VALVE TEAM  The structural team reviewed pt's cath on 10/18/21 and his coronary anatomy is not favorable for TAVR.  Dr Burt Knack said he already spoke with the pt and his family in regards to a palliative approach (continued follow up and medical therapy as the pt becomes symptomatic, when symptoms are approaching EOL from AS then refer to palliative).  The pt is scheduled to see Dr Phineas Inches in July for follow-up.

## 2021-10-26 ENCOUNTER — Ambulatory Visit: Payer: Medicare Other | Admitting: Physician Assistant

## 2021-10-26 ENCOUNTER — Ambulatory Visit (INDEPENDENT_AMBULATORY_CARE_PROVIDER_SITE_OTHER): Payer: Medicare Other

## 2021-10-26 ENCOUNTER — Encounter: Payer: Self-pay | Admitting: Physician Assistant

## 2021-10-26 ENCOUNTER — Inpatient Hospital Stay (HOSPITAL_COMMUNITY): Admission: RE | Admit: 2021-10-26 | Payer: Medicare Other | Source: Ambulatory Visit

## 2021-10-26 VITALS — BP 106/66 | HR 104 | Ht 70.5 in | Wt 167.0 lb

## 2021-10-26 DIAGNOSIS — I480 Paroxysmal atrial fibrillation: Secondary | ICD-10-CM

## 2021-10-26 DIAGNOSIS — E7849 Other hyperlipidemia: Secondary | ICD-10-CM | POA: Diagnosis not present

## 2021-10-26 DIAGNOSIS — I350 Nonrheumatic aortic (valve) stenosis: Secondary | ICD-10-CM

## 2021-10-26 DIAGNOSIS — I5033 Acute on chronic diastolic (congestive) heart failure: Secondary | ICD-10-CM | POA: Diagnosis not present

## 2021-10-26 DIAGNOSIS — I2510 Atherosclerotic heart disease of native coronary artery without angina pectoris: Secondary | ICD-10-CM | POA: Diagnosis not present

## 2021-10-26 MED ORDER — POTASSIUM CHLORIDE CRYS ER 20 MEQ PO TBCR: EXTENDED_RELEASE_TABLET | ORAL | 3 refills | Status: AC

## 2021-10-26 MED ORDER — FUROSEMIDE 40 MG PO TABS: ORAL_TABLET | ORAL | Status: AC

## 2021-10-26 NOTE — Progress Notes (Unsigned)
[id number] zio xt monitor from office inventory applied to patient.  Dr. Phineas Inches to read.

## 2021-10-26 NOTE — Patient Instructions (Signed)
Medication Instructions:  Your physician has recommended you make the following change in your medication:   START: Furosemide '40mg'$  twice daily for 4 days then take '40mg'$  daily START: Potassium 15mq twice daily for 4 days then 248m daily  *If you need a refill on your cardiac medications before your next appointment, please call your pharmacy*   Lab Work: TODAY: BMET, BNP  If you have labs (blood work) drawn today and your tests are completely normal, you will receive your results only by: [place] have [place]Chart) OR A paper copy in the mail If you have any lab test that is abnormal or we need to change your treatment, we will call you to review the results.   Follow-Up: [facility].  As part of our continuing mission to provide you with exceptional heart care, we have created designated Provider Care Teams.  These Care Teams include your primary Cardiologist (physician) and Advanced Practice Providers (APPs -  Physician Assistants and Nurse Practitioners) who all work together to provide you with the care you need, when you need it.  We recommend signing up for the patient portal called "MyChart".  Sign up information is provided on this After Visit Summary.  MyChart is used to connect with patients for Virtual Visits (Telemedicine).  Patients are able to view lab/test results, encounter notes, upcoming appointments, etc.  Non-urgent messages can be sent to your provider as well.   To learn more about what you can do with MyChart, go to [email or web address]   Your next appointment:   As scheduled  Other Instructions  ZIO XT- Long Term Monitor Instructions  Your physician has requested you wear a ZIO patch monitor for 14 days.  This is a single patch monitor. [employer] supplies one patch monitor per enrollment. Additional stickers are not available. Please do not apply patch if you will be having a Nuclear Stress  Test,  Echocardiogram, Cardiac CT, MRI, or Chest Xray during the period you would be wearing the  monitor. The patch cannot be worn during these tests. You cannot remove and re-apply the  ZIO XT patch monitor.  Your ZIO patch monitor will be mailed 3 day USPS to your address on file. It may take 3-5 days  to receive your monitor after you have been enrolled.  Once you have received your monitor, please review the enclosed instructions. Your monitor  has already been registered assigning a specific monitor serial # to you.  Billing and Patient Assistance Program Information  We have supplied [employer] with any of your insurance information on file for billing purposes. [employer] offers a sliding scale Patient Assistance Program for patients that do not have  insurance, or whose insurance does not completely cover the cost of the ZIO monitor.  You must apply for the Patient Assistance Program to qualify for this discounted rate.  To apply, please call [employer] at [phone] option 4, select option 2, ask to apply for  Patient Assistance Program. [name] ask your household income, and how many people  are in your household. They will quote your out-of-pocket cost based on that information.  [employer] will also be able to set up a 1229-monthnterest-free payment plan if needed.  Applying the monitor   Shave hair from upper left chest.  Hold abrader disc by orange tab. Rub abrader in 40 strokes over the upper left chest as  indicated in your monitor instructions.  Clean  area with 4 enclosed alcohol pads. Let dry.  Apply patch as indicated in monitor instructions. Patch will be placed under collarbone on left  side of chest with arrow pointing upward.  Rub patch adhesive wings for 2 minutes. Remove white label marked "1". Remove the white  label marked "2". Rub patch adhesive wings for 2 additional minutes.  While looking in a mirror, press and release button in center of patch. A  small green light will  flash 3-4 times. This will be your only indicator that the monitor has been turned on.  Do not shower for the first 24 hours. You may shower after the first 24 hours.  Press the button if you feel a symptom. You will hear a small click. Record Date, Time and  Symptom in the Patient Logbook.  When you are ready to remove the patch, follow instructions on the last 2 pages of Patient  Logbook. Stick patch monitor onto the last page of Patient Logbook.  Place Patient Logbook in the blue and white box. Use locking tab on box and tape box closed  securely. The blue and white box has prepaid postage on it. Please place it in the mailbox as  soon as possible. Your physician should have your test results approximately 7 days after the  monitor has been mailed back to [facility].  Call [place] at [phone] if you have questions regarding  your ZIO XT patch monitor. Call them immediately if you see an orange light blinking on your  monitor.  If your monitor falls off in less than 4 days, contact our Monitor department at [phone].  If your monitor becomes loose or falls off after 4 days call [employer] at [phone] for  suggestions on securing your monitor  Two Gram Sodium Diet 2000 mg  What is Sodium? Sodium is a mineral found naturally in many foods. The most significant source of sodium in the diet is table salt, which is about 40% sodium.  Processed, convenience, and preserved foods also contain a large amount of sodium.  The body needs only 500 mg of sodium daily to function,  A normal diet provides more than enough sodium even if you do not use salt.  Why Limit Sodium? A build up of sodium in the body can cause thirst, increased blood pressure, shortness of breath, and water retention.  Decreasing sodium in the diet can reduce edema and risk of heart attack or stroke associated with high blood pressure.  Keep in mind that there are many  other factors involved in these health problems.  Heredity, obesity, lack of exercise, cigarette smoking, stress and what you eat all play a role.  General Guidelines: Do not add salt at the table or in cooking.  One teaspoon of salt contains over 2 grams of sodium. Read food labels Avoid processed and convenience foods Ask your dietitian before eating any foods not dicussed in the menu planning guidelines Consult your physician if you wish to use a salt substitute or a sodium containing medication such as antacids.  Limit milk and milk products to 16 oz (2 cups) per day.  Shopping Hints: READ LABELS!! "Dietetic" does not necessarily mean low sodium. Salt and other sodium ingredients are often added to foods during processing.    Menu Planning Guidelines Food Group Choose More Often Avoid  Beverages (see also the milk group All fruit juices, low-sodium, salt-free vegetables juices, low-sodium carbonated beverages Regular vegetable or tomato juices, commercially softened water used  for drinking or cooking  Breads and Cereals Enriched white, wheat, rye and pumpernickel bread, hard rolls and dinner rolls; muffins, cornbread and waffles; most dry cereals, cooked cereal without added salt; unsalted crackers and breadsticks; low sodium or homemade bread crumbs Bread, rolls and crackers with salted tops; quick breads; instant hot cereals; pancakes; commercial bread stuffing; self-rising flower and biscuit mixes; regular bread crumbs or cracker crumbs  Desserts and Sweets Desserts and sweets mad with mild should be within allowance Instant pudding mixes and cake mixes  Fats Butter or margarine; vegetable oils; unsalted salad dressings, regular salad dressings limited to 1 Tbs; light, sour and heavy cream Regular salad dressings containing bacon fat, bacon bits, and salt pork; snack dips made with instant soup mixes or processed cheese; salted nuts  Fruits Most fresh, frozen and canned fruits Fruits  processed with salt or sodium-containing ingredient (some dried fruits are processed with sodium sulfites        Vegetables Fresh, frozen vegetables and low- sodium canned vegetables Regular canned vegetables, sauerkraut, pickled vegetables, and others prepared in brine; frozen vegetables in sauces; vegetables seasoned with ham, bacon or salt pork  Condiments, Sauces, Miscellaneous  Salt substitute with physician's approval; pepper, herbs, spices; vinegar, lemon or lime juice; hot pepper sauce; garlic powder, onion powder, low sodium soy sauce (1 Tbs.); low sodium condiments (ketchup, chili sauce, mustard) in limited amounts (1 tsp.) fresh ground horseradish; unsalted tortilla chips, pretzels, potato chips, popcorn, salsa (1/4 cup) Any seasoning made with salt including garlic salt, celery salt, onion salt, and seasoned salt; sea salt, rock salt, kosher salt; meat tenderizers; monosodium glutamate; mustard, regular soy sauce, barbecue, sauce, chili sauce, teriyaki sauce, steak sauce, Worcestershire sauce, and most flavored vinegars; canned gravy and mixes; regular condiments; salted snack foods, olives, picles, relish, horseradish sauce, catsup   Food preparation: Try these seasonings Meats:    Pork Sage, onion Serve with applesauce  Chicken Poultry seasoning, thyme, parsley Serve with cranberry sauce  Lamb Curry powder, rosemary, garlic, thyme Serve with mint sauce or jelly  Veal Marjoram, basil Serve with current jelly, cranberry sauce  Beef Pepper, bay leaf Serve with dry mustard, unsalted chive butter  Fish [place] leaf, dill Serve with unsalted lemon butter, unsalted parsley butter  Vegetables:    Asparagus Lemon juice   Broccoli Lemon juice   Carrots Mustard dressing parsley, mint, nutmeg, glazed with unsalted butter and sugar   Green beans Marjoram, lemon juice, nutmeg,dill seed   Tomatoes Basil, marjoram, onion   Spice /blend for [employer]" 4 tsp ground thyme 1 tsp ground sage 3 tsp  ground rosemary 4 tsp ground marjoram   Test your knowledge A product that says "Salt Free" may still contain sodium. True or False Garlic Powder and Hot Pepper Sauce an be used as alternative seasonings.True or False Processed foods have more sodium than fresh foods.  True or False Canned Vegetables have less sodium than froze True or False   WAYS TO DECREASE YOUR SODIUM INTAKE Avoid the use of added salt in cooking and at the table.  Table salt (and other prepared seasonings which contain salt) is probably one of the greatest sources of sodium in the diet.  Unsalted foods can gain flavor from the sweet, sour, and butter taste sensations of herbs and spices.  Instead of using salt for seasoning, try the following seasonings with the foods listed.  Remember: how you use them to enhance natural food flavors is limited only by your creativity... Allspice-Meat, fish,  eggs, fruit, peas, red and yellow vegetables Almond Extract-Fruit baked goods Anise Seed-Sweet breads, fruit, carrots, beets, cottage cheese, cookies (tastes like licorice) Basil-Meat, fish, eggs, vegetables, rice, vegetables salads, soups, sauces Bay Leaf-Meat, fish, stews, poultry Burnet-Salad, vegetables (cucumber-like flavor) Caraway Seed-Bread, cookies, cottage cheese, meat, vegetables, cheese, rice Cardamon-Baked goods, fruit, soups Celery Powder or seed-Salads, salad dressings, sauces, meatloaf, soup, bread.Do not use  celery salt Chervil-Meats, salads, fish, eggs, vegetables, cottage cheese (parsley-like flavor) Chili Power-Meatloaf, chicken cheese, corn, eggplant, egg dishes Chives-Salads cottage cheese, egg dishes, soups, vegetables, sauces Cilantro-Salsa, casseroles Cinnamon-Baked goods, fruit, pork, lamb, chicken, carrots Cloves-Fruit, baked goods, fish, pot roast, green beans, beets, carrots Coriander-Pastry, cookies, meat, salads, cheese (lemon-orange flavor) Cumin-Meatloaf, fish,cheese, eggs, cabbage,fruit pie  (caraway flavor) [employer], fruit, eggs, fish, poultry, cottage cheese, vegetables Dill Seed-Meat, cottage cheese, poultry, vegetables, fish, salads, bread Fennel Seed-Bread, cookies, apples, pork, eggs, fish, beets, cabbage, cheese, Licorice-like flavor Garlic-(buds or powder) Salads, meat, poultry, fish, bread, butter, vegetables, potatoes.Do not  use garlic salt Ginger-Fruit, vegetables, baked goods, meat, fish, poultry Horseradish Root-Meet, vegetables, butter Lemon Juice or Extract-Vegetables, fruit, tea, baked goods, fish salads Mace-Baked goods fruit, vegetables, fish, poultry (taste like nutmeg) Maple Extract-Syrups Marjoram-Meat, chicken, fish, vegetables, breads, green salads (taste like Sage) Mint-Tea, lamb, sherbet, vegetables, desserts, carrots, cabbage Mustard, Dry or Seed-Cheese, eggs, meats, vegetables, poultry Nutmeg-Baked goods, fruit, chicken, eggs, vegetables, desserts Onion Powder-Meat, fish, poultry, vegetables, cheese, eggs, bread, rice salads (Do not use   Onion salt) Orange Extract-Desserts, baked goods Oregano-Pasta, eggs, cheese, onions, pork, lamb, fish, chicken, vegetables, green salads Paprika-Meat, fish, poultry, eggs, cheese, vegetables Parsley Flakes-Butter, vegetables, meat fish, poultry, eggs, bread, salads (certain forms may   Contain sodium Pepper-Meat fish, poultry, vegetables, eggs Peppermint Extract-Desserts, baked goods Poppy Seed-Eggs, bread, cheese, fruit dressings, baked goods, noodles, vegetables, cottage  [employer], poultry, meat, fish, cauliflower, turnips,eggs bread Saffron-Rice, bread, veal, chicken, fish, eggs Sage-Meat, fish, poultry, onions, eggplant, tomateos, pork, stews Savory-Eggs, salads, poultry, meat, rice, vegetables, soups, pork Tarragon-Meat, poultry, fish, eggs, butter, vegetables (licorice-like flavor)  Thyme-Meat, poultry, fish, eggs, vegetables, (clover-like flavor),  sauces, soups Tumeric-Salads, butter, eggs, fish, rice, vegetables (saffron-like flavor) Vanilla Extract-Baked goods, candy Vinegar-Salads, vegetables, meat marinades Walnut Extract-baked goods, candy   2. Choose your Foods Wisely   The following is a list of foods to avoid which are high in sodium:  Meats-Avoid all smoked, canned, salt cured, dried and kosher meat and fish as well as Anchovies   [employer] meats:Bologna, Liverwurst, Pastrami Canned meat or fish  Marinated herring Caviar    Pepperoni Corned Beef   Pizza Dried chipped beef  Salami Frozen breaded fish or meat Salt pork Frankfurters or hot dogs  Sardines Gefilte fish   Sausage Ham (boiled ham, Proscuitto Smoked butt    spiced ham)   Spam      TV Dinners Vegetables Canned vegetables (Regular) Relish Canned mushrooms  Sauerkraut Olives    Tomato juice Pickles  Bakery and Dessert Products Canned puddings  Cream pies Cheesecake   Decorated cakes Cookies  Beverages/Juices Tomato juice, regular  Gatorade   V-8 vegetable juice, regular  Breads and Cereals Biscuit mixes   Salted potato chips, corn chips, pretzels Bread stuffing mixes  Salted crackers and rolls Pancake and waffle mixes Self-rising flour  Seasonings Accent    Meat sauces Barbecue sauce  Meat tenderizer Catsup    Monosodium glutamate (MSG) Celery salt   Onion salt Chili sauce   Prepared  mustard Garlic salt   Salt, seasoned salt, sea salt Gravy mixes   Soy sauce Horseradish   Steak sauce Ketchup   Tartar sauce Lite salt    Teriyaki sauce Marinade mixes   Worcestershire sauce  Others Baking powder   Cocoa and cocoa mixes Baking soda   Commercial casserole mixes Candy-caramels, chocolate  Dehydrated soups    Bars, fudge,nougats  Instant rice and pasta mixes Canned broth or soup  Maraschino cherries Cheese, aged and processed cheese and cheese spreads  Learning Assessment Quiz  Indicated T (for True) or F (for False) for  each of the following statements:  _____ Fresh fruits and vegetables and unprocessed grains are generally low in sodium _____ Water may contain a considerable amount of sodium, depending on the source _____ You can always tell if a food is high in sodium by tasting it _____ Certain laxatives my be high in sodium and should be avoided unless prescribed   by a physician or pharmacist _____ Salt substitutes may be used freely by anyone on a sodium restricted diet _____ Sodium is present in table salt, food additives and as a natural component of   most foods _____ Table salt is approximately 90% sodium _____ Limiting sodium intake may help prevent excess fluid accumulation in the body _____ On a sodium-restricted diet, seasonings such as bouillon soy sauce, and    cooking wine should be used in place of table salt _____ On an ingredient list, a product which lists monosodium glutamate as the first   ingredient is an appropriate food to include on a low sodium diet  Circle the best answer(s) to the following statements (Hint: there may be more than one correct answer)  11. On a low-sodium diet, some acceptable snack items are:    A. Olives  F. Bean dip   K. Grapefruit juice    B. Salted Pretzels G. Commercial Popcorn   L. Canned peaches    C. Carrot Sticks  H. Bouillon   M. Unsalted nuts   D. [place] fries  I. Peanut butter crackers N. Salami   E. Sweet pickles J. Tomato Juice   O. Pizza  12.  Seasonings that may be used freely on a reduced - sodium diet include   A. Lemon wedges F.Monosodium glutamate K. Celery seed    B.Soysauce   G. Pepper   L. Mustard powder   C. Sea salt  H. Cooking wine  M. Onion flakes   D. Vinegar  E. Prepared horseradish N. Salsa   E. Sage   J. Worcestershire sauce  O. Chutney

## 2021-10-26 NOTE — Progress Notes (Signed)
Cardiology Office Note    Date:  10/26/2021   ID:  Guy Hutchinson, DOB [birth year], MRN [id number]   PCP:  Wendie Agreste, MD   [place]  Cardiologist:  Janina Mayo, MD   Advanced Practice Provider:  No care team member to display Electrophysiologist:  None   [phone]   No chief complaint on file.   History of Present Illness:  Guy Hutchinson is a 86 y.o. male with history of HTN, Diastolic CHF,PAF-not anticoagulated due to risk of fall and severe bleeding, severe AS, Severe CAD on cath 10/13/21 not felt to be candidate for aortic valve repair or treatment of CAD.  Dr. Burt Knack spoke with pt and his family about palliative care approach.   Patient comes in with son and daughter. He's in [facility] assisted living. He has chronic DOE and legs swelling and weeping legs. Gets short of breath going to the bathroom or down the hall. HR 104 here.   Past Medical History:  Diagnosis Date   Cataract    Dizziness 04/11/2017   Glaucoma    Prostate cancer [facility])     Past Surgical History:  Procedure Laterality Date   PROSTATE SURGERY  2007   Impant   RIGHT/LEFT HEART CATH AND CORONARY ANGIOGRAPHY N/A 10/17/2021   Procedure: RIGHT/LEFT HEART CATH AND CORONARY ANGIOGRAPHY;  Surgeon: Sherren Mocha, MD;  Location: [facility];  Service: Cardiovascular;  Laterality: N/A;    Current Medications: Current Meds  Medication Sig   aspirin EC 81 MG EC tablet Take 1 tablet (81 mg total) by mouth daily. Swallow whole.   dorzolamide-timolol (COSOPT) 22.3-6.8 MG/ML ophthalmic solution Place 1 drop into both eyes 2 (two) times daily.   folic acid (FOLVITE) 701 MCG tablet Take 400 mcg by mouth daily.   Garlic 779 MG CAPS Take 500 mg by mouth daily.   latanoprost (XALATAN) 0.005 % ophthalmic solution Place 1 drop into both eyes at bedtime.   melatonin 5 MG TABS Take 5 mg by mouth at bedtime.   metoprolol succinate (TOPROL-XL) 25 MG 24 hr tablet Take 75 mg  by mouth daily.   Multiple Vitamin (MULTIVITAMIN WITH MINERALS) TABS Take 1 tablet by mouth daily.   potassium chloride SA (KLOR-CON M) 20 MEQ tablet Take 67mq twice daily for 4 days then take 224m daily   TURMERIC PO Take 720 mg by mouth daily.   vitamin C (ASCORBIC ACID) 500 MG tablet Take 500 mg by mouth daily.   [DISCONTINUED] furosemide (LASIX) 40 MG tablet Take 1 tablet (40 mg total) by mouth daily.     Allergies:   Patient has no known allergies.   Social History   Socioeconomic History   Marital status: Divorced    Spouse name: Not on file   Number of children: 2   Years of education: 16   Highest education level: Bachelor's degree (e.g., BA, AB, BS)  Occupational History   Occupation: Retired  Tobacco Use   Smoking status: Never   Smokeless tobacco: Never  Vaping Use   Vaping Use: Never used  Substance and Sexual Activity   Alcohol use: Yes    Alcohol/week: 14.0 standard drinks    Types: 14 Cans of beer per week   Drug use: No   Sexual activity: Yes  Other Topics Concern   Not on file  Social History Narrative   Marital status; Divorced.  Dating same male x 2000.      Children:  Lives:  Alone in 2019.      Tobacco: none; in 2019; quit in 1978.      Alcohol:  Drinks 2-3 miller lights per day.        Exercise:  Plays golf two days per week; walks around in malls.      ADLs; drives; performs ADLs; no assistant devices.      Advanced Directives:  None in 2019.  HCPOA:  Son/[employer].  FULL CODE; no prolonged measures.     Right handed    Social Determinants of Health   Financial Resource Strain: Low Risk    Difficulty of Paying Living Expenses: Not hard at all  Food Insecurity: No Food Insecurity   Worried About Charity fundraiser in the Last Year: Never true   Ran Out of Food in the Last Year: Never true  Transportation Needs: No Transportation Needs   Lack of Transportation (Medical): No   Lack of Transportation (Non-Medical): No  Physical  Activity: Insufficiently Active   Days of Exercise per Week: 3 days   Minutes of Exercise per Session: 30 min  Stress: No Stress Concern Present   Feeling of Stress : Not at all  Social Connections: Moderately Isolated   Frequency of Communication with Friends and Family: Three times a week   Frequency of Social Gatherings with Friends and Family: Three times a week   Attends Religious Services: More than 4 times per year   Active Member of Clubs or Organizations: No   Attends Archivist Meetings: Never   Marital Status: Divorced     Family History:  The patient's  family history includes Diabetes in his mother; Heart disease in his brother.   ROS:   Please see the history of present illness.    ROS All other systems reviewed and are negative.   PHYSICAL EXAM:   VS:  BP 106/66   Pulse (!) 104   Ht 5' 10.5" (1.791 m)   Wt 167 lb (75.8 kg)   SpO2 98%   BMI 23.62 kg/m   Physical Exam  GEN: Well nourished, well developed, in no acute distress  Neck: increased JVD, carotid bruits, or masses Cardiac:irreg irreg 3/6 systolic murmur  Respiratory:  bibasilar rales GI: soft, nontender, nondistended, + BS Ext: plus 2-3 edema bilaterally  Neuro:  Alert and Oriented x 3,  Psych: euthymic mood, full affect  Wt Readings from Last 3 Encounters:  10/26/21 167 lb (75.8 kg)  10/17/21 158 lb (71.7 kg)  09/26/21 160 lb 9.6 oz (72.8 kg)      Studies/Labs Reviewed:   EKG:  EKG is not ordered today.   Recent Labs: 08/11/2021: B Natriuretic Peptide 1,049.1; TSH 1.022 08/13/2021: ALT 21 08/15/2021: Magnesium 2.3 10/12/2021: BUN 21; Creatinine, Ser 1.22; Platelets 227 10/17/2021: Hemoglobin 10.9; Hemoglobin 10.9; Potassium 3.4; Potassium 3.5; Sodium 137; Sodium 137   Lipid Panel    Component Value Date/Time   CHOL 176 08/12/2021 0107   CHOL 194 01/15/2020 1529   TRIG 88 08/12/2021 0107   HDL 26 (L) 08/12/2021 0107   HDL 49 01/15/2020 1529   CHOLHDL 6.8 08/12/2021 0107    VLDL 18 08/12/2021 0107   LDLCALC 132 (H) 08/12/2021 0107   LDLCALC 125 (H) 01/15/2020 1529    Additional studies/ records that were reviewed today include:  Cath 10/17/21  Final conclusion: 1.  Severe calcification and nonobstructive plaquing throughout the RCA with severe stenoses of the RCA branches including a 90% stenosis in  the posterior AV segment and 70% stenosis in the right PDA 2.  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  Severe diffuse calcific LAD stenoses with sequential 90% stenoses in the proximal and mid vessel 4.  Severe left circumflex stenoses in the proximal and mid vessel 5.  Moderate aortic stenosis with mean transvalvular gradient 25 mmHg.  Calculated aortic valve area is not accurate in the setting of high cardiac output by Fick method. 6.  Elevated right and left-sided intracardiac pressures with a 32 mmHg V wave in the wedge pressure, 33 mmHg mean PA pressure   Recommendations: Will review with multidisciplinary heart valve team.  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    Echo 08/12/21 IMPRESSIONS     1. Left ventricular ejection fraction, by estimation, is 60 to 65%. The  left ventricle has normal function. The left ventricle demonstrates  regional wall motion abnormalities with basal inferior hypokinesis. There  is moderate left ventricular  hypertrophy. Left ventricular diastolic parameters are indeterminate.   2. Right ventricular systolic function is normal. The right ventricular  size is normal. Tricuspid regurgitation signal is inadequate for assessing  PA pressure.   3. Left atrial size was mildly dilated.   4. The mitral valve is normal in structure. Trivial mitral valve  regurgitation. No evidence of  mitral stenosis.   5. The aortic valve is tricuspid. There is severe calcifcation of the  aortic valve. Aortic valve regurgitation is moderate. Severe aortic valve  stenosis. Aortic valve mean gradient measures 58.0 mmHg.   6. The inferior vena cava is dilated in size with >50% respiratory  variability, suggesting right atrial pressure of 8 mmHg.   7. The patient was in atrial fibrillation.    Risk Assessment/Calculations:    CHA2DS2-VASc Score = 3   This indicates a 3.2% annual risk of stroke. The patient's score is based upon: CHF History: 1 HTN History: 0 Diabetes History: 0 Stroke History: 0 Vascular Disease History: 0 Age Score: 2 Gender Score: 0        ASSESSMENT:    1. Coronary artery disease involving native coronary artery of native heart without angina pectoris   2. Severe aortic stenosis   3. Acute on chronic diastolic CHF (congestive heart failure) ([place])   4. Other hyperlipidemia   5. Paroxysmal atrial fibrillation (HCC)      PLAN:  In order of problems listed above:  CAD-anomalous left main, severe diffuse LAD and Cfx disease-medical therapy-no chest pain and BP limits therapy.  Moderate-severe AS not candidate for valve repair  Acute on chronic Diastolic CHF-will increase lasix '40mg'$  bid for 4 days then once daily, add Kdur 20 meq bid for 4 days then once daily. Check labs  HLD  AFib-rates 104 on arrival. I am concerned his rates are faster with activity causing CHF. Will place monitor. Continue toprol xl 75 mg for now but may have to increase-BP will limit this.  Shared Decision Making/Informed Consent        Medication Adjustments/Labs and Tests Ordered: Current medicines are reviewed at length with the patient today.  Concerns regarding medicines are outlined above.  Medication changes, Labs and Tests ordered today are listed in the Patient Instructions below. Patient Instructions  Medication Instructions:  Your physician has recommended you  make the following change in your medication:   START: Furosemide '40mg'$  twice daily for 4 days then take '40mg'$  daily START: Potassium 20mq twice daily for 4 days then 219m daily  *If you need a refill on your cardiac medications before your next appointment, please call your pharmacy*   Lab Work: TODAY: BMET, BNP  If you have labs (blood work) drawn today and your tests are completely normal, you will receive your results only by: [place] you have MyChart) OR A paper copy in the mail If you have any lab test that is abnormal or we need to change your treatment, we will call you to review the results.   Follow-Up: [facility].  As part of our continuing mission to provide you with exceptional heart care, we have created designated Provider Care Teams.  These Care Teams include your primary Cardiologist (physician) and Advanced Practice Providers (APPs -  Physician Assistants and Nurse Practitioners) who all work together to provide you with the care you need, when you need it.  We recommend signing up for the patient portal called "MyChart".  Sign up information is provided on this After Visit Summary.  MyChart is used to connect with patients for Virtual Visits (Telemedicine).  Patients are able to view lab/test results, encounter notes, upcoming appointments, etc.  Non-urgent messages can be sent to your provider as well.   To learn more about what you can do with MyChart, go to [email or web address]   Your next appointment:   As scheduled  Other Instructions  ZIO XT- Long Term Monitor Instructions  Your physician has requested you wear a ZIO patch monitor for 14 days.  This is a single patch monitor. [employer] supplies one patch monitor per enrollment. Additional stickers are not available. Please do not apply patch if you will be having a Nuclear Stress Test,  Echocardiogram, Cardiac CT, MRI, or Chest Xray during the  period you would be wearing the  monitor. The patch cannot be worn during these tests. You cannot remove and re-apply the  ZIO XT patch monitor.  Your ZIO patch monitor will be mailed 3 day USPS to your address on file. It may take 3-5 days  to receive your monitor after you have been enrolled.  Once you have received your monitor, please review the enclosed instructions. Your monitor  has already been registered assigning a specific monitor serial # to you.  Billing and Patient Assistance Program Information  We have supplied [employer] with any of your insurance information on file for billing purposes. [employer] offers a sliding scale Patient Assistance Program for patients that do not have  insurance, or whose insurance does not completely cover the cost of the ZIO monitor.  You must apply for the Patient Assistance Program to qualify for this discounted rate.  To apply, please call [employer] at [phone] option 4, select option 2, ask to apply for  Patient Assistance Program. [name] ask your household income, and how many people  are in your household. They will quote your out-of-pocket cost based on that information.  [employer] will also be able to set up a 122-monthnterest-free payment plan if needed.  Applying the monitor   Shave hair from upper left chest.  Hold abrader disc by orange tab. Rub abrader in 40 strokes over the upper left chest as  indicated in your monitor instructions.  Clean area with 4 enclosed alcohol pads. Let dry.  Apply patch as indicated in monitor instructions. Patch will be placed under collarbone on left  side of chest with arrow pointing upward.  Rub patch adhesive wings for 2 minutes. Remove white label marked "1". Remove the white  label marked "2". Rub patch adhesive wings for 2 additional minutes.  While looking in a mirror, press and release button in center of patch. A small green light will  flash 3-4 times. This will be your only  indicator that the monitor has been turned on.  Do not shower for the first 24 hours. You may shower after the first 24 hours.  Press the button if you feel a symptom. You will hear a small click. Record Date, Time and  Symptom in the Patient Logbook.  When you are ready to remove the patch, follow instructions on the last 2 pages of Patient  Logbook. Stick patch monitor onto the last page of Patient Logbook.  Place Patient Logbook in the blue and white box. Use locking tab on box and tape box closed  securely. The blue and white box has prepaid postage on it. Please place it in the mailbox as  soon as possible. Your physician should have your test results approximately 7 days after the  monitor has been mailed back to [facility].  Call [facility] at [phone] if you have questions regarding  your ZIO XT patch monitor. Call them immediately if you see an orange light blinking on your  monitor.  If your monitor falls off in less than 4 days, contact our Monitor department at [phone].  If your monitor becomes loose or falls off after 4 days call [employer] at [phone] for  suggestions on securing your monitor  Two Gram Sodium Diet 2000 mg  What is Sodium? Sodium is a mineral found naturally in many foods. The most significant source of sodium in the diet is table salt, which is about 40% sodium.  Processed, convenience, and preserved foods also contain a large amount of sodium.  The body needs only 500 mg of sodium daily to function,  A normal diet provides more than enough sodium even if you do not use salt.  Why Limit Sodium? A build up of sodium in the body can cause thirst, increased blood pressure, shortness of breath, and water retention.  Decreasing sodium in the diet can reduce edema and risk of heart attack or stroke associated with high blood pressure.  Keep in mind that there are many other factors involved in these health problems.  Heredity, obesity,  lack of exercise, cigarette smoking, stress and what you eat all play a role.  General Guidelines: Do not add salt at the table or in cooking.  One teaspoon of salt contains over 2 grams of sodium. Read food labels Avoid processed and convenience foods Ask your dietitian before eating any foods not dicussed in the menu planning guidelines Consult your physician if you wish to use a salt substitute or a sodium containing medication such as antacids.  Limit milk and milk products to 16 oz (2 cups) per day.  Shopping Hints: READ LABELS!! "Dietetic" does not necessarily mean low sodium. Salt and other sodium ingredients are often added to foods during processing.    Menu Planning Guidelines Food Group Choose More Often Avoid  Beverages (see also the milk group All fruit juices, low-sodium, salt-free vegetables juices, low-sodium carbonated beverages Regular vegetable or tomato juices, commercially softened water used for drinking or cooking  Breads and Cereals Enriched  white, wheat, rye and pumpernickel bread, hard rolls and dinner rolls; muffins, cornbread and waffles; most dry cereals, cooked cereal without added salt; unsalted crackers and breadsticks; low sodium or homemade bread crumbs Bread, rolls and crackers with salted tops; quick breads; instant hot cereals; pancakes; commercial bread stuffing; self-rising flower and biscuit mixes; regular bread crumbs or cracker crumbs  Desserts and Sweets Desserts and sweets mad with mild should be within allowance Instant pudding mixes and cake mixes  Fats Butter or margarine; vegetable oils; unsalted salad dressings, regular salad dressings limited to 1 Tbs; light, sour and heavy cream Regular salad dressings containing bacon fat, bacon bits, and salt pork; snack dips made with instant soup mixes or processed cheese; salted nuts  Fruits Most fresh, frozen and canned fruits Fruits processed with salt or sodium-containing ingredient (some dried fruits  are processed with sodium sulfites        Vegetables Fresh, frozen vegetables and low- sodium canned vegetables Regular canned vegetables, sauerkraut, pickled vegetables, and others prepared in brine; frozen vegetables in sauces; vegetables seasoned with ham, bacon or salt pork  Condiments, Sauces, Miscellaneous  Salt substitute with physician's approval; pepper, herbs, spices; vinegar, lemon or lime juice; hot pepper sauce; garlic powder, onion powder, low sodium soy sauce (1 Tbs.); low sodium condiments (ketchup, chili sauce, mustard) in limited amounts (1 tsp.) fresh ground horseradish; unsalted tortilla chips, pretzels, potato chips, popcorn, salsa (1/4 cup) Any seasoning made with salt including garlic salt, celery salt, onion salt, and seasoned salt; sea salt, rock salt, kosher salt; meat tenderizers; monosodium glutamate; mustard, regular soy sauce, barbecue, sauce, chili sauce, teriyaki sauce, steak sauce, Worcestershire sauce, and most flavored vinegars; canned gravy and mixes; regular condiments; salted snack foods, olives, picles, relish, horseradish sauce, catsup   Food preparation: Try these seasonings Meats:    Pork Sage, onion Serve with applesauce  Chicken Poultry seasoning, thyme, parsley Serve with cranberry sauce  Lamb Curry powder, rosemary, garlic, thyme Serve with mint sauce or jelly  Veal Marjoram, basil Serve with current jelly, cranberry sauce  Beef Pepper, bay leaf Serve with dry mustard, unsalted chive butter  Fish [place] leaf, dill Serve with unsalted lemon butter, unsalted parsley butter  Vegetables:    Asparagus Lemon juice   Broccoli Lemon juice   Carrots Mustard dressing parsley, mint, nutmeg, glazed with unsalted butter and sugar   Green beans Marjoram, lemon juice, nutmeg,dill seed   Tomatoes Basil, marjoram, onion   Spice /blend for [employer]" 4 tsp ground thyme 1 tsp ground sage 3 tsp ground rosemary 4 tsp ground marjoram   Test your knowledge A  product that says "Salt Free" may still contain sodium. True or False Garlic Powder and Hot Pepper Sauce an be used as alternative seasonings.True or False Processed foods have more sodium than fresh foods.  True or False Canned Vegetables have less sodium than froze True or False   WAYS TO DECREASE YOUR SODIUM INTAKE Avoid the use of added salt in cooking and at the table.  Table salt (and other prepared seasonings which contain salt) is probably one of the greatest sources of sodium in the diet.  Unsalted foods can gain flavor from the sweet, sour, and butter taste sensations of herbs and spices.  Instead of using salt for seasoning, try the following seasonings with the foods listed.  Remember: how you use them to enhance natural food flavors is limited only by your creativity... Allspice-Meat, fish, eggs, fruit, peas, red and yellow vegetables Almond Extract-Fruit  baked goods Anise Seed-Sweet breads, fruit, carrots, beets, cottage cheese, cookies (tastes like licorice) Basil-Meat, fish, eggs, vegetables, rice, vegetables salads, soups, sauces Bay Leaf-Meat, fish, stews, poultry Burnet-Salad, vegetables (cucumber-like flavor) Caraway Seed-Bread, cookies, cottage cheese, meat, vegetables, cheese, rice Cardamon-Baked goods, fruit, soups Celery Powder or seed-Salads, salad dressings, sauces, meatloaf, soup, bread.Do not use  celery salt Chervil-Meats, salads, fish, eggs, vegetables, cottage cheese (parsley-like flavor) Chili Power-Meatloaf, chicken cheese, corn, eggplant, egg dishes Chives-Salads cottage cheese, egg dishes, soups, vegetables, sauces Cilantro-Salsa, casseroles Cinnamon-Baked goods, fruit, pork, lamb, chicken, carrots Cloves-Fruit, baked goods, fish, pot roast, green beans, beets, carrots Coriander-Pastry, cookies, meat, salads, cheese (lemon-orange flavor) Cumin-Meatloaf, fish,cheese, eggs, cabbage,fruit pie (caraway flavor) [employer], fruit, eggs, fish, poultry,  cottage cheese, vegetables Dill Seed-Meat, cottage cheese, poultry, vegetables, fish, salads, bread Fennel Seed-Bread, cookies, apples, pork, eggs, fish, beets, cabbage, cheese, Licorice-like flavor Garlic-(buds or powder) Salads, meat, poultry, fish, bread, butter, vegetables, potatoes.Do not  use garlic salt Ginger-Fruit, vegetables, baked goods, meat, fish, poultry Horseradish Root-Meet, vegetables, butter Lemon Juice or Extract-Vegetables, fruit, tea, baked goods, fish salads Mace-Baked goods fruit, vegetables, fish, poultry (taste like nutmeg) Maple Extract-Syrups Marjoram-Meat, chicken, fish, vegetables, breads, green salads (taste like Sage) Mint-Tea, lamb, sherbet, vegetables, desserts, carrots, cabbage Mustard, Dry or Seed-Cheese, eggs, meats, vegetables, poultry Nutmeg-Baked goods, fruit, chicken, eggs, vegetables, desserts Onion Powder-Meat, fish, poultry, vegetables, cheese, eggs, bread, rice salads (Do not use   Onion salt) Orange Extract-Desserts, baked goods Oregano-Pasta, eggs, cheese, onions, pork, lamb, fish, chicken, vegetables, green salads Paprika-Meat, fish, poultry, eggs, cheese, vegetables Parsley Flakes-Butter, vegetables, meat fish, poultry, eggs, bread, salads (certain forms may   Contain sodium Pepper-Meat fish, poultry, vegetables, eggs Peppermint Extract-Desserts, baked goods Poppy Seed-Eggs, bread, cheese, fruit dressings, baked goods, noodles, vegetables, cottage  [employer], poultry, meat, fish, cauliflower, turnips,eggs bread Saffron-Rice, bread, veal, chicken, fish, eggs Sage-Meat, fish, poultry, onions, eggplant, tomateos, pork, stews Savory-Eggs, salads, poultry, meat, rice, vegetables, soups, pork Tarragon-Meat, poultry, fish, eggs, butter, vegetables (licorice-like flavor)  Thyme-Meat, poultry, fish, eggs, vegetables, (clover-like flavor), sauces, soups Tumeric-Salads, butter, eggs, fish, rice, vegetables  (saffron-like flavor) Vanilla Extract-Baked goods, candy Vinegar-Salads, vegetables, meat marinades Walnut Extract-baked goods, candy   2. Choose your Foods Wisely   The following is a list of foods to avoid which are high in sodium:  Meats-Avoid all smoked, canned, salt cured, dried and kosher meat and fish as well as Anchovies   [employer] meats:Bologna, Liverwurst, Pastrami Canned meat or fish  Marinated herring Caviar    Pepperoni Corned Beef   Pizza Dried chipped beef  Salami Frozen breaded fish or meat Salt pork Frankfurters or hot dogs  Sardines Gefilte fish   Sausage Ham (boiled ham, Proscuitto Smoked butt    spiced ham)   Spam      TV Dinners Vegetables Canned vegetables (Regular) Relish Canned mushrooms  Sauerkraut Olives    Tomato juice Pickles  Bakery and Dessert Products Canned puddings  Cream pies Cheesecake   Decorated cakes Cookies  Beverages/Juices Tomato juice, regular  Gatorade   V-8 vegetable juice, regular  Breads and Cereals Biscuit mixes   Salted potato chips, corn chips, pretzels Bread stuffing mixes  Salted crackers and rolls Pancake and waffle mixes Self-rising flour  Seasonings Accent    Meat sauces Barbecue sauce  Meat tenderizer Catsup    Monosodium glutamate (MSG) Celery salt   Onion salt Chili sauce   Prepared mustard Garlic salt   Salt, seasoned salt, sea  salt Gravy mixes   Soy sauce Horseradish   Steak sauce Ketchup   Tartar sauce Lite salt    Teriyaki sauce Marinade mixes   Worcestershire sauce  Others Baking powder   Cocoa and cocoa mixes Baking soda   Commercial casserole mixes Candy-caramels, chocolate  Dehydrated soups    Bars, fudge,nougats  Instant rice and pasta mixes Canned broth or soup  Maraschino cherries Cheese, aged and processed cheese and cheese spreads  Learning Assessment Quiz  Indicated T (for True) or F (for False) for each of the following statements:  _____ Fresh fruits and vegetables  and unprocessed grains are generally low in sodium _____ Water may contain a considerable amount of sodium, depending on the source _____ You can always tell if a food is high in sodium by tasting it _____ Certain laxatives my be high in sodium and should be avoided unless prescribed   by a physician or pharmacist _____ Salt substitutes may be used freely by anyone on a sodium restricted diet _____ Sodium is present in table salt, food additives and as a natural component of   most foods _____ Table salt is approximately 90% sodium _____ Limiting sodium intake may help prevent excess fluid accumulation in the body _____ On a sodium-restricted diet, seasonings such as bouillon soy sauce, and    cooking wine should be used in place of table salt _____ On an ingredient list, a product which lists monosodium glutamate as the first   ingredient is an appropriate food to include on a low sodium diet  Circle the best answer(s) to the following statements (Hint: there may be more than one correct answer)  11. On a low-sodium diet, some acceptable snack items are:    A. Olives  F. Bean dip   K. Grapefruit juice    B. Salted Pretzels G. Commercial Popcorn   L. Canned peaches    C. Carrot Sticks  H. Bouillon   M. Unsalted nuts   D. [place] fries  I. Peanut butter crackers N. Salami   E. Sweet pickles J. Tomato Juice   O. Pizza  12.  Seasonings that may be used freely on a reduced - sodium diet include   A. Lemon wedges F.Monosodium glutamate K. Celery seed    B.Soysauce   G. Pepper   L. Mustard powder   C. Sea salt  H. Cooking wine  M. Onion flakes   D. Vinegar  E. Prepared horseradish N. Salsa   E. Sage   J. Worcestershire sauce  O. [address]       Sumner Boast, PA-C  10/26/2021 12:26 PM    [facility], [place], [place]  [id number] Phone: [phone]; Fax: [phone]

## 2021-10-27 ENCOUNTER — Telehealth: Payer: Self-pay

## 2021-10-27 DIAGNOSIS — I5033 Acute on chronic diastolic (congestive) heart failure: Secondary | ICD-10-CM

## 2021-10-27 NOTE — Telephone Encounter (Signed)
Spoke with patient's son, explained lab results and answered questions. Son will bring patient in for repeat bmet  and bnp. [facility] is unable to draw labs.

## 2021-10-27 NOTE — Telephone Encounter (Signed)
-----   Message from Imogene Burn, PA-C sent at 10/27/2021  8:12 AM EDT ----- Sodium low, kidney function normal, heart failure marker not back yet but it will be elevated based on yest exam. Low sodium can cause some confusion, disorientation. If he has these symptoms he should go to ER. Otherwise lets recheck a bmet  and bnp in 1 week to see if it improves with the increased lasix. He lives at [facility] if they can draw his labs. thanks

## 2021-10-28 ENCOUNTER — Ambulatory Visit (HOSPITAL_BASED_OUTPATIENT_CLINIC_OR_DEPARTMENT_OTHER): Payer: Medicare Other | Admitting: Family

## 2021-10-28 LAB — PRO B NATRIURETIC PEPTIDE: NT-Pro BNP: 30292 pg/mL — ABNORMAL HIGH (ref 0–486)

## 2021-10-28 LAB — BASIC METABOLIC PANEL
BUN/Creatinine Ratio: 27 — ABNORMAL HIGH (ref 10–24)
BUN: 32 mg/dL — ABNORMAL HIGH (ref 8–27)
CO2: 22 mmol/L (ref 20–29)
Calcium: 8.8 mg/dL (ref 8.6–10.2)
Chloride: 92 mmol/L — ABNORMAL LOW (ref 96–106)
Creatinine, Ser: 1.17 mg/dL (ref 0.76–1.27)
Glucose: 122 mg/dL — ABNORMAL HIGH (ref 70–99)
Potassium: 4.1 mmol/L (ref 3.5–5.2)
Sodium: 128 mmol/L — ABNORMAL LOW (ref 134–144)
eGFR: 61 mL/min/{1.73_m2} (ref >59)

## 2021-11-01 ENCOUNTER — Telehealth: Payer: Self-pay | Admitting: Family Medicine

## 2021-11-01 NOTE — Telephone Encounter (Signed)
recieved note from patient's cardiologist.  Severe aortic stenosis with poor prognosis and palliative care was recommended.  This was discussed with he and his family.  Can we please schedule a visit with me in the next 1 week to discuss plan further and place referral at that time?   That can be a video visit if that is easier.  Thanks.

## 2021-11-02 NOTE — Telephone Encounter (Signed)
Scheduled for 6/21 to discuss with Son and pt

## 2021-11-03 ENCOUNTER — Other Ambulatory Visit: Payer: Medicare Other

## 2021-11-03 DIAGNOSIS — I5033 Acute on chronic diastolic (congestive) heart failure: Secondary | ICD-10-CM

## 2021-11-04 LAB — BASIC METABOLIC PANEL
BUN/Creatinine Ratio: 26 — ABNORMAL HIGH (ref 10–24)
BUN: 45 mg/dL — ABNORMAL HIGH (ref 8–27)
CO2: 17 mmol/L — ABNORMAL LOW (ref 20–29)
Calcium: 9.1 mg/dL (ref 8.6–10.2)
Chloride: 94 mmol/L — ABNORMAL LOW (ref 96–106)
Creatinine, Ser: 1.7 mg/dL — ABNORMAL HIGH (ref 0.76–1.27)
Glucose: 141 mg/dL — ABNORMAL HIGH (ref 70–99)
Potassium: 4.7 mmol/L (ref 3.5–5.2)
Sodium: 135 mmol/L (ref 134–144)
eGFR: 39 mL/min/{1.73_m2} — ABNORMAL LOW (ref >59)

## 2021-11-04 LAB — PRO B NATRIURETIC PEPTIDE: NT-Pro BNP: 64691 pg/mL — ABNORMAL HIGH (ref 0–486)

## 2021-11-07 ENCOUNTER — Telehealth: Payer: Self-pay | Admitting: Internal Medicine

## 2021-11-07 NOTE — Telephone Encounter (Signed)
Son was returning call for results. Please advise  

## 2021-11-07 NOTE — Telephone Encounter (Signed)
Left message to call back  

## 2021-11-09 NOTE — Telephone Encounter (Signed)
Returned call to pt's Son. No answer, VM is full and couldn't leave a message.

## 2021-11-11 ENCOUNTER — Other Ambulatory Visit: Payer: Self-pay

## 2021-11-11 ENCOUNTER — Emergency Department (HOSPITAL_COMMUNITY): Payer: Medicare Other

## 2021-11-11 ENCOUNTER — Emergency Department (HOSPITAL_COMMUNITY)
Admission: EM | Admit: 2021-11-11 | Discharge: 2021-11-11 | Disposition: A | Discharge status: Home / Self Care | Payer: Medicare Other | Attending: Emergency Medicine | Admitting: Emergency Medicine

## 2021-11-11 ENCOUNTER — Encounter (HOSPITAL_COMMUNITY): Payer: Self-pay | Admitting: Emergency Medicine

## 2021-11-11 DIAGNOSIS — N179 Acute kidney failure, unspecified: Secondary | ICD-10-CM | POA: Diagnosis not present

## 2021-11-11 DIAGNOSIS — R0989 Other specified symptoms and signs involving the circulatory and respiratory systems: Secondary | ICD-10-CM

## 2021-11-11 DIAGNOSIS — R410 Disorientation, unspecified: Secondary | ICD-10-CM | POA: Diagnosis not present

## 2021-11-11 DIAGNOSIS — I350 Nonrheumatic aortic (valve) stenosis: Secondary | ICD-10-CM

## 2021-11-11 DIAGNOSIS — I1310 Hypertensive heart and chronic kidney disease without heart failure, with stage 1 through stage 4 chronic kidney disease, or unspecified chronic kidney disease: Secondary | ICD-10-CM

## 2021-11-11 DIAGNOSIS — Z515 Encounter for palliative care: Secondary | ICD-10-CM

## 2021-11-11 DIAGNOSIS — Z66 Do not resuscitate: Secondary | ICD-10-CM

## 2021-11-11 DIAGNOSIS — Z7982 Long term (current) use of aspirin: Secondary | ICD-10-CM | POA: Insufficient documentation

## 2021-11-11 DIAGNOSIS — I509 Heart failure, unspecified: Secondary | ICD-10-CM | POA: Diagnosis not present

## 2021-11-11 DIAGNOSIS — D72829 Elevated white blood cell count, unspecified: Secondary | ICD-10-CM | POA: Diagnosis not present

## 2021-11-11 DIAGNOSIS — I2510 Atherosclerotic heart disease of native coronary artery without angina pectoris: Secondary | ICD-10-CM | POA: Diagnosis not present

## 2021-11-11 DIAGNOSIS — Z7189 Other specified counseling: Secondary | ICD-10-CM | POA: Insufficient documentation

## 2021-11-11 DIAGNOSIS — N19 Unspecified kidney failure: Secondary | ICD-10-CM

## 2021-11-11 DIAGNOSIS — R0602 Shortness of breath: Secondary | ICD-10-CM | POA: Diagnosis present

## 2021-11-11 DIAGNOSIS — Z789 Other specified health status: Secondary | ICD-10-CM

## 2021-11-11 LAB — CBC WITH DIFFERENTIAL/PLATELET
Abs Immature Granulocytes: 0.14 10*3/uL — ABNORMAL HIGH (ref 0.00–0.07)
Basophils Absolute: 0 10*3/uL (ref 0.0–0.1)
Basophils Relative: 0 %
Eosinophils Absolute: 0 10*3/uL (ref 0.0–0.5)
Eosinophils Relative: 0 %
HCT: 41.4 % (ref 39.0–52.0)
Hemoglobin: 12.7 g/dL — ABNORMAL LOW (ref 13.0–17.0)
Immature Granulocytes: 1 %
Lymphocytes Relative: 8 %
Lymphs Abs: 1.4 10*3/uL (ref 0.7–4.0)
MCH: 28.8 pg (ref 26.0–34.0)
MCHC: 30.7 g/dL (ref 30.0–36.0)
MCV: 93.9 fL (ref 80.0–100.0)
Monocytes Absolute: 0.7 10*3/uL (ref 0.1–1.0)
Monocytes Relative: 4 %
Neutro Abs: 14.5 10*3/uL — ABNORMAL HIGH (ref 1.7–7.7)
Neutrophils Relative %: 87 %
Platelets: 316 10*3/uL (ref 150–400)
RBC: 4.41 MIL/uL (ref 4.22–5.81)
RDW: 16.9 % — ABNORMAL HIGH (ref 11.5–15.5)
WBC: 16.8 10*3/uL — ABNORMAL HIGH (ref 4.0–10.5)
nRBC: 0 % (ref 0.0–0.2)

## 2021-11-11 LAB — I-STAT VENOUS BLOOD GAS, ED
Acid-base deficit: 5 mmol/L — ABNORMAL HIGH (ref 0.0–2.0)
Bicarbonate: 19.8 mmol/L — ABNORMAL LOW (ref 20.0–28.0)
Calcium, Ion: 1.09 mmol/L — ABNORMAL LOW (ref 1.15–1.40)
HCT: 39 % (ref 39.0–52.0)
Hemoglobin: 13.3 g/dL (ref 13.0–17.0)
O2 Saturation: 73 %
Potassium: 4.5 mmol/L (ref 3.5–5.1)
Sodium: 140 mmol/L (ref 135–145)
TCO2: 21 mmol/L — ABNORMAL LOW (ref 22–32)
pCO2, Ven: 34.3 mmHg — ABNORMAL LOW (ref 44–60)
pH, Ven: 7.369 (ref 7.25–7.43)
pO2, Ven: 39 mmHg (ref 32–45)

## 2021-11-11 LAB — BASIC METABOLIC PANEL
Anion gap: 13 (ref 5–15)
BUN: 93 mg/dL — ABNORMAL HIGH (ref 8–23)
CO2: 21 mmol/L — ABNORMAL LOW (ref 22–32)
Calcium: 8.7 mg/dL — ABNORMAL LOW (ref 8.9–10.3)
Chloride: 106 mmol/L (ref 98–111)
Creatinine, Ser: 2.28 mg/dL — ABNORMAL HIGH (ref 0.61–1.24)
GFR, Estimated: 28 mL/min — ABNORMAL LOW (ref >60)
Glucose, Bld: 96 mg/dL (ref 70–99)
Potassium: 4.9 mmol/L (ref 3.5–5.1)
Sodium: 140 mmol/L (ref 135–145)

## 2021-11-11 LAB — BRAIN NATRIURETIC PEPTIDE: B Natriuretic Peptide: 3749.7 pg/mL — ABNORMAL HIGH (ref 0.0–100.0)

## 2021-11-11 MED ORDER — GLYCOPYRROLATE 1 MG PO TABS: 1.0000 mg | ORAL_TABLET | ORAL | Status: DC | PRN

## 2021-11-11 MED ORDER — GLYCOPYRROLATE 0.2 MG/ML IJ SOLN: 0.2000 mg | INTRAMUSCULAR | Status: DC | PRN

## 2021-11-11 MED ORDER — ONDANSETRON HCL 4 MG/2ML IJ SOLN: 4.0000 mg | Freq: Four times a day (QID) | INTRAMUSCULAR | Status: DC | PRN

## 2021-11-11 MED ORDER — GLYCOPYRROLATE 0.2 MG/ML IJ SOLN
0.4000 mg | Freq: Once | INTRAMUSCULAR | Status: AC
  Administered 2021-11-11: 0.4 mg via INTRAVENOUS
  Filled 2021-11-11: qty 2

## 2021-11-11 MED ORDER — POLYVINYL ALCOHOL 1.4 % OP SOLN
1.0000 [drp] | Freq: Four times a day (QID) | OPHTHALMIC | Status: DC | PRN
  Filled 2021-11-11: qty 15

## 2021-11-11 MED ORDER — ACETAMINOPHEN 325 MG PO TABS: 650.0000 mg | ORAL_TABLET | Freq: Four times a day (QID) | ORAL | Status: DC | PRN

## 2021-11-11 MED ORDER — ONDANSETRON 4 MG PO TBDP: 4.0000 mg | ORAL_TABLET | Freq: Four times a day (QID) | ORAL | Status: DC | PRN

## 2021-11-11 MED ORDER — BIOTENE DRY MOUTH MT LIQD: 15.0000 mL | Freq: Two times a day (BID) | OROMUCOSAL | Status: DC

## 2021-11-11 MED ORDER — LORAZEPAM 1 MG PO TABS: 1.0000 mg | ORAL_TABLET | ORAL | Status: DC | PRN

## 2021-11-11 MED ORDER — HALOPERIDOL 1 MG PO TABS
2.0000 mg | ORAL_TABLET | Freq: Four times a day (QID) | ORAL | Status: DC | PRN
  Filled 2021-11-11: qty 2

## 2021-11-11 MED ORDER — HALOPERIDOL LACTATE 5 MG/ML IJ SOLN: 2.0000 mg | Freq: Four times a day (QID) | INTRAMUSCULAR | Status: DC | PRN

## 2021-11-11 MED ORDER — LORAZEPAM 2 MG/ML IJ SOLN: 1.0000 mg | INTRAMUSCULAR | Status: DC | PRN

## 2021-11-11 MED ORDER — ACETAMINOPHEN 650 MG RE SUPP: 650.0000 mg | Freq: Four times a day (QID) | RECTAL | Status: DC | PRN

## 2021-11-11 MED ORDER — DIPHENHYDRAMINE HCL 50 MG/ML IJ SOLN: 12.5000 mg | INTRAMUSCULAR | Status: DC | PRN

## 2021-11-11 MED ORDER — HALOPERIDOL LACTATE 2 MG/ML PO CONC
2.0000 mg | Freq: Four times a day (QID) | ORAL | Status: DC | PRN
  Filled 2021-11-11: qty 5

## 2021-11-11 MED ORDER — HYDROMORPHONE HCL 1 MG/ML IJ SOLN: 0.5000 mg | INTRAMUSCULAR | Status: DC | PRN

## 2021-11-11 MED ORDER — LORAZEPAM 2 MG/ML PO CONC: 1.0000 mg | ORAL | Status: DC | PRN

## 2021-11-11 NOTE — Consult Note (Signed)
Consultation Note Date: 11/11/2021   Patient Name: Guy Hutchinson  DOB: 1935/09/26  MRN: [id number]  Age / Sex: 86 y.o., male  PCP: Wendie Agreste, MD Referring Physician: Varney Biles, MD  Reason for Consultation: Establishing goals of care, "end of life care"  HPI/Patient Profile: 86 y.o. male  with past medical history of glaucoma, severe aortic stenosis (not candidate for invasive procedures), CAD, and prostate CA presented from [facility] to ED on 11/11/21 with complains of shortness of breath and staff concerns of AMS. Patient was found to be in respiratory distress and placed on Bipap; he was subsequently able to be weaned to [facility]. PMT was consulted for concern for end of life and [place].   Clinical Assessment and Goals of Care: I have reviewed medical records including EPIC notes, labs, and imaging. Received report from primary RN - no acute concerns. RN reports patient has been lethargic, not interested in eating/drinking.    Went to visit patient at bedside - daughter/Guy Hutchinson and son/Guy Hutchinson "Guy" was present. Patient was lying in bed asleep - I did not attempt to wake him. No signs or non-verbal gestures of pain or discomfort noted. No respiratory distress, increased work of breathing, or secretions noted. He is on 2L O2 [place].  Met with Ivan Anchors, and grandson/Guy Hutchinson in ED conference room  to discuss diagnosis, prognosis, GOC, EOL wishes, disposition, and options.  I introduced Palliative Medicine as specialized medical care for people living with serious illness. It focuses on providing relief from the symptoms and stress of a serious illness. The goal is to improve quality of life for both the patient and the family.  We discussed a brief life review of the patient as well as functional and nutritional status. Patient is divorced - he has two children, Guy Hutchinson and [name]. Early March 2023, patient fell and  went to [facility] for rehab. Unfortunately, he was not able to maintain enough strength to remain at home alone and and at the end of April went to [facility] assisted living. At the end of May patient underwent heart cath and was found not a candidate for invasive procedures such as TAVR/CABG. Patient has experienced increased symptom burden over this time - now, he is only able to stand and pivot with a walker; very short distances make him "winded" where he has to sit down to catch his breath. Family explain per [facility] staff, patient has "given up." Family have noticed a decline over the last 4-6 weeks to include increased fluid retention, legs weeping, slurred words, shortness of breath. Family feel these symptoms have gotten worse over the last 2 weeks. Family explain that patient's appetite has also been decreasing and likely has not been eating/drinking enough to sustain him long term. Albumin noted at 2.8 on 08/13/21. [facility] was supposed to have an RN coming to [place] today 11/11/21 for hospice evaluation.   We discussed patient's current illness and what it means in the larger context of patient's on-going co-morbidities. Family understand that patient is not a candidate for invasive procedures for his severe CAD and AS. Discussed kidney function and education provided on concern for cardiorenal syndrome. Family have a clear understanding of patient's current acute medical situation. Natural disease trajectory and expectations at EOL were discussed. I attempted to elicit values and goals of care important to the patient. The difference between aggressive medical intervention and comfort care was considered in light of the patient's goals of care. After discussion, family are clear their  goals are not to pursue aggressive medical intervention. Therapeutic listening provided as family reflect on patient's desire not to have his life prolonged if he could not be independent. Family are  interested in comfort/hospice.  We talked about transition to comfort measures in house and what that would entail inclusive of medications to control pain, dyspnea, agitation, nausea, and itching. We discussed stopping all unnecessary measures such as blood draws, needle sticks, oxygen, antibiotics, CBGs/insulin, cardiac monitoring, IVF, and frequent vital signs. Family are agreeable to full comfort measures in house - will keep oxygen in place at this time.  Provided education and counseling at length on the philosophy and benefits of hospice care. Discussed that it offers a holistic approach to care in the setting of end-stage illness, and is about supporting the patient where they are allowing nature to take it's course. Discussed the hospice team includes RNs, physicians, social workers, and chaplains. They can provide personal care, support for the family, and help keep patient out of the hospital as well as assist with DME needs for home hospice. Education provided on the difference between home vs residential hospice. Patient is not safe to return to AL in his current state without 24/7 supervision/assistance. Family would prefer patient discharge to hospice facility. Long discussion was had on hospice facility options/preferences. Family choose to work with [facility] and request [facility].   Advance directives, concepts specific to code status, artificial feeding and hydration, and rehospitalization were considered and discussed. Family understand that without hospice services, patient is at high risk for rehospitalization. Patient's HCPOA is son/Guy; however, Guy does tell me that he prefers his sister Guy Hutchinson be primary contact for discharge coordination.  Family confirm patient's desire for DNR/DNI.   Family request continued discussion with patient. Returned to patient's bedside - he is easily arousable to voice/gentle touch. No signs or non-verbal gestures of pain or discomfort noted.  No respiratory distress or increased work of breathing; secretions noted. Patient denies pain or shortness of breath. He is alert and oriented to self and place and situation. We briefly discussed his current acute medical situation. Briefly reviewed goals of comfort/hospice care - he confirms comfort care is what he would want.   Offered chaplain support - patient declines.  Visit also consisted of discussions dealing with the complex and emotionally intense issues of symptom management and palliative care in the setting of serious and potentially life-threatening illness. Medication education was provided.   Discussed with patient/family the importance of continued conversation with each other and the medical providers regarding overall plan of care and treatment options, ensuring decisions are within the context of the patient's values and GOCs.    Questions and concerns were addressed. The patient/family was encouraged to call with questions and/or concerns. PMT card was provided.  Spoke with [facility] liaison - they do have bed available today. If patient is approved for [facility] can likely get transferred from ED to [facility].   Primary Decision Maker: HCPOA - son/Guy with assistance from his sister/Guy Hutchinson. They work together to make decisions.    SUMMARY OF RECOMMENDATIONS   Initiated full comfort measures Now DNR/DNI  Family requesting residential hospice, Cook consult placed; TOC and hospice liaison notified Patient does not require hospital admission - recommend discharge from ED to [facility]. Hospice liaison states they do have beds available - hospice evaluation currently pending Added orders for EOL symptom management and to reflect full comfort measures, as well as discontinued orders that were not  focused on comfort Unrestricted visitation orders were placed per current [place] EOL visitation policy  Nursing to provide frequent assessments and administer PRN  medications as clinically necessary to ensure EOL comfort PMT will continue to follow and support holistically  Symptom Management Dilaudid PRN pain/shortness of breath/distress/dyspnea Robinul 0.65m IV once now for respiratory secretions Tylenol PRN pain/fever Biotin twice daily Benadryl PRN itching Robinul PRN secretions Haldol PRN agitation/delirium Ativan PRN anxiety/seizure/sleep/distress Zofran PRN nausea/vomiting Liquifilm Tears PRN dry eye   Code Status/Advance Care Planning: DNR  Palliative Prophylaxis:  Aspiration, Bowel Regimen, Delirium Protocol, Frequent Pain Assessment, Oral Care, and Turn Reposition  Additional Recommendations (Limitations, Scope, Preferences): Full Comfort Care  Psycho-social/Spiritual:  Desire for further Chaplaincy support:no Created space and opportunity for patient and family to express thoughts and feelings regarding patient's current medical situation.  Emotional support and therapeutic listening provided.  Prognosis:  < 2 weeks  Discharge Planning: Hospice facility      Primary Diagnoses: Present on Admission: **None**   I have reviewed the medical record, interviewed the patient and family, and examined the patient. The following aspects are pertinent.  Past Medical History:  Diagnosis Date   Cataract    Dizziness 04/11/2017   Glaucoma    Prostate cancer ([facility]    Social History   Socioeconomic History   Marital status: Divorced    Spouse name: Not on file   Number of children: 2   Years of education: 16   Highest education level: Bachelor's degree (e.g., BA, AB, BS)  Occupational History   Occupation: Retired  Tobacco Use   Smoking status: Never   Smokeless tobacco: Never  Vaping Use   Vaping Use: Never used  Substance and Sexual Activity   Alcohol use: Yes    Alcohol/week: 14.0 standard drinks of alcohol    Types: 14 Cans of beer per week   Drug use: No   Sexual activity: Yes  Other Topics Concern   Not  on file  Social History Narrative   Marital status; Divorced.  Dating same male x 2000.      Children:      Lives:  Alone in 2019.      Tobacco: none; in 2019; quit in 1978.      Alcohol:  Drinks 2-3 miller lights per day.        Exercise:  Plays golf two days per week; walks around in malls.      ADLs; drives; performs ADLs; no assistant devices.      Advanced Directives:  None in 2019.  HCPOA:  Son/Lu[employer]man [employer]  FULL CODE; no prolonged measures.     Right handed    Social Determinants of Health   Financial Resource Strain: Low Risk  (09/29/2021)   Overall Financial Resource Strain (CARDIA)    Difficulty of Paying Living Expenses: Not hard at all  Food Insecurity: No Food Insecurity (09/29/2021)   Hunger Vital Sign    Worried About Running Out of Food in the Last Year: Never true    Ran Out of Food in the Last Year: Never true  Transportation Needs: No Transportation Needs (09/29/2021)   PRAPARE - THydrologist(Medical): No    Lack of Transportation (Non-Medical): No  Physical Activity: Insufficiently Active (09/29/2021)   Exercise Vital Sign    Days of Exercise per Week: 3 days    Minutes of Exercise per Session: 30 min  Stress: No Stress Concern Present (09/29/2021)  [employer] - Occupational Stress Questionnaire    Feeling of Stress : Not at all  Social Connections: Moderately Isolated (09/29/2021)   Social Connection and Isolation Panel [NHANES]    Frequency of Communication with Friends and Family: Three times a week    Frequency of Social Gatherings with Friends and Family: Three times a week    Attends Religious Services: More than 4 times per year    Active Member of Clubs or Organizations: No    Attends Archivist Meetings: Never    Marital Status: Divorced   Family History  Problem Relation Age of Onset   Diabetes Mother    Heart disease Brother    Scheduled Meds: Continuous Infusions: PRN  Meds:. Medications Prior to Admission:  Prior to Admission medications   Medication Sig Start Date End Date Taking? Authorizing Provider  aspirin EC 81 MG EC tablet Take 1 tablet (81 mg total) by mouth daily. Swallow whole. 08/16/21  Yes Patrecia Pour, MD  dorzolamide-timolol (COSOPT) 22.3-6.8 MG/ML ophthalmic solution Place 1 drop into both eyes 2 (two) times daily. 06/07/21  Yes [provider]  folic acid (FOLVITE) 665 MCG tablet Take 400 mcg by mouth daily.   Yes [provider]  furosemide (LASIX) 40 MG tablet Take 56m twice daily for 4 days then take 441mdaily 10/26/21  Yes LeImogene BurnPA-C  Garlic 50993G CAPS Take 500 mg by mouth daily.   Yes [provider]  latanoprost (XALATAN) 0.005 % ophthalmic solution Place 1 drop into both eyes at bedtime. 10/25/17  Yes [provider]  melatonin 5 MG TABS Take 5 mg by mouth at bedtime.   Yes [provider]  metoprolol succinate (TOPROL-XL) 25 MG 24 hr tablet Take 75 mg by mouth daily. 09/24/21  Yes [provider]  Multiple Vitamin (MULTIVITAMIN WITH MINERALS) TABS Take 1 tablet by mouth daily.   Yes [provider]  potassium chloride SA (KLOR-CON M) 20 MEQ tablet Take 2062mtwice daily for 4 days then take 73m53maily 10/26/21  Yes LenzImogene Burn-C  TURMERIC PO Take 720 mg by mouth daily.   Yes [provider]  vitamin C (ASCORBIC ACID) 500 MG tablet Take 500 mg by mouth daily.   Yes [provider]   No Known Allergies Review of Systems  Unable to perform ROS: Mental status change    Physical Exam Vitals and nursing note reviewed.  Constitutional:      General: He is not in acute distress.    Appearance: He is cachectic. He is ill-appearing.  Pulmonary:     Effort: No respiratory distress.  Skin:    General: Skin is warm and dry.  Neurological:     Mental Status: He is oriented to person, place, and time. He is lethargic.     Motor: Weakness  present.  Psychiatric:        Attention and Perception: Attention normal.        Behavior: Behavior is cooperative.        Cognition and Memory: Cognition and memory normal.     Vital Signs: BP 100/75   Pulse 96   Temp 98 F (36.7 C) (Rectal)   Resp 15   SpO2 99%  Pain Scale: 0-10   Pain Score: 0-No pain   SpO2: SpO2: 99 % O2 Device:SpO2: 99 % O2 Flow Rate: .O2 Flow Rate (L/min): 3 L/min  IO: Intake/output summary: No intake or output  data in the 24 hours ending 11/11/21 1107  LBM:   Baseline Weight:   Most recent weight:       Palliative Assessment/Data: PPS20-30%     Time In: 1115 Time Out: 1315 Time Total: 120 minutes  Greater than 50%  of this time was spent counseling and coordinating care related to the above assessment and plan.  Signed by: Lin Landsman, NP   Please contact Palliative Medicine Team phone at [phone] for questions and concerns.  For individual provider: See Amion  *Portions of this note are a verbal dictation therefore any spelling and/or grammatical errors are due to the "[employer] One" system interpretation.

## 2021-11-11 NOTE — ED Triage Notes (Signed)
Pt BIB [facility] from [facility], staff reports pt had shortness of breath, and diarrhea that started yesterday. Pt also has wounds to bilateral LE with swelling and weeping. EMS unable to obtain initial room air sat, 75% on NRB, placed on CPAP with improvement. Per staff, pt normally A&Ox4, but appears altered.

## 2021-11-11 NOTE — ED Provider Notes (Signed)
[name] EMERGENCY DEPARTMENT Provider Note   CSN: [id number] Arrival date & time: 11/11/21  0542     History  Chief Complaint  Patient presents with   Shortness of Breath   Level 5 caveat due to acuity of condition Guy Hutchinson is a 86 y.o. male.  The history is provided by the patient and the nursing home.  Shortness of Breath Severity:  Severe Onset quality:  Sudden Timing:  Constant Progression:  Worsening Chronicity:  New Relieved by: Noninvasive ventilation. Patient presents to nursing facility with shortness of breath.  EMS reports the patient was hypoxic on room air was placed on noninvasive ventilation with some improvement.     Home Medications Prior to Admission medications   Medication Sig Start Date End Date Taking? Authorizing Provider  aspirin EC 81 MG EC tablet Take 1 tablet (81 mg total) by mouth daily. Swallow whole. 08/16/21   Patrecia Pour, MD  dorzolamide-timolol (COSOPT) 22.3-6.8 MG/ML ophthalmic solution Place 1 drop into both eyes 2 (two) times daily. 06/07/21   [provider]  folic acid (FOLVITE) 144 MCG tablet Take 400 mcg by mouth daily.    [provider]  furosemide (LASIX) 40 MG tablet Take '40mg'$  twice daily for 4 days then take '40mg'$  daily 10/26/21   Imogene Burn, PA-C  Garlic 315 MG CAPS Take 500 mg by mouth daily.    [provider]  latanoprost (XALATAN) 0.005 % ophthalmic solution Place 1 drop into both eyes at bedtime. 10/25/17   [provider]  melatonin 5 MG TABS Take 5 mg by mouth at bedtime.    [provider]  metoprolol succinate (TOPROL-XL) 25 MG 24 hr tablet Take 75 mg by mouth daily. 09/24/21   [provider]  Multiple Vitamin (MULTIVITAMIN WITH MINERALS) TABS Take 1 tablet by mouth daily.    [provider]  potassium chloride SA (KLOR-CON M) 20 MEQ tablet Take 56mq twice daily for 4 days then take 264m daily 10/26/21   LeImogene BurnPA-C   TURMERIC PO Take 720 mg by mouth daily.    [provider]  vitamin C (ASCORBIC ACID) 500 MG tablet Take 500 mg by mouth daily.    [provider]      Allergies    Patient has no known allergies.    Review of Systems   Review of Systems  Unable to perform ROS: Acuity of condition  Respiratory:  Positive for shortness of breath.     Physical Exam Updated Vital Signs BP (!) 155/112 (BP Location: Right Arm)   Pulse (!) 108   Temp 98 F (36.7 C) (Rectal)   Resp (!) 22   SpO2 98%  Physical Exam CONSTITUTIONAL: Elderly, ill-appearing HEAD: Normocephalic/atraumatic EYES: EOMI/PERRL ENMT: BiPAP mask in place NECK: supple no meningeal signs SPINE/BACK:entire spine nontender CV: S1/S2 noted, heart sounds limited due to BiPAP machine LUNGS: Tachypnea, coarse breath sounds bilaterally ABDOMEN: soft, nontender NEURO: Pt is awake/alert/appropriate, moves all extremitiesx4.  No facial droop.   EXTREMITIES: pulses normal/equal, full ROM, significant pitting edema to bilateral lower extremities with weeping.  Scattered bruising noted.  His feet are cold but are well-perfused SKIN: Dry scaly skin noted PSYCH: Unable to assess  ED Results / Procedures / Treatments   Labs (all labs ordered are listed, but only abnormal results are displayed) Labs Reviewed  CBC WITH DIFFERENTIAL/PLATELET - Abnormal; Notable for the following components:      Result Value  WBC 16.8 (*)    Hemoglobin 12.7 (*)    RDW 16.9 (*)    Neutro Abs 14.5 (*)    Abs Immature Granulocytes 0.14 (*)    All other components within normal limits  BASIC METABOLIC PANEL - Abnormal; Notable for the following components:   CO2 21 (*)    BUN 93 (*)    Creatinine, Ser 2.28 (*)    Calcium 8.7 (*)    GFR, Estimated 28 (*)    All other components within normal limits  BRAIN NATRIURETIC PEPTIDE - Abnormal; Notable for the following components:   B Natriuretic Peptide 3,749.7 (*)    All other components  within normal limits  RAPID URINE DRUG SCREEN, HOSP PERFORMED  URINALYSIS, ROUTINE W REFLEX MICROSCOPIC    EKG EKG Interpretation  Date/Time:  Friday November 11 2021 05:45:18 EDT Ventricular Rate:  91 PR Interval:  46 QRS Duration: 133 QT Interval:  370 QTC Calculation: 451 R Axis:   18 Text Interpretation: rhythm indeterminate Nonspecific intraventricular conduction delay Interpretation limited secondary to artifact Artifact in lead(s) I II III aVR aVL aVF V1 V2 V3 V4 V5 V6 Confirmed by Ripley Fraise [phone]) on 11/11/2021 6:05:16 AM  Radiology DG Chest Port 1 View  Result Date: 11/11/2021 CLINICAL DATA:  86 year old male with shortness of breath. EXAM: PORTABLE CHEST 1 VIEW COMPARISON:  CTA chest 08/11/2021 and earlier. FINDINGS: Portable AP semi upright view at 0613 hours. Lung volumes have not significantly changed. Stable cardiac size and mediastinal contours. Mild cardiomegaly. Chronic hypo ventilation at the left lung base with no definite pleural effusion. No pneumothorax, pulmonary edema, or definite consolidation. No acute osseous abnormality identified. Negative visible bowel gas. IMPRESSION: Chronic left lung base hypo ventilation. No acute cardiopulmonary abnormality. Electronically Signed   By: Genevie Ann M.D.   On: 11/11/2021 06:26    Procedures .Critical Care  Performed by: Ripley Fraise, MD Authorized by: Ripley Fraise, MD   Critical care provider statement:    Critical care time (minutes):  59   Critical care start time:  11/11/2021 6:20 AM   Critical care end time:  11/11/2021 7:19 AM   Critical care time was exclusive of:  Separately billable procedures and treating other patients   Critical care was necessary to treat or prevent imminent or life-threatening deterioration of the following conditions:  Respiratory failure   Critical care was time spent personally by me on the following activities:  Obtaining history from patient or surrogate, pulse oximetry,  ordering and review of radiographic studies, ordering and review of laboratory studies, ordering and performing treatments and interventions, re-evaluation of patient's condition, review of old charts, evaluation of patient's response to treatment, development of treatment plan with patient or surrogate and examination of patient   I assumed direction of critical care for this patient from another provider in my specialty: no       Medications Ordered in ED Medications - No data to display  ED Course/ Medical Decision Making/ A&P Clinical Course as of 11/11/21 0719  Fri Nov 11, 2021  0618 Discussed with nursing staff at his senior living facility [facility].  Apparently patient was "mumbling "and was not feeling well.  He also appeared short of breath.  It is also reported that he has had recent diarrhea [DW]  0621 Patient arrived in respiratory distress on noninvasive ventilation but is improving.  Review of records reveals patient has a history of severe CAD as well as severe aortic stenosis, and due to his  underlying comorbidities he was not felt to be a candidate for repair.  Work-up is pending at this time [DW]  0701 Creatinine(!): 2.28 Acute renal failure [DW]  0718 Patient has been taken off noninvasive elation and is protecting his airway.  He is currently on nasal cannula [DW]  0718 Son is at bedside.  He reports patient does appear altered.  He is usually more alert and more talkative.  On my exam patient is awake but his speech is garbled.  He has no gross motor deficits.  Its unclear when the symptoms began.  Now that his airway has been stabilized, we will proceed with altered mental status work-up [DW]  0719 Signed out to Dr Kathrynn Humble at signout to f/u on CT/labs and admit [DW]    Clinical Course User Index [DW] Ripley Fraise, MD                           Medical Decision Making Amount and/or Complexity of Data Reviewed Labs: ordered. Decision-making details documented in ED  Course. Radiology: ordered.   This patient presents to the ED for concern of shortness of breath, this involves an extensive number of treatment options, and is a complaint that carries with it a high risk of complications and morbidity.  The differential diagnosis includes but is not limited to Acute coronary syndrome, pneumonia, acute pulmonary edema, pneumothorax, acute anemia, pulmonary embolism    Comorbidities that complicate the patient evaluation: Patient's presentation is complicated by their history of CAD and aortic stenosis  Social Determinants of Health: Patient's  immobility and lives in nursing facility   increases the complexity of managing their presentation  Additional history obtained: Additional history obtained from nursing home/care facility Records reviewed previous admission documents  Lab Tests: I Ordered, and personally interpreted labs.  The pertinent results include:  acute kidney injury, leukocytosis  Imaging Studies ordered: I ordered imaging studies including X-ray chest   I independently visualized and interpreted imaging which showed no acute findings I agree with the radiologist interpretation  Cardiac Monitoring: The patient was maintained on a cardiac monitor.  I personally viewed and interpreted the cardiac monitor which showed an underlying rhythm of:  sinus rhythm  Critical Interventions:  Noninvasive ventilation  After the interventions noted above, I reevaluated the patient and found that they have :stayed the same  Complexity of problems addressed: Patient's presentation is most consistent with  acute presentation with potential threat to life or bodily function  Disposition: After consideration of the diagnostic results and the patient's response to treatment,  I feel that the patent would benefit from admission   .           Final Clinical Impression(s) / ED Diagnoses Final diagnoses:  Delirium  AKI (acute kidney injury)  [facility])    Rx / [facility] Orders ED Discharge Orders     None         Ripley Fraise, MD 11/11/21 [phone]

## 2021-11-11 NOTE — ED Provider Notes (Signed)
Physical Exam  BP 93/79   Pulse (!) 57   Temp 98 F (36.7 C) (Rectal)   Resp (!) 21   SpO2 100%   Physical Exam  Procedures  .Critical Care  Performed by: Varney Biles, MD Authorized by: Varney Biles, MD   Critical care provider statement:    Critical care time (minutes):  32   Critical care was necessary to treat or prevent imminent or life-threatening deterioration of the following conditions:  CNS failure or compromise and renal failure   Critical care was time spent personally by me on the following activities:  Development of treatment plan with patient or surrogate, discussions with consultants, evaluation of patient's response to treatment, examination of patient, ordering and review of laboratory studies, ordering and review of radiographic studies, ordering and performing treatments and interventions, pulse oximetry, re-evaluation of patient's condition and review of old charts   ED Course / MDM   Clinical Course as of 11/11/21 0931  Fri Nov 11, 2021  0618 Discussed with nursing staff at his senior living facility [facility].  Apparently patient was "mumbling "and was not feeling well.  He also appeared short of breath.  It is also reported that he has had recent diarrhea [DW]  0621 Patient arrived in respiratory distress on noninvasive ventilation but is improving.  Review of records reveals patient has a history of severe CAD as well as severe aortic stenosis, and due to his underlying comorbidities he was not felt to be a candidate for repair.  Work-up is pending at this time [DW]  0701 Creatinine(!): 2.28 Acute renal failure [DW]  0718 Patient has been taken off noninvasive elation and is protecting his airway.  He is currently on nasal cannula [DW]  0718 Son is at bedside.  He reports patient does appear altered.  He is usually more alert and more talkative.  On my exam patient is awake but his speech is garbled.  He has no gross motor deficits.  Its unclear when  the symptoms began.  Now that his airway has been stabilized, we will proceed with altered mental status work-up [DW]  0719 Signed out to Dr Kathrynn Humble at signout to f/u on CT/labs and admit [DW]    Clinical Course User Index [DW] Ripley Fraise, MD   Medical Decision Making Amount and/or Complexity of Data Reviewed Labs: ordered. Decision-making details documented in ED Course. Radiology: ordered.   Patient was primarily seen by Julious Oka. Patient had come into the ER with chief complaint of shortness of breath, placed on BiPAP.  He currently resides at nursing home.  BiPAP was discontinued.  Later on, it was uncovered that in addition to shortness of breath, there is concerns for worsening leg swelling and also some confusion.  Son at the bedside now.  He informs me that patient is sounding slurred.  He was sounding slurred yesterday over the phone as well.  Also he is weaker than usual.  Normally he is able to ambulate, that is not something he is able to do right now.  Patient is not fit for assisted living type facility.  We went over patient's recent work-up.  It appears that he was having CHF-like symptoms.  Admission to the hospital revealed significant aortic stenosis and additional work-up noted that he had triple-vessel disease.  He is not amenable to any invasive cardiac intervention including TAVR.  Son indicates that hospice was actually going to enroll him at 10 AM today.  On exam, patient does have slight  right eye disconjugate gaze, left upper extremity has some contraction, he is slurred.  Questioning if patient actually had a stroke.  White count is slightly elevated.  We still have not received urine yet.   Labs reveal acute on chronic renal failure, BUN is now in the 90s. It is abundantly clear that patient is nearing end-of-life.  Hospice discussion already in place, which is reassuring and appropriate.  We will consult medicine for admission.  I discussed the  case with medicine, at this time they do not think cardiology service will be very helpful, they will consult cardiology if conversation with family requires that level of care.  They will take over the diuresing aspect.  Urine can be collected once patient gets Lasix. NO HX OF UTI.   Son at the bedside.  He is understanding.         Varney Biles, MD 11/11/21 [phone]

## 2021-11-11 NOTE — Progress Notes (Signed)
RT NOTE:  Pt taken off BIPAP per MD request. Pt placed on 3L [place]

## 2021-11-11 NOTE — ED Notes (Addendum)
All discharge instructions and DNR form given to [facility] prior to patient being transported over to facility.

## 2021-11-11 NOTE — Progress Notes (Signed)
Manufacturing engineer [facility])  All necessary consents completed and transport to [facility] can be arranged.  Updated hospital staff via epic chat.  Thank you, Venia Carbon DNP, RN [facility] Liaison

## 2021-11-11 NOTE — Discharge Planning (Signed)
RNCM following for disposition needs.  Plan: discharge to [facility] for full comfort care. Guy Hutchinson J. Clydene Laming, [place], [place], [place]

## 2021-11-11 NOTE — Progress Notes (Signed)
Engineer, maintenance [facility]) Hospital Liaison note.   Received request from [place] for family interest in [facility] with request for transfer  today. Chart reviewed and eligibility confirmed.   Spoke with family to confirm interest and explain services. Family agreeable to transfer today. CSW aware.    Registration paperwork will need to be completed prior to transport.     RN please call report to [phone].   Please arrange transport for patient once consents are complete.   Thank you,   Clementeen Hoof, DNP, RN   [place] (listed on [facility] under Hospice and [place] [facility] [place]   [phone]

## 2021-11-11 NOTE — Discharge Planning (Signed)
RNCM notified [place], Esther Hardy with [facility]) and [place] of pt disposition to residential hospice.

## 2021-11-11 NOTE — Consult Note (Signed)
ER Consult Note   Patient: Guy Hutchinson PXT:[id number] DOB: [birth year] DOA: 11/11/2021 DOS: the patient was seen and examined on 11/11/2021 PCP: Wendie Agreste, MD  Patient coming from: ALF/ILF - lives in [place] (not memory care); NOK: Son, [phone]; Daughter, [phone]   Chief Complaint: SOB  HPI: Guy Hutchinson is a 86 y.o. male with medical history significant of glaucoma, severe AS, and prostate CA presenting with SOB.  His son reports that March 5, he left his son's home (watching golf, drove home) and he didn't answer calls.  He had 7 steps to his house, tried to step into the house and fell down the stairs.  They came to the ER and no fractures, sent home.  He was home 10 days, very bruised.  His legs started weeping.  They brought him back into the hospital and he was in afib.  He was diagnosed with severe AS and CHF.  He was given rate controlling meds.  He was sent to SNF rehab at [facility] and then moved into [place].  He went for cath to look for TAVR but he has severe CAD and he is not considered a candidate for CABG/TAVR.  He is being controlled symptomatically.  He has progressed from walker to wheelchair.  Guy Hutchinson thinks he has given up, suggesting that they call in hospice.  He is lucid during the day but sundowns after about 4pm.  Last night, he called and seemed groggy, thought he was sundowning.  He called his daughter later and she couldn't understand.  [place] went to check on him, his legs were weeping, speech garbled, and they were calling 911.    ER Course:  ES AS and CAD, hospice enrollment today.  Too sick for ALF.  Legs weeping, slurred speech, SOB on BIPAP.  Now on RA.  CT negative, delirium.  Acute on chronic renal failure.  Cards to see.     Review of Systems: unable to review all systems due to the inability of the patient to answer questions. Past Medical History:  Diagnosis Date   Cataract    Dizziness 04/11/2017   Glaucoma     Prostate cancer [facility])    Past Surgical History:  Procedure Laterality Date   PROSTATE SURGERY  2007   Impant   RIGHT/LEFT HEART CATH AND CORONARY ANGIOGRAPHY N/A 10/17/2021   Procedure: RIGHT/LEFT HEART CATH AND CORONARY ANGIOGRAPHY;  Surgeon: Sherren Mocha, MD;  Location: [place] [facility];  Service: Cardiovascular;  Laterality: N/A;   Social History:  reports that he has never smoked. He has never used smokeless tobacco. He reports current alcohol use of about 14.0 standard drinks of alcohol per week. He reports that he does not use drugs.  No Known Allergies  Family History  Problem Relation Age of Onset   Diabetes Mother    Heart disease Brother     Prior to Admission medications   Medication Sig Start Date End Date Taking? Authorizing Provider  aspirin EC 81 MG EC tablet Take 1 tablet (81 mg total) by mouth daily. Swallow whole. 08/16/21  Yes Patrecia Pour, MD  dorzolamide-timolol (COSOPT) 22.3-6.8 MG/ML ophthalmic solution Place 1 drop into both eyes 2 (two) times daily. 06/07/21  Yes [provider]  folic acid (FOLVITE) 967 MCG tablet Take 400 mcg by mouth daily.   Yes [provider]  furosemide (LASIX) 40 MG tablet Take '40mg'$  twice daily for 4 days then take '40mg'$  daily 10/26/21  Yes Ermalinda Barrios  M, PA-C  Garlic 563 MG CAPS Take 500 mg by mouth daily.   Yes [provider]  latanoprost (XALATAN) 0.005 % ophthalmic solution Place 1 drop into both eyes at bedtime. 10/25/17  Yes [provider]  melatonin 5 MG TABS Take 5 mg by mouth at bedtime.   Yes [provider]  metoprolol succinate (TOPROL-XL) 25 MG 24 hr tablet Take 75 mg by mouth daily. 09/24/21  Yes [provider]  Multiple Vitamin (MULTIVITAMIN WITH MINERALS) TABS Take 1 tablet by mouth daily.   Yes [provider]  potassium chloride SA (KLOR-CON M) 20 MEQ tablet Take 59mq twice daily for 4 days then take 270m daily 10/26/21  Yes LeImogene BurnPA-C   TURMERIC PO Take 720 mg by mouth daily.   Yes [provider]  vitamin C (ASCORBIC ACID) 500 MG tablet Take 500 mg by mouth daily.   Yes [provider]    Physical Exam: Vitals:   11/11/21 1130 11/11/21 1200 11/11/21 1230 11/11/21 1400  BP: (!) 102/59 110/63 103/84 100/61  Pulse: 91 (!) 25 (!) 46 (!) 103  Resp: '16 15 16   '$ Temp:      TempSrc:      SpO2: 98% 96% 100% 98%   General:  Appears frail, cachectic, chronically ill Eyes:  EOMI, normal lids, iris ENT:  grossly normal hearing, lips & tongue, mmm Neck:  no LAD, masses or thyromegaly Cardiovascular:  RRR, no m/r/g. 2+ LE edema.  Respiratory:   Scattered rhonchi, mostly upper airway noise.  Normal to mildly increased respiratory effort. Abdomen:  soft, NT, ND Skin:  no rash or induration seen on limited exam Musculoskeletal:  mildly decreased tone BUE/BLE, no bony abnormality Psychiatric:  blunted mood and affect, speech mildly dysarthric but appropriate, AOx2 Neurologic:  unable to effectively perform   Radiological Exams on Admission: Independently reviewed - see discussion in A/P where applicable  CT HEAD WO CONTRAST  Result Date: 11/11/2021 CLINICAL DATA:  Delirium EXAM: CT HEAD WITHOUT CONTRAST TECHNIQUE: Contiguous axial images were obtained from the base of the skull through the vertex without intravenous contrast. RADIATION DOSE REDUCTION: This exam was performed according to the departmental dose-optimization program which includes automated exposure control, adjustment of the mA and/or kV according to patient size and/or use of iterative reconstruction technique. COMPARISON:  CT head 08/11/2021 FINDINGS: Brain: There is no acute intracranial hemorrhage, extra-axial fluid collection, or acute infarct. There is mild global parenchymal volume loss with prominence of the ventricular system and extra-axial CSF spaces. Gray-white differentiation is preserved. Confluent hypodensity in the subcortical and  periventricular white matter likely reflects sequela of chronic white matter microangiopathy. There is no mass lesion.  There is no mass effect or midline shift. Vascular: There is calcification of the bilateral cavernous ICAs. Skull: Normal. Negative for fracture or focal lesion. Sinuses/Orbits: There is new layering fluid in the right maxillary sinus. The globes and orbits are unremarkable. Other: None. IMPRESSION: 1. No acute intracranial pathology. 2. New layering fluid in the right maxillary sinus which can be seen in the setting of acute sinusitis in the correct clinical setting. Electronically Signed   By: PeValetta Mole.D.   On: 11/11/2021 08:14   DG Chest Port 1 View  Result Date: 11/11/2021 CLINICAL DATA:  [age]ear old male with shortness of breath. EXAM: PORTABLE CHEST 1 VIEW COMPARISON:  CTA chest 08/11/2021 and earlier. FINDINGS: Portable AP semi upright view at 0613 hours. Lung volumes have not significantly changed. Stable  cardiac size and mediastinal contours. Mild cardiomegaly. Chronic hypo ventilation at the left lung base with no definite pleural effusion. No pneumothorax, pulmonary edema, or definite consolidation. No acute osseous abnormality identified. Negative visible bowel gas. IMPRESSION: Chronic left lung base hypo ventilation. No acute cardiopulmonary abnormality. Electronically Signed   By: Genevie Ann M.D.   On: 11/11/2021 06:26    EKG: Independently reviewed.  Indeterminate rhythm with rate 91; IVCD; nonspecific ST changes, significant artifact   Labs on Admission: I have personally reviewed the available labs and imaging studies at the time of the admission.  Pertinent labs:    VBG; 7.369/34.3/19.8 BUN 93/Creatinine 2.28/GFR 28; 45/1.7/39 on 6/8, 32/1.17/61 on 5/31 BNP 3749.7 WBC 16.8   Assessment and Plan: Principal Problem:   Cardiorenal syndrome with renal failure Active Problems:   Severe aortic stenosis   Coronary artery disease    Cardiorenal syndrome with  acute respiratory failure -Patient with known end-stage CAD and AS -Now presenting with volume overload and progressive renal failure -TRH was called for admission but based on overall evaluation the patient is appropriate for residential hospice -Palliative care was consulted and family agreed with admission to [place], not contributing to current presentation   Glaucoma -He is unlikely to need Cosopt, latanoprost       Thank you for this interesting consult.  Based on his terminal conditions including CAD and AS as well as progressive renal failure, the patient appears to be nearing the end of his life.  As such, goals of care discussion was held with son and palliative care was consulted for possible residential hospice admission.  Family has decided to proceed with end of life care and there is currently a bed available at [facility].  He will be transferred there at this time.  TR[facility] will sign off - please call back if needed.   Author: Karmen Bongo, MD 11/11/2021 3:33 PM  For on call review [email or web address].

## 2021-11-11 NOTE — ED Notes (Signed)
This NT attempted I/O cath and could not collect urine.

## 2021-11-16 ENCOUNTER — Telehealth: Payer: Medicare Other | Admitting: Family Medicine

## 2021-11-26 DEATH — deceased

## 2021-12-19 ENCOUNTER — Ambulatory Visit: Payer: Medicare Other | Admitting: Internal Medicine

## 2021-12-28 ENCOUNTER — Encounter: Payer: Medicare Other | Admitting: Surgery
